# Patient Record
Sex: Male | Born: 1955 | Race: White | Hispanic: No | Marital: Single | Smoking: Never smoker
Health system: Southern US, Community
[De-identification: ages and names within clinical notes are randomized; demographics above are authoritative.]

## PROBLEM LIST (undated history)

## (undated) DIAGNOSIS — I10 Essential (primary) hypertension: Secondary | ICD-10-CM

## (undated) DIAGNOSIS — I2510 Atherosclerotic heart disease of native coronary artery without angina pectoris: Secondary | ICD-10-CM

## (undated) DIAGNOSIS — E119 Type 2 diabetes mellitus without complications: Secondary | ICD-10-CM

## (undated) DIAGNOSIS — E785 Hyperlipidemia, unspecified: Secondary | ICD-10-CM

## (undated) DIAGNOSIS — E669 Obesity, unspecified: Secondary | ICD-10-CM

## (undated) DIAGNOSIS — G20 Parkinson's disease: Secondary | ICD-10-CM

## (undated) DIAGNOSIS — R2681 Unsteadiness on feet: Secondary | ICD-10-CM

## (undated) DIAGNOSIS — R1310 Dysphagia, unspecified: Secondary | ICD-10-CM

## (undated) DIAGNOSIS — Z9181 History of falling: Secondary | ICD-10-CM

## (undated) DIAGNOSIS — R413 Other amnesia: Secondary | ICD-10-CM

## (undated) DIAGNOSIS — R634 Abnormal weight loss: Secondary | ICD-10-CM

## (undated) DIAGNOSIS — E7800 Pure hypercholesterolemia, unspecified: Secondary | ICD-10-CM

## (undated) DIAGNOSIS — F985 Adult onset fluency disorder: Secondary | ICD-10-CM

## (undated) HISTORY — DX: Adult onset fluency disorder: F98.5

## (undated) HISTORY — DX: Pure hypercholesterolemia, unspecified: E78.00

## (undated) HISTORY — DX: Essential (primary) hypertension: I10

## (undated) HISTORY — DX: Other amnesia: R41.3

## (undated) HISTORY — DX: Dysphagia, unspecified: R13.10

## (undated) HISTORY — DX: Atherosclerotic heart disease of native coronary artery without angina pectoris: I25.10

## (undated) HISTORY — DX: Type 2 diabetes mellitus without complications: E11.9

## (undated) HISTORY — DX: Abnormal weight loss: R63.4

## (undated) HISTORY — DX: Parkinson's disease: G20

## (undated) HISTORY — DX: History of falling: Z91.81

## (undated) HISTORY — DX: Unsteadiness on feet: R26.81

## (undated) HISTORY — PX: CARDIAC CATHETERIZATION: SHX172

## (undated) HISTORY — DX: Obesity, unspecified: E66.9

## (undated) HISTORY — DX: Hyperlipidemia, unspecified: E78.5

---

## 2008-09-28 ENCOUNTER — Emergency Department: Admit: 2008-09-28 | Payer: Self-pay | Source: Emergency Department | Admitting: Emergency Medicine

## 2008-09-28 LAB — CBC AND DIFFERENTIAL
Basophils Absolute: 0 /mm3 (ref 0.0–0.2)
Basophils: 0 % (ref 0–2)
Eosinophils Absolute: 0 /mm3 (ref 0.0–0.7)
Eosinophils: 0 % (ref 0–5)
Granulocytes Absolute: 9.8 /mm3 — ABNORMAL HIGH (ref 1.8–8.1)
Hematocrit: 45.1 % (ref 42.0–52.0)
Hgb: 15.5 G/DL (ref 13.0–17.0)
Immature Granulocytes Absolute: 0
Immature Granulocytes: 0 %
Lymphocytes Absolute: 0.5 /mm3 (ref 0.5–4.4)
Lymphocytes: 4 % — ABNORMAL LOW (ref 15–41)
MCH: 29 PG (ref 28.0–32.0)
MCHC: 34.4 G/DL (ref 32.0–36.0)
MCV: 84.5 FL (ref 80.0–100.0)
MPV: 10.9 FL (ref 9.4–12.3)
Monocytes Absolute: 0.6 /mm3 (ref 0.0–1.2)
Monocytes: 5 % (ref 0–11)
Neutrophils %: 90 % — ABNORMAL HIGH (ref 52–75)
Platelets: 297 /mm3 (ref 140–400)
RBC: 5.34 /mm3 (ref 4.70–6.00)
RDW: 13.8 % (ref 11.5–15.0)
WBC: 10.8 /mm3 (ref 3.50–10.80)

## 2008-09-28 LAB — I-STAT CG4 VENOUS CARTRIDGE
Base Excess, Ven: 7 mEq/L
FIO2: 21
HCO3, Ven: 31.3 mEq/L
Lactic Acid: 2 mEq/L (ref 0.5–2.2)
O2 Sat, Venous: 39 %
Temperature: 98.9
Venous Total CO2: 32 mEq/L
pCO2, Venous: 40.4 mmHg
pH, Ven: 7.497
pO2, Venous: 21 mmHg

## 2008-09-28 LAB — BASIC METABOLIC PANEL
BUN: 19 mg/dL (ref 8–20)
CO2: 29 mEq/L (ref 21–30)
Calcium: 9.5 mg/dL (ref 8.6–10.2)
Chloride: 101 mEq/L (ref 98–107)
Creatinine: 1 mg/dL (ref 0.6–1.5)
Glucose: 147 mg/dL — ABNORMAL HIGH (ref 70–100)
Potassium: 3.2 mEq/L — ABNORMAL LOW (ref 3.6–5.0)
Sodium: 142 mEq/L (ref 136–146)

## 2008-09-28 LAB — GFR

## 2008-09-28 LAB — HEPATIC FUNCTION PANEL
ALT: 33 U/L (ref 3–36)
AST (SGOT): 25 U/L (ref 10–41)
Albumin/Globulin Ratio: 1.2 (ref 1.1–1.8)
Albumin: 4.7 g/dL (ref 3.4–4.9)
Alkaline Phosphatase: 105 U/L (ref 43–112)
Bilirubin Direct: 0 mg/dL (ref 0.0–0.3)
Bilirubin Indirect: 1 mg/dL — ABNORMAL HIGH (ref 0.1–0.9)
Bilirubin, Total: 0.7 mg/dL (ref 0.1–1.0)
Globulin: 3.8 g/dL — ABNORMAL HIGH (ref 2.0–3.7)
Protein, Total: 8.5 g/dL — ABNORMAL HIGH (ref 6.0–8.0)

## 2008-09-28 LAB — MAGNESIUM: Magnesium: 2 mg/dL (ref 1.6–2.3)

## 2008-09-28 LAB — LIPASE: Lipase: 82 U/L (ref 32–219)

## 2008-09-28 LAB — AMYLASE: Amylase: 36 U/L (ref 5–90)

## 2008-09-28 LAB — OSMOLALITY: Osmolality: 301 mosm/kg — ABNORMAL HIGH (ref 270–298)

## 2010-09-17 HISTORY — PX: CAROTID STENT: SHX1301

## 2011-06-08 LAB — ECG 12-LEAD
Atrial Rate: 91 {beats}/min
P Axis: 115 degrees
P-R Interval: 158 ms
Q-T Interval: 368 ms
QRS Duration: 104 ms
QTC Calculation (Bezet): 452 ms
R Axis: -28 degrees
T Axis: -24 degrees
Ventricular Rate: 91 {beats}/min

## 2013-02-26 ENCOUNTER — Emergency Department: Payer: BLUE CROSS/BLUE SHIELD

## 2013-02-26 ENCOUNTER — Inpatient Hospital Stay
Admission: EM | Admit: 2013-02-26 | Discharge: 2013-02-28 | DRG: 247 | Disposition: A | Discharge status: Home / Self Care | Payer: BLUE CROSS/BLUE SHIELD | Attending: Hospitalist | Admitting: Hospitalist

## 2013-02-26 ENCOUNTER — Inpatient Hospital Stay: Payer: BLUE CROSS/BLUE SHIELD | Admitting: Internal Medicine

## 2013-02-26 DIAGNOSIS — Z6838 Body mass index (BMI) 38.0-38.9, adult: Secondary | ICD-10-CM

## 2013-02-26 DIAGNOSIS — E785 Hyperlipidemia, unspecified: Secondary | ICD-10-CM | POA: Diagnosis present

## 2013-02-26 DIAGNOSIS — E669 Obesity, unspecified: Secondary | ICD-10-CM | POA: Diagnosis present

## 2013-02-26 DIAGNOSIS — I2510 Atherosclerotic heart disease of native coronary artery without angina pectoris: Secondary | ICD-10-CM | POA: Diagnosis present

## 2013-02-26 DIAGNOSIS — E119 Type 2 diabetes mellitus without complications: Secondary | ICD-10-CM | POA: Diagnosis present

## 2013-02-26 DIAGNOSIS — I214 Non-ST elevation (NSTEMI) myocardial infarction: Principal | ICD-10-CM | POA: Diagnosis present

## 2013-02-26 DIAGNOSIS — I10 Essential (primary) hypertension: Secondary | ICD-10-CM | POA: Diagnosis present

## 2013-02-26 LAB — CBC AND DIFFERENTIAL
Basophils Absolute Automated: 0.02 (ref 0.00–0.20)
Basophils Automated: 0 %
Eosinophils Absolute Automated: 0.14 (ref 0.00–0.70)
Eosinophils Automated: 2 %
Hematocrit: 44.8 % (ref 42.0–52.0)
Hgb: 14.4 g/dL (ref 13.0–17.0)
Immature Granulocytes Absolute: 0.01
Immature Granulocytes: 0 %
Lymphocytes Absolute Automated: 1.98 (ref 0.50–4.40)
Lymphocytes Automated: 28 %
MCH: 28.1 pg (ref 28.0–32.0)
MCHC: 32.1 g/dL (ref 32.0–36.0)
MCV: 87.5 fL (ref 80.0–100.0)
MPV: 11.3 fL (ref 9.4–12.3)
Monocytes Absolute Automated: 0.52 (ref 0.00–1.20)
Monocytes: 7 %
Neutrophils Absolute: 4.36 (ref 1.80–8.10)
Neutrophils: 62 %
Nucleated RBC: 0 (ref 0–1)
Platelets: 225 (ref 140–400)
RBC: 5.12 (ref 4.70–6.00)
RDW: 14 % (ref 12–15)
WBC: 7.03 (ref 3.50–10.80)

## 2013-02-26 LAB — POCT GLUCOSE: Whole Blood Glucose POCT: 157 mg/dL — AB (ref 70–100)

## 2013-02-26 LAB — BASIC METABOLIC PANEL
BUN: 13 mg/dL (ref 9.0–21.0)
CO2: 24 (ref 22–29)
Calcium: 9.1 mg/dL (ref 8.5–10.5)
Chloride: 104 (ref 98–107)
Creatinine: 0.9 mg/dL (ref 0.7–1.3)
Glucose: 274 mg/dL — ABNORMAL HIGH (ref 70–100)
Potassium: 3.8 (ref 3.5–5.1)
Sodium: 137 (ref 136–145)

## 2013-02-26 LAB — GFR: EGFR: >60

## 2013-02-26 LAB — I-STAT TROPONIN: i-STAT Troponin: 0.07 ng/mL (ref 0.00–0.09)

## 2013-02-26 MED ORDER — GLUCOSE 40 % PO GEL
15.0000 g | ORAL | Status: DC | PRN
Start: 2013-02-26 — End: 2013-02-28

## 2013-02-26 MED ORDER — DEXTROSE 50 % IV SOLN
25.0000 mL | INTRAVENOUS | Status: DC | PRN
Start: 2013-02-26 — End: 2013-02-28

## 2013-02-26 MED ORDER — INSULIN ASPART 100 UNIT/ML SC SOLN
1.00 [IU] | Freq: Three times a day (TID) | SUBCUTANEOUS | Status: DC | PRN
Start: 2013-02-26 — End: 2013-02-28
  Administered 2013-02-28: 1 [IU] via SUBCUTANEOUS
  Filled 2013-02-26: qty 10

## 2013-02-26 MED ORDER — DEXTROSE 50 % IV SOLN
25.00 mL | INTRAVENOUS | Status: DC | PRN
Start: 2013-02-26 — End: 2013-02-28

## 2013-02-26 MED ORDER — METOPROLOL TARTRATE 25 MG PO TABS
25.0000 mg | ORAL_TABLET | Freq: Two times a day (BID) | ORAL | Status: DC
Start: 2013-02-26 — End: 2013-02-27
  Administered 2013-02-26 – 2013-02-27 (×2): 25 mg via ORAL
  Filled 2013-02-26 (×2): qty 1

## 2013-02-26 MED ORDER — GLUCAGON HCL (RDNA) 1 MG IJ SOLR
1.0000 mg | INTRAMUSCULAR | Status: DC | PRN
Start: 2013-02-26 — End: 2013-02-28

## 2013-02-26 MED ORDER — LABETALOL HCL 5 MG/ML IV SOLN
20.0000 mg | Freq: Once | INTRAVENOUS | Status: DC
Start: 2013-02-26 — End: 2013-02-26

## 2013-02-26 MED ORDER — SODIUM CHLORIDE 0.9 % IJ SOLN
3.0000 mL | Freq: Three times a day (TID) | INTRAMUSCULAR | Status: DC
Start: 2013-02-26 — End: 2013-02-28
  Administered 2013-02-26 – 2013-02-28 (×6): 3 mL via INTRAVENOUS

## 2013-02-26 MED ORDER — LABETALOL HCL 5 MG/ML IV SOLN
10.0000 mg | Freq: Once | INTRAVENOUS | Status: AC
Start: 2013-02-26 — End: 2013-02-26
  Administered 2013-02-26: 10 mg via INTRAVENOUS
  Filled 2013-02-26: qty 20

## 2013-02-26 MED ORDER — SODIUM CHLORIDE 0.9 % IV SOLN
INTRAVENOUS | Status: DC
Start: 2013-02-27 — End: 2013-02-28

## 2013-02-26 MED ORDER — OXYCODONE-ACETAMINOPHEN 5-325 MG PO TABS
2.00 | ORAL_TABLET | ORAL | Status: DC | PRN
Start: 2013-02-26 — End: 2013-02-28

## 2013-02-26 MED ORDER — LABETALOL HCL 5 MG/ML IV SOLN
10.0000 mg | Freq: Once | INTRAVENOUS | Status: AC
Start: 2013-02-26 — End: 2013-02-26
  Administered 2013-02-26: 10 mg via INTRAVENOUS

## 2013-02-26 MED ORDER — PANTOPRAZOLE SODIUM 40 MG PO TBEC
40.0000 mg | DELAYED_RELEASE_TABLET | Freq: Every morning | ORAL | Status: DC
Start: 2013-02-27 — End: 2013-02-28
  Administered 2013-02-27 – 2013-02-28 (×2): 40 mg via ORAL
  Filled 2013-02-26 (×2): qty 1

## 2013-02-26 MED ORDER — NITROGLYCERIN 0.4 MG SL SUBL
0.4000 mg | SUBLINGUAL_TABLET | SUBLINGUAL | Status: DC | PRN
Start: 2013-02-26 — End: 2013-02-28

## 2013-02-26 MED ORDER — ACETAMINOPHEN 325 MG PO TABS
650.0000 mg | ORAL_TABLET | ORAL | Status: DC | PRN
Start: 2013-02-26 — End: 2013-02-28
  Administered 2013-02-27: 650 mg via ORAL
  Filled 2013-02-26: qty 2

## 2013-02-26 MED ORDER — HYDRALAZINE HCL 20 MG/ML IJ SOLN
10.0000 mg | INTRAMUSCULAR | Status: DC | PRN
Start: 2013-02-26 — End: 2013-02-27

## 2013-02-26 MED ORDER — ENOXAPARIN SODIUM 40 MG/0.4ML SC SOLN
40.0000 mg | Freq: Every day | SUBCUTANEOUS | Status: DC
Start: 2013-02-26 — End: 2013-02-27
  Administered 2013-02-26: 40 mg via SUBCUTANEOUS
  Filled 2013-02-26: qty 0.4

## 2013-02-26 MED ORDER — INSULIN ASPART 100 UNIT/ML SC SOLN
1.0000 [IU] | Freq: Three times a day (TID) | SUBCUTANEOUS | Status: DC | PRN
Start: 2013-02-26 — End: 2013-02-26

## 2013-02-26 NOTE — ED Notes (Signed)
Bed:N 42<BR> Expected date:<BR> Expected time:<BR> Means of arrival:[id number] [place]<BR> Comments:<BR>

## 2013-02-26 NOTE — ED Provider Notes (Signed)
Physician/Midlevel provider first contact with patient: 02/26/13 1636         Physician/Midlevel provider first contact with patient: 4:48 PM    Chief Complaint  Chief Complaint   Patient presents with   . Chest Pain   . Shortness of Breath       Diagnosis   1. Chest pain        Disposition   ED Disposition     Admit Bed Type: Telemetry [5]  Admitting Physician: Candiss Norse [[id number]]  Patient Class: Hospital Outpatient Surgery (Amb Proc) [106]             Meds administered in ER:     Medications   labetalol (NORMODYNE,TRANDATE) injection 10 mg (10 mg Intravenous Given 02/26/13 1824)   labetalol (NORMODYNE,TRANDATE) injection 10 mg (10 mg Intravenous Given 02/26/13 1857)     New Prescriptions    No medications on file      __________________________________________________   HPI   William Donaldson is a 57 y.o. male h/o HTN, borderline DM, BIBA c/o mid-sternal chest pressure onset approx 1430 today when pt woke up from a nap. Pt mentions assoc "clammy" feeling, dry mouth, and an ache in his upper arms bilaterally. EMS administered 2x 81mg  ASA and pt is pain free now. Denies fever, cough, N/V, diaphoresis, and any other complaints.    PMD   Ival Bible, MD    ROS   -Documented in HPI. Otherwise all other systems reviewed and negative.   -Nursing notes reviewed by me.     BP 185/75  Pulse 72  Temp 97.9 F (36.6 C)  Resp 18  Ht 1.803 m  Wt 124.739 kg  BMI 38.37 kg/m2  SpO2 97%   Current/Home Medications    No medications on file     Past Medical History   Diagnosis Date   . Hypertension      never put on meds   . Diabetes mellitus      borderline. Told he did not need meds if he lost weight.       History reviewed. No pertinent past surgical history.   History reviewed. No pertinent family history.   History     Social History   . Marital Status: Single     Spouse Name: N/A     Number of Children: N/A   . Years of Education: N/A     Social History Main Topics   . Smoking status: Not on file   .  Smokeless tobacco: Not on file   . Alcohol Use:    . Drug Use:    . Sexually Active: Not on file     Other Topics Concern   . Not on file     Social History Narrative   . No narrative on file     Labs:   Labs Reviewed   BASIC METABOLIC PANEL - Abnormal; Notable for the following:     Glucose 274 (*)      All other components within normal limits   CBC AND DIFFERENTIAL   GFR   I-STAT TROPONIN     Radiology Results (24 Hour)     Procedure Component Value Units Date/Time    Chest AP Portable [[id number]] Collected:02/26/13 1721    Order Status:Completed  Updated:02/26/13 1726    Narrative:    HISTORY:  57 year old male with chest pain    COMPARISON: 09/28/2008    FINDINGS:  Portable upright AP view of the chest was obtained.  The  cardiac silhouette is borderline enlarged. The mediastinum and pulmonary  vasculature are within normal limits. No consolidation or pleural  effusion is identified. Hila are unremarkable.      Impression:     Borderline cardiomegaly. Negative for acute process.    Gerlene Burdock, MD   02/26/2013 5:22 PM          PE   Filed Vitals:    02/26/13 1820 02/26/13 1831 02/26/13 1855 02/26/13 1902   BP: 217/108 183/86 188/88 185/75   Pulse: 78 72 73 72   Temp:       Resp: 18 18 18 18    Height:       Weight:       SpO2: 97% 96% 98% 97%      Pulse Oximetry Analysis - Normal   Vitals reviewed   GEN: . Nontoxic, Well appearing, NAD.     Head: NC/AT.     Eyes: EOMI w/o pain, PERRL, nl conjunctiva, no pallor or icterus. NO d/c.    ENT: MMM, symmetric OP, no drooling/trismus.    Neck: FROM, supple, no masses, no tenderness, no cervical lymphadenopathy.  Chest: ctab, NO resp distress, no crackles or wheezing. Equal chest rise.   CV: rrr, NO significant murmur appreciated.     Abd: no peritoneal signs, soft, no distention, no tenderness, obese abdomen    Back: NO CVA tenderness, NO midline ttp.     UpperExt: NO deformity. FROM, neurovasc intact.       LowerExt: NO edema. FROM, neurovasc intact.        Neuro: CN II-XII  intact, moving all ext equally, no motor deficits, normal gait.     Skin: Warm and dry. NO rash.        Psych: Normal affect. Normal insight.        EKG   NSR rate 72, normal axis, normal intervals, possible L ventricular hypertrophy, TWI leads III and aVF, no ST elevation, no blocks, no significant change from prior EKG    Rhythm Strip Interpretation    Sinus rhythm, rate 60-80, no blocks, no ectopy, no ischemic signs    Critical Care Time   none    MDM / ED Course   Differential Diagnosis (not completely inclusive): ACS, hypertensive emergency vs. Urgency, NSTEMI, unstable angina    57 yo M with borderline HTN and DM, not on medications, presents for SSCP with pressure, diaphoresis and SOB after awakening from a nap this afternoon.  Radiated to both arms, with his risk factors and uncontrolled htn on presentation, plan to admit for serial enzymes, consideration of additional stress. He reports that he had a stress a couple of years ago, reports that it did not show any blockages.  BP improved after two pushes of IV labetalol, came down to the mid 180s systolic, discussed with Dr. Penni Homans for admission to cardiac tele, as given his persistent HBP, will not plan to place in the observation unit.  Comfortable, discussed plan with patient, aware of plan for admission.       Laboratory results reviewed by EDP: YES   Radiologic study results reviewed by EDP: YES   Radiologic Studies Interpreted (viewed) by EDP: YES     I was acting as a scribe for Artis Flock, MD on Milazzo,William Donaldson  Treatment Team: Scribe: Carin Hock   I am the first provider for this patient and I personally performed the services documented. Treatment Team: Scribe: Carin Hock is scribing for me on  Zullo,William Donaldson. This note accurately reflects work and decisions made by me.  Artis Flock, MD    Hal Hope, MD  _________________________________________________________________               Artis Flock,  MD  02/26/13 2055

## 2013-02-26 NOTE — ED Notes (Signed)
C/o chest tightness intermittently since 1430 with intermittent sob. States both hands got tingly. No chest pain at all now.

## 2013-02-26 NOTE — H&P (Signed)
ADMISSION HISTORY AND PHYSICAL EXAM    Date Time: 02/26/2013 9:47 PM  Patient Name: William Donaldson  Attending Physician: Vernell Barrier, MD  Primary Care Physician: Ival Bible, MD    CC: Chest pressure and SOB      Assessment:   Principal Problem:   *Chest pressure  Active Problems:   SOB (shortness of breath)   Elevated blood-pressure reading without diagnosis of hypertension   Hyperglycemia   Hyperlipidemia   Obesity            Plan:   *Admit to Observation status  *CArdiac enzymes x 3, repeat EKG in am  *Lipid profile and HgbA1C  *BP control  *Monitor BG  *Cardiology Consult ([facility])      Disposition:     Anticipated medical stability for discharge: June,  13 - Afternoon  Service status: Observation:patient is stable and improving.  Reason for ongoing hospitalization: RO ACS  Anticipated discharge needs: None      History of Presenting Illness:   William Donaldson is a 58 Tanikka Bresnan.o. male who presents to the hospital with chest pressure which awakened him from sleep.  Patient is 5 foot 11 in tall and weighs 275 lbs.He does not exercise and is not physically active. He has a sedentary job,he does not smoke and does not drink in excess. One year ago he had a physical and was given prescriptions for a BP and a lipid-lowering medication. He stopped taking medications after he had used three refills. He had a stress test in August 2013 which was normal. He denies having chest pain in the past.  Patient was on his last day of vacation today and took a nap after eating a rich lunch. He was awakened from his nap with substernal pressure, associated with numbness in the shoulder and shortness of breath. All symptoms waxed and waned over the next hour and he called EMS. He was symptom free on arrival in ED. Temperature was normal, p 75,R 20 and BP 223/101. He received Labetalol 10 mg IV x 2 and the current BP in ED is 188/82 with a HR of 80  EKG showed criteria of LVH, CXR showed cardiomegaly, Troponin was  0.07. WBC was 7.03,H&H 14.4/44.8, Glucose 274, BUN/Creat 13/0.9, Electrolytes were normal  Patient denies headache. He felt lightheaded when he sat up earlier. He denies visual or ENT symptoms. He denies SOB at time of exam , and previously. He denies N and V., prior episodes of abdominal pain, GE reflux, bladder problems and leg edema.  Past Medical History:     Past Medical History   Diagnosis Date   . Hypertension      never put on meds   . Diabetes mellitus      borderline. Told he did not need meds if he lost weight.        Available old records reviewed, including:  No records in Epic    Past Surgical History:   History reviewed. No pertinent past surgical history.  Father died at age 95, mother died at 33 with cancer.  Family History:   History reviewed. No pertinent family history.    Social History:     History   Smoking status   . Not on file   Smokeless tobacco   . Not on file     History   Alcohol Use      History   Drug Use        Allergies:  No Known Allergies    Medications:     Current/Home Medications    No medications on file        Method by which medications were confirmed on admission:Patient does not take medications    Review of Systems:   All other systems were reviewed and are negative except as noted    Physical Exam:   Patient Vitals for the past 24 hrs:   BP Temp Pulse Resp SpO2 Height Weight   02/26/13 2123 188/82 mmHg - 80  - 97 % - -   02/26/13 2013 162/75 mmHg - 70  - 96 % - -   02/26/13 1923 183/87 mmHg - 68  - 97 % - -   02/26/13 1903 183/85 mmHg - 71  18  97 % - -   02/26/13 1902 185/75 mmHg - 72  18  97 % - -   02/26/13 1855 188/88 mmHg - 73  18  98 % - -   02/26/13 1831 183/86 mmHg - 72  18  96 % - -   02/26/13 1820 217/108 mmHg - 78  18  97 % - -   02/26/13 1655 234/105 mmHg - 72  18  98 % - -   02/26/13 1635 223/101 mmHg 97.9 F (36.6 C) 75  20  95 % 1.803 m (5\' 11" ) 124.739 kg (275 lb)     Body mass index is 38.37 kg/(m^2).  No intake or output data in the 24 hours ending  02/26/13 2147    General: awake, alert, oriented x 3; no acute distress.  HEENT: perrla, eomi, sclera anicteric  oropharynx clear without lesions, mucous membranes moist  Neck: supple, no lymphadenopathy, no thyromegaly, no JVD, no carotid bruits  Cardiovascular: regular rate and rhythm, no murmurs, rubs or gallops  Lungs: clear to auscultation bilaterally, without wheezing, rhonchi, or rales  Abdomen: soft, non-tender, non-distended; no palpable masses, no hepatosplenomegaly, normoactive bowel sounds, no rebound or guarding  Extremities: no clubbing, cyanosis, or edema  Neuro: cranial nerves grossly intact,   Skin: no rashes or lesions noted        Labs:     Results     Procedure Component Value Units Date/Time    Basic Metabolic Panel (BMP) [[id number]]  (Abnormal) Collected:02/26/13 1647    Specimen Information:Blood Updated:02/26/13 1758     Glucose 274 (H) mg/dL      BUN 78.2 mg/dL      Creatinine 0.9 mg/dL      CALCIUM 9.1 mg/dL      Sodium 956      Potassium 3.8      Chloride 104      CO2 24     GFR [[id number]] Collected:02/26/13 1647     EGFR >60.0 Updated:02/26/13 1758    CBC with Differential [[id number]] Collected:02/26/13 1647    Specimen Information:Blood / Blood Updated:02/26/13 1746     WBC 7.03      RBC 5.12      Hgb 14.4 g/dL      Hematocrit 28.4 %      MCV 87.5 fL      MCH 28.1 pg      MCHC 32.1 g/dL      RDW 14 %      Platelets 225      MPV 11.3 fL      Neutrophils 62 %      Lymphocytes Automated 28 %      Monocytes 7 %  Eosinophils Automated 2 %      Basophils Automated 0 %      Immature Granulocyte 0 %      Nucleated RBC 0      Neutrophils Absolute 4.36      Abs Lymph Automated 1.98      Abs Mono Automated 0.52      Abs Eos Automated 0.14      Absolute Baso Automated 0.02      Absolute Immature Granulocyte 0.01     i-Stat Troponin [[id number]] Collected:02/26/13 1641     i-STAT Troponin 0.07 ng/mL Updated:02/26/13 1654          Imaging personally reviewed, including:CXR- which shows  cardiomegaly    Safety Checklist  DVT prophylaxis:  CHEST guideline (See page e199S) Chemical and Mechanical   Foley:  [place] Rn Foley protocol Not present   IVs:  Peripheral IV   PT/OT: Not needed   Daily CBC & or Chem ordered:  SHM/ABIM guidelines (see #5) Yes, due to clinical and lab instability   Reference for approximate charges of common labs: CBC auto diff - $76  BMP - $99  Mg - $79    Signed by: Vernell Barrier, MD   VW:[id number], Ernest Mallick, MD

## 2013-02-26 NOTE — ED Notes (Addendum)
Troponin 0.07; Dr Rebekah Chesterfield

## 2013-02-27 ENCOUNTER — Inpatient Hospital Stay: Payer: BLUE CROSS/BLUE SHIELD

## 2013-02-27 ENCOUNTER — Encounter
Admission: EM | Disposition: A | Discharge status: Home / Self Care | Payer: Self-pay | Source: Home / Self Care | Attending: Hospitalist

## 2013-02-27 LAB — ECG 12-LEAD
Atrial Rate: 72 {beats}/min
Atrial Rate: 62 {beats}/min
Atrial Rate: 67 {beats}/min
P Axis: 51 degrees
P Axis: -21 degrees
P Axis: -3 degrees
P-R Interval: 170 ms
P-R Interval: 180 ms
P-R Interval: 182 ms
Q-T Interval: 396 ms
Q-T Interval: 396 ms
Q-T Interval: 438 ms
QRS Duration: 98 ms
QRS Duration: 100 ms
QRS Duration: 96 ms
QTC Calculation (Bezet): 433 ms
QTC Calculation (Bezet): 401 ms
QTC Calculation (Bezet): 462 ms
R Axis: -7 degrees
R Axis: -4 degrees
R Axis: 7 degrees
T Axis: -17 degrees
T Axis: -16 degrees
T Axis: -26 degrees
Ventricular Rate: 72 {beats}/min
Ventricular Rate: 62 {beats}/min
Ventricular Rate: 67 {beats}/min

## 2013-02-27 LAB — CK: Creatine Kinase (CK): 146 U/L (ref 30–200)

## 2013-02-27 LAB — TROPONIN I
Troponin I: 0.88 ng/mL — ABNORMAL HIGH (ref 0.00–0.09)
Troponin I: 1.51 ng/mL — ABNORMAL HIGH (ref 0.00–0.09)
Troponin I: 1.26 ng/mL — ABNORMAL HIGH (ref 0.00–0.09)

## 2013-02-27 LAB — CBC
Hematocrit: 44.9 % (ref 42.0–52.0)
Hgb: 14.6 g/dL (ref 13.0–17.0)
MCH: 28.6 pg (ref 28.0–32.0)
MCHC: 32.5 g/dL (ref 32.0–36.0)
MCV: 88 fL (ref 80.0–100.0)
MPV: 10.6 fL (ref 9.4–12.3)
Nucleated RBC: 0 (ref 0–1)
Platelets: 228 (ref 140–400)
RBC: 5.1 (ref 4.70–6.00)
RDW: 14 % (ref 12–15)
WBC: 8.54 (ref 3.50–10.80)

## 2013-02-27 LAB — POCT GLUCOSE
Whole Blood Glucose POCT: 155 mg/dL — AB (ref 70–100)
Whole Blood Glucose POCT: 135 mg/dL — AB (ref 70–100)
Whole Blood Glucose POCT: 112 mg/dL — AB (ref 70–100)
Whole Blood Glucose POCT: 147 mg/dL — AB (ref 70–100)

## 2013-02-27 LAB — BASIC METABOLIC PANEL
BUN: 9 mg/dL (ref 9.0–21.0)
CO2: 22 (ref 22–29)
Calcium: 8.7 mg/dL (ref 8.5–10.5)
Chloride: 106 (ref 98–107)
Creatinine: 0.8 mg/dL (ref 0.7–1.3)
Glucose: 146 mg/dL — ABNORMAL HIGH (ref 70–100)
Potassium: 3.7 (ref 3.5–5.1)
Sodium: 138 (ref 136–145)

## 2013-02-27 LAB — LIPID PANEL
Cholesterol / HDL Ratio: 5.9
Cholesterol: 159 mg/dL (ref 0–199)
HDL: 27 mg/dL — ABNORMAL LOW (ref >40)
LDL Calculated: 83 mg/dL (ref 0–99)
Triglycerides: 246 mg/dL — ABNORMAL HIGH (ref 34–149)
VLDL Calculated: 49 mg/dL — ABNORMAL HIGH (ref 10–40)

## 2013-02-27 LAB — GFR: EGFR: >60

## 2013-02-27 LAB — HEMOLYSIS INDEX: Hemolysis Index: 28 — ABNORMAL HIGH (ref 0–9)

## 2013-02-27 LAB — HEMOGLOBIN A1C: Hemoglobin A1C: 7.2 % — ABNORMAL HIGH (ref 0.0–6.0)

## 2013-02-27 LAB — MAGNESIUM: Magnesium: 2.3 mg/dL (ref 1.6–2.6)

## 2013-02-27 LAB — TSH: TSH: 3.4 (ref 0.35–4.94)

## 2013-02-27 SURGERY — LEFT HEART CATH POSS PCI
Anesthesia: Conscious Sedation | Laterality: Left

## 2013-02-27 MED ORDER — ENOXAPARIN SODIUM 80 MG/0.8ML SC SOLN
80.00 mg | Freq: Once | SUBCUTANEOUS | Status: AC
Start: 2013-02-27 — End: 2013-02-27
  Administered 2013-02-27: 80 mg via SUBCUTANEOUS
  Filled 2013-02-27: qty 0.8

## 2013-02-27 MED ORDER — IODIXANOL 320 MG/ML IV SOLN
200.0000 mL | Freq: Once | INTRAVENOUS | Status: AC | PRN
Start: 2013-02-27 — End: 2013-02-27
  Administered 2013-02-27: 200 mL via INTRA_ARTERIAL

## 2013-02-27 MED ORDER — MIDAZOLAM HCL 2 MG/2ML IJ SOLN
INTRAMUSCULAR | Status: AC
Start: 2013-02-27 — End: 2013-02-27
  Administered 2013-02-27: 2 mg via INTRAVENOUS
  Filled 2013-02-27: qty 2

## 2013-02-27 MED ORDER — ZOLPIDEM TARTRATE 5 MG PO TABS
5.0000 mg | ORAL_TABLET | Freq: Every evening | ORAL | Status: DC | PRN
Start: 2013-02-27 — End: 2013-02-28
  Administered 2013-02-28: 5 mg via ORAL
  Filled 2013-02-27: qty 1

## 2013-02-27 MED ORDER — ALPRAZOLAM 0.25 MG PO TABS
0.2500 mg | ORAL_TABLET | Freq: Four times a day (QID) | ORAL | Status: DC | PRN
Start: 2013-02-27 — End: 2013-02-28

## 2013-02-27 MED ORDER — ATORVASTATIN CALCIUM 40 MG PO TABS
40.00 mg | ORAL_TABLET | Freq: Every day | ORAL | Status: DC
Start: 2013-02-27 — End: 2013-02-27
  Administered 2013-02-27 (×2): 40 mg via ORAL
  Filled 2013-02-27 (×2): qty 1

## 2013-02-27 MED ORDER — HEPARIN WASH BOWL 5 UNITS/ML SOLN (CATH LAB)
Status: AC
Start: 2013-02-27 — End: 2013-02-27
  Filled 2013-02-27: qty 2

## 2013-02-27 MED ORDER — ATORVASTATIN CALCIUM 80 MG PO TABS
80.00 mg | ORAL_TABLET | Freq: Every day | ORAL | Status: DC
Start: 2013-02-28 — End: 2013-02-28
  Administered 2013-02-28: 80 mg via ORAL
  Filled 2013-02-27: qty 1

## 2013-02-27 MED ORDER — BIVALIRUDIN 250 MG IN 50 ML NS (CNR)
Status: AC
Start: 2013-02-27 — End: 2013-02-27
  Administered 2013-02-27: 1.75 mg/kg/h via INTRAVENOUS
  Filled 2013-02-27: qty 50

## 2013-02-27 MED ORDER — PRASUGREL HCL 10 MG PO TABS
ORAL_TABLET | ORAL | Status: AC
Start: 2013-02-27 — End: 2013-02-27
  Administered 2013-02-27: 60 mg via ORAL
  Filled 2013-02-27: qty 6

## 2013-02-27 MED ORDER — OXYCODONE-ACETAMINOPHEN 5-325 MG PO TABS
2.0000 | ORAL_TABLET | ORAL | Status: DC | PRN
Start: 2013-02-27 — End: 2013-02-28

## 2013-02-27 MED ORDER — NITROGLYCERIN IN D5W 200-5 MCG/ML-% IV SOLN
30.00 ug/min | INTRAVENOUS | Status: AC
Start: 2013-02-27 — End: 2013-02-28

## 2013-02-27 MED ORDER — SODIUM CHLORIDE 0.9 % IV SOLN
INTRAVENOUS | Status: AC
Start: 2013-02-27 — End: 2013-02-28

## 2013-02-27 MED ORDER — FENTANYL CITRATE 0.05 MG/ML IJ SOLN
INTRAMUSCULAR | Status: AC
Start: 2013-02-27 — End: 2013-02-27
  Administered 2013-02-27: 100 ug via INTRAVENOUS
  Filled 2013-02-27: qty 2

## 2013-02-27 MED ORDER — FAMOTIDINE 20 MG PO TABS
20.0000 mg | ORAL_TABLET | Freq: Two times a day (BID) | ORAL | Status: DC
Start: 2013-02-27 — End: 2013-02-28
  Administered 2013-02-27 – 2013-02-28 (×3): 20 mg via ORAL
  Filled 2013-02-27 (×3): qty 1

## 2013-02-27 MED ORDER — METOPROLOL TARTRATE 25 MG PO TABS
25.0000 mg | ORAL_TABLET | Freq: Once | ORAL | Status: DC
Start: 2013-02-27 — End: 2013-02-28
  Filled 2013-02-27: qty 1

## 2013-02-27 MED ORDER — ASPIRIN 81 MG PO CHEW
81.0000 mg | CHEWABLE_TABLET | Freq: Every day | ORAL | Status: DC
Start: 2013-02-27 — End: 2013-02-27

## 2013-02-27 MED ORDER — ASPIRIN 325 MG PO TBEC
325.0000 mg | DELAYED_RELEASE_TABLET | Freq: Every day | ORAL | Status: DC
Start: 2013-02-27 — End: 2013-02-28
  Administered 2013-02-27 – 2013-02-28 (×2): 325 mg via ORAL
  Filled 2013-02-27 (×2): qty 1

## 2013-02-27 MED ORDER — NITROGLYCERIN 0.2 MG/HR TD PT24
1.0000 | MEDICATED_PATCH | TRANSDERMAL | Status: DC
Start: 2013-02-27 — End: 2013-02-28
  Administered 2013-02-27: 1 via TRANSDERMAL
  Filled 2013-02-27 (×3): qty 1

## 2013-02-27 MED ORDER — ENOXAPARIN SODIUM 150 MG/ML SC SOLN
1.00 mg/kg | Freq: Two times a day (BID) | SUBCUTANEOUS | Status: DC
Start: 2013-02-27 — End: 2013-02-27
  Filled 2013-02-27 (×2): qty 1

## 2013-02-27 MED ORDER — PRASUGREL HCL 10 MG PO TABS
10.0000 mg | ORAL_TABLET | Freq: Every day | ORAL | Status: DC
Start: 2013-02-28 — End: 2013-02-28
  Administered 2013-02-28: 10 mg via ORAL
  Filled 2013-02-27: qty 1

## 2013-02-27 MED ORDER — LISINOPRIL 5 MG PO TABS
5.00 mg | ORAL_TABLET | Freq: Every day | ORAL | Status: DC
Start: 2013-02-27 — End: 2013-02-28
  Administered 2013-02-27 – 2013-02-28 (×2): 5 mg via ORAL
  Filled 2013-02-27 (×2): qty 1

## 2013-02-27 MED ORDER — SODIUM CHLORIDE 0.9 % IV SOLN
INTRAVENOUS | Status: DC
Start: 2013-02-27 — End: 2013-02-27

## 2013-02-27 MED ORDER — NITROGLYCERIN IN D5W 200-5 MCG/ML-% IV SOLN
INTRAVENOUS | Status: AC
Start: 2013-02-27 — End: 2013-02-27
  Administered 2013-02-27: 30 ug/min via INTRAVENOUS
  Filled 2013-02-27: qty 250

## 2013-02-27 MED ORDER — METOPROLOL TARTRATE 25 MG PO TABS
50.00 mg | ORAL_TABLET | Freq: Two times a day (BID) | ORAL | Status: DC
Start: 2013-02-27 — End: 2013-02-28
  Administered 2013-02-27 – 2013-02-28 (×2): 50 mg via ORAL
  Filled 2013-02-27: qty 2

## 2013-02-27 MED ORDER — LIDOCAINE HCL (PF) 1 % IJ SOLN
INTRAMUSCULAR | Status: AC
Start: 2013-02-27 — End: 2013-02-27
  Administered 2013-02-27: 6 mL via SUBCUTANEOUS
  Filled 2013-02-27: qty 30

## 2013-02-27 NOTE — Plan of Care (Addendum)
S/p cath with stent to LAD, PT denies pain and SOB. he is awake continues on bedrest until 9pm, vital signs stable. SR on the monitor, right groin is CDI, no bleeding or hematoma noted, distal pulses are palpable. pt reminded  to keep leg straight until bedrest is over. Family at bedside, Blood pressure is better, SBP in the 140's. nitro gtt was started in the cath lab, infusing @30mcg /min,  turn off time will be @0440  tomomrrow, 6/14. Will continue to monitor vital signs and neurovascular status.

## 2013-02-27 NOTE — Progress Notes (Addendum)
Medicine Progress Note    Date Time: 02/27/2013 10:47 AM  Patient Name: William Donaldson  Attending Physician: Miki Kins, MD    Assessment & Plan    Assessment:     Patient Active Problem List    Diagnosis Date Noted   . Chest pain 02/27/2013   . Chest pressure 02/26/2013   . SOB (shortness of breath) 02/26/2013   . Elevated blood-pressure reading without diagnosis of hypertension 02/26/2013   . Hyperglycemia 02/26/2013   . Hyperlipidemia 02/26/2013   . Obesity 02/26/2013       57 y/o w/ DM, Hyperlipidemia, presents w/ NSTEMI    Plan:   1) CAD /NSTEMI - d/w cardiology, cardiac cath today.  High dose lipitor, liinopril, metopolol.  Got a dose of lovenox previously, which has been held for cath.  Check echo.    2) DM - HGB a1c 7.2% and BG random >200, which means patient meets criteria for DM.  Will arrange DM education, needs to start metformin at D/C    3) HLP - high dose statin        Safety Checklist:     DVT prophylaxis:  CHEST guideline (See page e199S) Chemical   Foley: Not present   IVs:  Peripheral IV   PT/OT: Not needed   Daily CBC & or Chem ordered:  SHM/ABIM guidelines (see #5) No   Reference for charges of common labs: CBC auto diff - $76  BMP - $99  Mg - $79      Lines:     Active PICC Line / CVC Line / PIV Line / Drain / Airway / Intraosseous Line / Epidural Line / ART Line / Line / Wound / Pressure Ulcer / NG/OG Tube     Name   Placement date   Placement time   Site   Days    Peripheral IV 02/26/13 Left Hand  02/26/13   1642   Hand   less than 1          Disposition:     Today's date: 02/27/2013  Anticipated medical stability for discharge: June,  14 - Afternoon  Reason for ongoing hospitalization: NSTEMI  Anticipated discharge needs: none        Signed by: Miki Kins, MD            CC:  cp    Subjective:    Patient c/o CP yesterday prior to admission, but none since.      Medications     Current Facility-Administered Medications   Medication Dose Route Frequency   . aspirin  81 mg  Oral Daily   . atorvastatin  40 mg Oral Daily   . [COMPLETED] enoxaparin  80 mg Subcutaneous Once   . [COMPLETED] labetalol  10 mg Intravenous Once   . [COMPLETED] labetalol  10 mg Intravenous Once   . lisinopril  5 mg Oral Daily   . metoprolol  25 mg Oral Once   . metoprolol  50 mg Oral Q12H SCH   . nitroglycerin  1 patch Transdermal Q24H   . pantoprazole  40 mg Oral QAM AC   . sodium chloride (PF)  3 mL Intravenous Q8H   . [DISCONTINUED] enoxaparin  1 mg/kg Subcutaneous Q12H SCH   . [DISCONTINUED] enoxaparin  40 mg Subcutaneous Daily   . [DISCONTINUED] labetalol  20 mg Intravenous Once   . [DISCONTINUED] metoprolol  25 mg Oral Q12H [facility]          Physical  Exam   Patient Vitals for the past 24 hrs:   BP Temp Pulse Resp SpO2 Height Weight   02/27/13 0855 160/88 mmHg - 69  - - - -   02/27/13 0802 193/90 mmHg 97.8 F (36.6 C) 69  18  96 % - -   02/27/13 0434 156/86 mmHg 97 F (36.1 C) 66  18  96 % - -   02/26/13 2220 173/79 mmHg 97.8 F (36.6 C) 72  20  95 % 1.803 m (5\' 11" ) 126.281 kg (278 lb 6.4 oz)   02/26/13 2123 188/82 mmHg - 80  - 97 % - -   02/26/13 2013 162/75 mmHg - 70  - 96 % - -   02/26/13 1923 183/87 mmHg - 68  - 97 % - -   02/26/13 1903 183/85 mmHg - 71  18  97 % - -   02/26/13 1902 185/75 mmHg - 72  18  97 % - -   02/26/13 1855 188/88 mmHg - 73  18  98 % - -   02/26/13 1831 183/86 mmHg - 72  18  96 % - -   02/26/13 1820 217/108 mmHg - 78  18  97 % - -   02/26/13 1655 234/105 mmHg - 72  18  98 % - -   02/26/13 1635 223/101 mmHg 97.9 F (36.6 C) 75  20  95 % 1.803 m (5\' 11" ) 124.739 kg (275 lb)     Body mass index is 38.85 kg/(m^2).  Intake and Output Summary (Last 24 hours) at Date Time  No intake or output data in the 24 hours ending 02/27/13 1047    General: awake, alert, oriented x 3; no acute distress.  Cardiovascular: regular rate and rhythm, no murmurs/rubs/gallops  Lungs: clear to auscultation bilaterally anteriorly, without wheezing/rales/rhonchi  Abdomen: soft, non-tender, non-distended;  normoactive bowel sounds, no rebound or guarding  Extremities: no clubbing/cyanosis/edema    Labs & Imaging     Results     Procedure Component Value Units Date/Time    POCT glucose (AC and HS) [[id number]]  (Abnormal) Collected:02/27/13 0722     POCT Glucose WB 155 (A) mg/dL [id number]:14/78/29 5621    Basic metabolic panel [[id number]]  (Abnormal) Collected:02/27/13 0515    Specimen Information:Blood Updated:02/27/13 0557     Glucose 146 (H) mg/dL      BUN 9.0 mg/dL      Creatinine 0.8 mg/dL      CALCIUM 8.7 mg/dL      Sodium 962      Potassium 3.7      Chloride 106      CO2 22     Narrative:    If not ordered in ED    GFR [[id number]] Collected:02/27/13 0515     EGFR >60.0 Updated:02/27/13 0557    Narrative:    If not ordered in ED    Troponin I [[id number]]  (Abnormal) Collected:02/27/13 0515    Specimen Information:Blood Updated:02/27/13 0557     Troponin I 1.51 (H) ng/mL     CBC [[id number]] Collected:02/27/13 0515    Specimen Information:Blood / Blood Updated:02/27/13 0540     WBC 8.54      RBC 5.10      Hgb 14.6 g/dL      Hematocrit 25.9 %      MCV 88.0 fL      MCH 28.6 pg      MCHC 32.5 g/dL      RDW 14 %  Platelets 228      MPV 10.6 fL      Nucleated RBC 0     Narrative:    If not ordered in ED    Lipid panel (Fasting) [[id number]]  (Abnormal) Collected:02/26/13 2325    Specimen Information:Blood Updated:02/27/13 0321     Cholesterol 159 mg/dL      Triglycerides 416 (H) mg/dL      HDL 27 (L) mg/dL      LDL Calculated 83 mg/dL      VLDL Cholesterol Cal 49 (H) mg/dL      CHOL/HDL Ratio 5.9     Hemolyzed Index [[id number]]  (Abnormal) Collected:02/26/13 2325     Hemolyzed Index 28 (H) Updated:02/27/13 0321    Hemoglobin A1C [[id number]]  (Abnormal) Collected:02/26/13 2325     Hemoglobin A1C 7.2 (H) % Updated:02/27/13 0136    Troponin I [[id number]]  (Abnormal) Collected:02/26/13 2325    Specimen Information:Blood Updated:02/27/13 0003     Troponin I 0.88 (H) ng/mL     POCT glucose (AC and HS) [[id number]]  (Abnormal)  Collected:02/26/13 2335    Specimen Information:Blood Updated:02/26/13 2335     POCT Glucose WB 157 (A) mg/dL     Basic Metabolic Panel (BMP) [[id number]]  (Abnormal) Collected:02/26/13 1647    Specimen Information:Blood Updated:02/26/13 1758     Glucose 274 (H) mg/dL      BUN 37.1 mg/dL      Creatinine 0.9 mg/dL      CALCIUM 9.1 mg/dL      Sodium 062      Potassium 3.8      Chloride 104      CO2 24     GFR [[id number]] Collected:02/26/13 1647     EGFR >60.0 Updated:02/26/13 1758    CBC with Differential [[id number]] Collected:02/26/13 1647    Specimen Information:Blood / Blood Updated:02/26/13 1746     WBC 7.03      RBC 5.12      Hgb 14.4 g/dL      Hematocrit 81.8 %      MCV 87.5 fL      MCH 28.1 pg      MCHC 32.1 g/dL      RDW 14 %      Platelets 225      MPV 11.3 fL      Neutrophils 62 %      Lymphocytes Automated 28 %      Monocytes 7 %      Eosinophils Automated 2 %      Basophils Automated 0 %      Immature Granulocyte 0 %      Nucleated RBC 0      Neutrophils Absolute 4.36      Abs Lymph Automated 1.98      Abs Mono Automated 0.52      Abs Eos Automated 0.14      Absolute Baso Automated 0.02      Absolute Immature Granulocyte 0.01     i-Stat Troponin [[id number]] Collected:02/26/13 1641     i-STAT Troponin 0.07 ng/mL Updated:02/26/13 1654          Imaging personally reviewed, including:  Radiology Results (24 Hour)     Procedure Component Value Units Date/Time    Chest AP Portable [[id number]] Collected:02/26/13 1721    Order Status:Completed  Updated:02/26/13 1726    Narrative:    HISTORY:  57 year old male with chest pain    COMPARISON: 09/28/2008    FINDINGS:  Portable upright AP view of the chest was  obtained. The  cardiac silhouette is borderline enlarged. The mediastinum and pulmonary  vasculature are within normal limits. No consolidation or pleural  effusion is identified. Hila are unremarkable.      Impression:     Borderline cardiomegaly. Negative for acute process.    Gerlene Burdock, MD   02/26/2013 5:22 PM                Signed by: Miki Kins, MD

## 2013-02-27 NOTE — Progress Notes (Addendum)
Clinical Case Manager Note    Rounding completed with: Dr. Caleb Popp, RN    Primary Diagnosis: NSTEMI, new dx DM    Expected W[place] disposition: tbd, likely home    PCP name: Chales Abrahams, A.    Mental status: WNL    Diet progression: NPO-will likely change to consistent carb per nutrition rec after cath    O2 Progression: 98% room air    Pain medication transition to PO (discontinue PCA and IV meds): no pain issues today    Abnormal lab values/tests: +troponin (peak 1.51)    NPO for cardiac cath today. New dx diabetes, likely needs to start metformin at d/c. Referral to [facility] sent for follow up. I updated SW Christina patient/plan. Likely d/c to home tomorrow.    Margaretann Loveless, RN, BSN  Clinical Case Manager  [facility]  [phone]

## 2013-02-27 NOTE — Progress Notes (Signed)
ASA      Indication(s):   ( ) Failed stress test  (x ) Chest pain / Angina  ( ) Abnormal EKG  ( ) Known CAD  ( ) Heart Failure / post Heart Transplant  (X ) Other; ACS/NonQ MI    Review of systems performed:   Yes  (x)         Allergies:     No Known Allergies      Most recent labs:    If available in Epic:     Lab Results   Component Value Date    WBC 8.54 02/27/2013    HGB 14.6 02/27/2013    HCT 44.9 02/27/2013    MCV 88.0 02/27/2013    PLT 228 02/27/2013          Lab 02/27/13 0515   NA 138   K 3.7   CL 106   CO2 22   BUN 9.0   CREAT 0.8   EGFR >60.0   GLU 146*   CA 8.7   ALB --   PHOS --       If unavailable in Epic, present in paper chart.    ASA Physical Status:   (  )  ASA 1  Healthy patient    ( x )  ASA 2  Mild systemic illness    (  )  ASA 3  Systemic disease, though not incapacitating    (  )  ASA 4  Severe systemic disease that is a constant threat to life     (  )  ASA 5  Moribund condition, patient unexpected to live >24 hours, irrespective of    procedure    (  )  E       Emergent procedure    Planned sedation:   (  ) No sedation  (x) Moderate sedation  (  ) Deep sedation (with Anesthesiology present)      Conclusion:     This patient has been seen and examined immediately prior to the procedure, and I feel that they are an appropriate candidate for the planned procedure with the planned sedation.    The risks, benefits, and alternatives to the planned procedure and sedation have been explained to the patient or the patient's guardian.     The currently available history & physical has been reviewed, and there are no major changes.     Exceptions only in the case of emergency.         Joseph Pierini, MD, [facility], FSCAI

## 2013-02-27 NOTE — Consults (Addendum)
[facility]SULT[facility]    Date Time: 02/27/2013 10:31 AM  Patient Name: William Donaldson, William Donaldson  Requesting Physician: Miki Kins, MD    Reason for Consultation:   NSTEMI    History:   William Donaldson is a 57 y.o. male with a history of HTN, HLD, and borderline DM who presented to the hospital on 02/26/2013 with an episode of chest pain. Yesterday patient had lunch at an [employer].  He then took a nap and was awakened from sleep with mid sternal chest pressure.  It was associated with SOB.  After about an hour, he called 911.  Upon arrival, the pain had subsided some.  His BP was quite elevated and he received IV labetalol.    Currently he is CP free.  He hasn't exercised in 6 months.  Denies any prior CP, SOB, PND, orthopnea, palpitations, presyncope, or syncope.  Previously he took BP and cholesterol med but stopped after feeling well.       Past Medical History:     Past Medical History   Diagnosis Date   . Hypertension      never put on meds   . Diabetes mellitus      borderline. Told he did not need meds if he lost weight.     History reviewed. No pertinent past surgical history.  Family History:   Father died at age 48 with 5 stents.  Brothers okay.  Mom died of DM and CA.   Social History:   Single.  Working.  No exercise.    History   Substance Use Topics   . Smoking status: Never Smoker    . Smokeless tobacco: Never Used   . Alcohol Use: 0.0 oz/week     0 Glasses of wine per week      Allergies:   No Known Allergies  Medications:   Home Meds:  Medication Sig   aspirin EC 81 MG EC tablet Take 81 mg by mouth daily.      Scheduled Meds:      aspirin 81 mg Oral Daily   atorvastatin 40 mg Oral Daily   enoxaparin 1 mg/kg Subcutaneous Q12H SCH   [COMPLETED] enoxaparin 80 mg Subcutaneous Once   [COMPLETED] labetalol 10 mg Intravenous Once   [COMPLETED] labetalol 10 mg Intravenous Once   lisinopril 5 mg Oral Daily   metoprolol 25 mg Oral Once   metoprolol 50 mg Oral Q12H SCH   nitroglycerin  1 patch Transdermal Q24H   pantoprazole 40 mg Oral QAM AC   sodium chloride (PF) 3 mL Intravenous Q8H     Continuous Infusions:     . sodium chloride 100 mL/hr at 02/27/13 0055     PRN Meds:acetaminophen, dextrose, dextrose, dextrose, dextrose, glucagon (rDNA), glucagon (rDNA), hydrALAZINE, insulin aspart, nitroglycerin, oxyCODONE-acetaminophen, [DISCONTINUED] insulin aspart  Review of Systems:   All other systems were reviewed and were negative except as noted in the HPI.  Physical Exam:   Vital Signs: BP 160/88  Pulse 69  Temp 97.8 F (36.6 C)  Resp 18  Ht 1.803 m (5\' 11" )  Wt 126.281 kg (278 lb 6.4 oz)  BMI 38.85 kg/m2  SpO2 96% No intake or output data in the 24 hours ending 02/27/13 1031  General Appearance:  Alert, well-appearing male in no acute distress.    HEENT: Sclera anicteric, conjunctiva without pallor, moist mucous membranes.  Neck:  Supple without jugular venous distention. Thyroid nonpalpable. Normal carotid upstroke without bruits.   Chest: Clear  to auscultation bilaterally with good air movement and respiratory effort and no wheezes, rales, or rhonchi   Cardiovascular: Normal S1 and S2 without murmurs, gallops or rub. PMI of normal size and nondisplaced.   Abdomen: Soft, nontender, nondistended, with normoactive bowel sounds. No organomegaly.  No pulsatile masses, or bruits.   Extremities: Warm without edema, clubbing, or cyanosis. All peripheral pulses are full and equal.   Skin: Normal coloration and turgor, + erthematous with white spots rash both groins, L>R, possible candida, no , xanthoma or xanthelasma.   Musculoskeletal: no joint tenderness, deformity or swelling  Neuro: Alert and oriented x3. Grossly intact. Strength is symmetrical. Normal mood and affect.   Cardiographics:   ECG: 6/13 6:35 SR at 67 bpm.  Normal axis/int. Anterolateral and nferior T wave inversion.    Compared to EKG at 0100, anterolateral T wave inversions are new.    Telemetry: SR 60-70s  Nuclear stress test:  last year at outside facility.  Reportedly  normal.     Labs Reviewed:     Recent Labs   Basename 02/27/13 0515 02/26/13 1647    WBC 8.54 7.03    HGB 14.6 14.4    HCT 44.9 44.8    PLT 228 225     Recent Labs   Basename 02/27/13 0515 02/26/13 2325 02/26/13 1641    TROPI 1.51* 0.88* --    ISTATTROPONI -- -- 0.07    CK -- -- --    CKMB -- -- --    BNP -- -- --    Recent Labs   [facility] 02/27/13 0515 02/26/13 1647    NA 138 137    K 3.7 3.8    CO2 22 24    BUN 9.0 13.0    CREAT 0.8 0.9    GLU 146* 274*    MG -- --     Recent Labs   Basename 02/26/13 2325    INR --    TSH --    CHOL 159    TRIG 246*    HDL 27*    LDL 83        Rads:     Radiology Results (24 Hour)     Procedure Component Value Units Date/Time    Chest AP Portable [[id number]] Collected:02/26/13 1721    Order Status:Completed  Updated:02/26/13 1726    Narrative:    HISTORY:  57 year old male with chest pain    COMPARISON: 09/28/2008    FINDINGS:  Portable upright AP view of the chest was obtained. The  cardiac silhouette is borderline enlarged. The mediastinum and pulmonary  vasculature are within normal limits. No consolidation or pleural  effusion is identified. Hila are unremarkable.      Impression:     Borderline cardiomegaly. Negative for acute process.    Gerlene Burdock, MD   02/26/2013 5:22 PM          Assessment:   1.  ACS / NSTEMI with troponin elevation from 0.07 to 0.88 to 1.5. And new anterolateral T wave inversions  Patient is CP free at this moment.  EKG shows anterior T wave inversion which is new compared to EKG earlier this am.    2.  HTN, uncontrolled  3.  HLD, untreated  4.  DM, newly diagnosed.      Plan:   1.  STAT troponin and CK.   2.  Cardiac catheterization today at 1pm with Dr. Jeanne Ivan.  To remain NPO.  Risks and benefits of the procedure  discussed to include allergic reaction, groin complication, stroke, MI and/or death and the patient understands and wishes to proceed.  Consent signed.    3.  Increase lopressor to 50mg  BID.    4.  Add  lisinopril 5mg  daily  5.  Continue ASA, statin  6.  Hold am dose of Lovenox.  Received a dose at [place].   7.  Check magnesium, TSH.   8. Further rec's pending coronary angiography  Signed by: Jerrell Mylar, MD, [facility], [facility]  [facility]  Spectralink [phone] or [phone] (M-F 8:30am-5pm)  Office: [phone]    NW:GNFAO, Ernest Mallick, MD

## 2013-02-27 NOTE — Consults (Signed)
NUTRITION:  Reason for Assessment: Consult - DM diet     Assessment: William Donaldson is a(n) 57 y.o. male with DM, HLD p/w NSTEMI  Labs: Glu 146 / POCT Glu 155, HDL 27, TG 246, A1C 7.2  Meds: protonix, IVF at 100 ml/hr     Anthropometrics:  Height: 180.3 cm (5\' 11" )  Weight: 126.281 kg (278 lb 6.4 oz)  Body mass index is 38.85 kg/(m^2).    Nutrition Goals: 1950-2100 kcals, 100-120 gm protein, fluids per MD  NPO  Previously with good appetite   Provided written and verbal instruction re: DM diet guidelines and DM mgmt (enc compliance with regularly checking blood sugars, taking medication as directed per MD, complying with diet guidelines and phys activity if able to incorporate)   All questions addressed during visit. RD contact information provided.     Nutrition Diagnosis:   Altered nutr related lab values r/t DM a/e/b BG >200 mg/dl (random), H0Q 7.2 - meets criteria    Intervention / Recs:  NPO. Advance diet when able - CCHO diet.  Nutrition Education - provided written and verbal instruction. Reinforce as needed  Rec c/s to Diabetes Educator/DM team for further education and eval    Goals: Goal for po at meals >70% by next RD intervention     Learning Needs: Yes, re: DM diet guidelines and education    Monitor/Eval:  Monitor po, tolerance to diet, GI, labs, med tx plan    Georgina Pillion, RD; Philis Kendall [phone]

## 2013-02-27 NOTE — Progress Notes (Addendum)
Left Heart Catheterization with PCI      Cardiac Catheterization/Percutaneous Coronary Intervention Report:      ABASS MISENER  57 y.o.  male    PreOpDiagnosis:   1. ACS, nonQ MI   2. HTN, OOC  3. HLD  4. New onset type 2 DM    PostOp Diagnosis:  1. 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 Normal LVEF  3. HTN OOC, started on IV NTG 30 mcg pre PCI for elevated BP  Procedure:  LHC, bilateral coronary angiography, left ventriculography, DES to mid LAD: 6Fr EBU 3.0 guider, pre dil with 3.0 x 12 mm Emerge Rx balloon, DES with 3.0 x 15 mm Resolute RX deployed at 14 atm at 40 secs which equals 3.25 stent size, with stenosis 95% pre reduced to <0% post, with TIMI 3 flow to large distal LAD, hemostasis 6Fr RFA with 6Fr Mynx.. Angiomax during PCI (ACT=383) Effient 60 mg po given post PCI. Total contrast 200cc Visipaque  Plan:  IV fluids post PCI  Bedrest x 4 hrs post PCI  IV NTG 30 mcg for 12 hrs post PCI  Change lopressor to carvedilol 25 mg bid for HTN  Effient 60 mg given in cath lab, then 10 mg qd for a minimal of 1 year post DES. Continue ASA 325 mg daily  Continue Lisinopril 5 mg daily and Lipitor , could decrease lipitor to 20 mg daily given his LDL level  Trend cardiac enzymes  If stable, consider d/c tomorrow    Joseph Pierini, MD, [facility], FSCAI  3:10 PM 02/27/2013

## 2013-02-27 NOTE — Plan of Care (Signed)
Patient assessed, vitals stable, patient is alert and oriented times four, patient follows commands. Patient is SR on the monitor, no c/o chest pain or sob at this time, RN trending troponin's third set at 0500, troponin's are positive, RN called attending and notified, medications adjusted and stat EKG done. Patient is on RA, lungs clear, patient is ambulatory, patient is NPO from midnight for cardiology consult. Last bowel movement yesterday, patient voiding adequate amounts of urine. RN will continue to monitor and assess patient.

## 2013-02-28 LAB — GFR: EGFR: >60

## 2013-02-28 LAB — ECG 12-LEAD
Atrial Rate: 66 {beats}/min
P Axis: -24 degrees
P-R Interval: 174 ms
Q-T Interval: 488 ms
QRS Duration: 100 ms
QTC Calculation (Bezet): 511 ms
R Axis: -7 degrees
T Axis: -62 degrees
Ventricular Rate: 66 {beats}/min

## 2013-02-28 LAB — BASIC METABOLIC PANEL
BUN: 13 mg/dL (ref 9.0–21.0)
CO2: 22 (ref 22–29)
Calcium: 8.4 mg/dL — ABNORMAL LOW (ref 8.5–10.5)
Chloride: 105 (ref 98–107)
Creatinine: 0.8 mg/dL (ref 0.7–1.3)
Glucose: 145 mg/dL — ABNORMAL HIGH (ref 70–100)
Potassium: 3.9 (ref 3.5–5.1)
Sodium: 137 (ref 136–145)

## 2013-02-28 LAB — CBC
Hematocrit: 39.3 % — ABNORMAL LOW (ref 42.0–52.0)
Hgb: 13 g/dL (ref 13.0–17.0)
MCH: 29 pg (ref 28.0–32.0)
MCHC: 33.1 g/dL (ref 32.0–36.0)
MCV: 87.7 fL (ref 80.0–100.0)
MPV: 10.5 fL (ref 9.4–12.3)
Nucleated RBC: 0 (ref 0–1)
Platelets: 205 (ref 140–400)
RBC: 4.48 — ABNORMAL LOW (ref 4.70–6.00)
RDW: 14 % (ref 12–15)
WBC: 9.38 (ref 3.50–10.80)

## 2013-02-28 LAB — POCT GLUCOSE
Whole Blood Glucose POCT: 160 mg/dL — AB (ref 70–100)
Whole Blood Glucose POCT: 216 mg/dL — AB (ref 70–100)

## 2013-02-28 LAB — HEMOGLOBIN A1C: Hemoglobin A1C: 7.2 % — ABNORMAL HIGH (ref 0.0–6.0)

## 2013-02-28 MED ORDER — METOPROLOL SUCCINATE ER 100 MG PO TB24
100.00 mg | ORAL_TABLET | Freq: Every day | ORAL | Status: DC
Start: 2013-02-28 — End: 2013-06-18

## 2013-02-28 MED ORDER — NITROGLYCERIN 0.4 MG SL SUBL
0.4000 mg | SUBLINGUAL_TABLET | SUBLINGUAL | Status: AC | PRN
Start: 2013-02-28 — End: ?

## 2013-02-28 MED ORDER — LORAZEPAM 1 MG PO TABS
1.00 mg | ORAL_TABLET | Freq: Once | ORAL | Status: DC
Start: 2013-02-28 — End: 2013-02-28

## 2013-02-28 MED ORDER — LISINOPRIL 5 MG PO TABS
5.0000 mg | ORAL_TABLET | Freq: Every day | ORAL | Status: DC
Start: 2013-02-28 — End: 2013-02-28

## 2013-02-28 MED ORDER — NYSTOP 100000 UNIT/GM EX POWD
1.00 | Freq: Two times a day (BID) | CUTANEOUS | Status: AC
Start: 2013-02-28 — End: 2013-03-10

## 2013-02-28 MED ORDER — NYSTOP 100000 UNIT/GM EX POWD
1.0000 | Freq: Two times a day (BID) | CUTANEOUS | Status: DC
Start: 2013-02-28 — End: 2013-02-28
  Administered 2013-02-28: 1 via TOPICAL
  Filled 2013-02-28: qty 15

## 2013-02-28 MED ORDER — LISINOPRIL 10 MG PO TABS
10.0000 mg | ORAL_TABLET | Freq: Every day | ORAL | Status: DC
Start: 2013-02-28 — End: 2013-02-28

## 2013-02-28 MED ORDER — ATORVASTATIN CALCIUM 80 MG PO TABS
80.0000 mg | ORAL_TABLET | Freq: Every day | ORAL | Status: DC
Start: 2013-02-28 — End: 2013-03-17

## 2013-02-28 MED ORDER — LORAZEPAM 2 MG/ML IJ SOLN
1.0000 mg | Freq: Once | INTRAMUSCULAR | Status: DC
Start: 2013-02-28 — End: 2013-02-28

## 2013-02-28 MED ORDER — PRASUGREL HCL 10 MG PO TABS
10.0000 mg | ORAL_TABLET | Freq: Every day | ORAL | Status: DC
Start: 2013-02-28 — End: 2013-03-17

## 2013-02-28 MED ORDER — LISINOPRIL 10 MG PO TABS
10.0000 mg | ORAL_TABLET | Freq: Every day | ORAL | Status: DC
Start: 2013-03-01 — End: 2013-02-28

## 2013-02-28 MED ORDER — LORAZEPAM 2 MG/ML IJ SOLN
INTRAMUSCULAR | Status: AC
Start: 2013-02-28 — End: 2013-02-28
  Filled 2013-02-28: qty 1

## 2013-02-28 MED ORDER — METFORMIN HCL 500 MG PO TABS
500.0000 mg | ORAL_TABLET | Freq: Every morning | ORAL | Status: DC
Start: 2013-02-28 — End: 2013-12-17

## 2013-02-28 MED ORDER — LORAZEPAM 0.5 MG PO TABS
0.5000 mg | ORAL_TABLET | Freq: Once | ORAL | Status: DC
Start: 2013-02-28 — End: 2013-02-28

## 2013-02-28 MED ORDER — LISINOPRIL 10 MG PO TABS
10.00 mg | ORAL_TABLET | Freq: Every day | ORAL | Status: DC
Start: 2013-02-28 — End: 2013-03-17

## 2013-02-28 NOTE — Progress Notes (Addendum)
Discharge home via ambulatory. Delayed discharge due to awaiting for arrange DM education  and sign on prescriptions. Pt received discharge instructions and pt verbalized of understanding discharge instructions. Rt groin incision dressing C/DI. Pt will be removed dressing tomorrow. Pt will be f/u with cardiac rehab and cardiology.

## 2013-02-28 NOTE — Plan of Care (Addendum)
Status: denies chest pain or SOB. Rt groin dressing C/D/I. Discharge home today. CM will set up for DM education before discharge home.

## 2013-02-28 NOTE — Discharge Instructions (Addendum)
- start the metformin on Monday morning.    Discharge Instructions for Heart Attack  You have had a heart attack. Also known as acute myocardial infarction, or AMI, a heart attack occurs when a vessel supplying the heart with blood suddenly becomes blocked. Follow these guidelines for home care and lifestyle changes.  Home Care   Take your medications exactly as directed. Don't skip doses.   Remember that recovery after a heart attack takes time. Plan to rest for at least 4-8 weeks while you recover. Then return to normal activity when your doctor says it's okay.   Ask your doctor about joining a heart rehabilitation program.   Tell your doctor if you are feeling depressed. Feelings of sadness are common after a heart attack, but it is important that you speak to someone if you are feeling overwhelmed by these feelings.   If you are having chest pain, call 911 for an ambulance.Do NOT drive yourself to the hospital.   Ask your family members to learn CPR.   Learn to take your own blood pressure and pulse. Keep a record of your results. Ask your doctor when you should seek emergency medical attention. He or she will tell you which blood pressure reading is dangerous.  Lifestyle Changes   Maintain a healthy weight. Get help to lose any extra pounds.   Cut back on salt.   Limit canned, dried, packaged, and fast foods.   Don't add salt to your food.   Season foods with herbs instead of salt when you cook.   Break the smoking habit. Enroll in a stop-smoking program to improve your chances of success.   Limit fatty foods.   Check your lipid levels regularly. (Your doctor can show you how to do this.)   Build up your activity according to your doctor's recommendation.   Ask your doctor when it's okay to resume sexual activity.   Tell your doctor about any erectile dysfunction (ED) medication you are taking. Some ED medications are not safe if you take certain heart medications.   Try to manage  stress.  Follow-Up  Make a follow-up appointment as directed by our staff.    When to Seek Medical Attention  Call 911 right awayif you have:   Chest pain that is not relieved by medication.   Shortness of breath.  Otherwise, call your doctor immediately if you have:   Lightheadedness, dizziness, or fainting.   Feeling of irregular heartbeat or fast pulse.    [address] [place], [place] [address]. All rights reserved. This information is not intended as a substitute for professional medical care. Always follow your healthcare professional's instructions.           After the Procedure   You need to remain lying down for2-12hours.   If the insertion site was in your groin, you may need to lie down with your leg still for several hours.   A nurse will check your blood pressure and the insertion site.   You may be asked to drink fluid to help flush the contrast liquid out of your system.   Have someone drive you home from the hospital.   It's normal to find a small bruise or lump at the insertion site. These common side effects should disappear within a few weeks.  When to Call Your Doctor  Call your doctor right away if you have any of the following:   Angina (chest pain).   Pain, swelling,  redness, bleeding, or drainage at the insertion site.   Severe pain, coldness, or a bluish color in the leg or arm that held the catheter.   Blood in your urine, black or tarry stools, or any other kind of bleeding.   Fever over 101.82F.    [address], [place], [place] [address]. All rights reserved. This information is not intended as a substitute for professional medical care. Always follow your healthcare professional's instructions.       Patient Education   Metoprolol Succinate Oral tablet, extended-release    Metoprolol Tartrate Oral tablet    Metoprolol Tartrate Solution for injection   Metoprolol Succinate Oral tablet,  extended-release  What is this medicine?  METOPROLOL (me TOE proe lole) is a beta-blocker. Beta-blockers reduce the workload on the heart and help it to beat more regularly. This medicine is used to treat high blood pressure and to prevent chest pain. It is also used to after a heart attack and to prevent an additional heart attack from occurring.  This medicine may be used for other purposes; ask your health care provider or pharmacist if you have questions.  What should I tell my health care provider before I take this medicine?  They need to know if you have any of these conditions:   diabetes   heart or vessel disease like slow heart rate, worsening heart failure, heart block, sick sinus syndrome or Raynaud's disease   kidney disease   liver disease   lung or breathing disease, like asthma or emphysema   pheochromocytoma   thyroid disease   an unusual or allergic reaction to metoprolol, other beta-blockers, medicines, foods, dyes, or preservatives   pregnant or trying to get pregnant   breast-feeding  How should I use this medicine?  Take this medicine by mouth with a glass of water. Follow the directions on the prescription label. Do not crush or chew. Take this medicine with or immediately after meals. Take your doses at regular intervals. Do not take more medicine than directed. Do not stop taking this medicine suddenly. This could lead to serious heart-related effects.  Talk to your pediatrician regarding the use of this medicine in children. While this drug may be prescribed for children as young as 6 years for selected conditions, precautions do apply.  Overdosage: If you think you have taken too much of this medicine contact a poison control center or emergency room at once.  NOTE: This medicine is only for you. Do not share this medicine with others.  What if I miss a dose?  If you miss a dose, take it as soon as you can. If it is almost time for your next dose, take only that dose. Do not take  double or extra doses.  What may interact with this medicine?  Do not take this medicine with any of the following medications:   sotalol  This medicine may also interact with the following medications:   clonidine   digoxin   dobutamine   epinephrine   isoproterenol   medicine to control heart rhythm like quinidine, propafenone   medicine for depression like monoamine oxidase (MAO) inhibitors, fluoxetine, and paroxetine   medicine for blood pressure like calcium channel blockers   reserpine  This list may not describe all possible interactions. Give your health care provider a list of all the medicines, herbs, non-prescription drugs, or dietary supplements you use. Also tell them if you smoke, drink alcohol, or use illegal drugs.  Some items may interact with your medicine.  What should I watch for while using this medicine?  Visit your doctor or health care professional for regular check ups. Contact your doctor right away if your symptoms worsen. Check your blood pressure and pulse rate regularly. Ask your health care professional what your blood pressure and pulse rate should be, and when you should contact them.  You may get drowsy or dizzy. Do not drive, use machinery, or do anything that needs mental alertness until you know how this medicine affects you. Do not sit or stand up quickly, especially if you are an older patient. This reduces the risk of dizzy or fainting spells. Contact your doctor if these symptoms continue. Alcohol may interfere with the effect of this medicine. Avoid alcoholic drinks.  What side effects may I notice from receiving this medicine?  Side effects that you should report to your doctor or health care professional as soon as possible:   allergic reactions like skin rash, itching or hives   cold or numb hands or feet   depression   difficulty breathing   faint   fever with sore throat   irregular heartbeat, chest pain   rapid weight gain   swollen legs or  ankles  Side effects that usually do not require medical attention (report to your doctor or health care professional if they continue or are bothersome):   anxiety or nervousness   change in sex drive or performance   dry skin   headache   nightmares or trouble sleeping   short term memory loss   stomach upset or diarrhea   unusually tired  This list may not describe all possible side effects. Call your doctor for medical advice about side effects. You may report side effects to FDA at 1-800-FDA-1088.  Where should I keep my medicine?  Keep out of the reach of children.  Store at room temperature between 15 and 30 degrees C (59 and 86 degrees F). Throw away any unused medicine after the expiration date.  NOTE:This sheet is a summary. It may not cover all possible information. If you have questions about this medicine, talk to your doctor, pharmacist, or health care provider. Copyright 2014 Gold Standard      Lisinopril Oral tablet  What is this medicine?  LISINOPRIL (lyse IN oh pril) is an ACE inhibitor. This medicine is used to treat high blood pressure and heart failure. It is also used to protect the heart immediately after a heart attack.  This medicine may be used for other purposes; ask your health care provider or pharmacist if you have questions.  What should I tell my health care provider before I take this medicine?  They need to know if you have any of these conditions:   diabetes   heart or blood vessel disease   immune system disease like lupus or scleroderma   kidney disease   low blood pressure   previous swelling of the tongue, face, or lips with difficulty breathing, difficulty swallowing, hoarseness, or tightening of the throat   an unusual or allergic reaction to lisinopril, other ACE inhibitors, insect venom, foods, dyes, or preservatives   pregnant or trying to get pregnant   breast-feeding  How should I use this medicine?  Take this medicine by mouth with a glass of water.  Follow the directions on your prescription label. You may take this medicine with or without food. Take your medicine at regular intervals. Do not stop taking  this medicine except on the advice of your doctor or health care professional.  Talk to your pediatrician regarding the use of this medicine in children. Special care may be needed. While this drug may be prescribed for children as young as 22 years of age for selected conditions, precautions do apply.  Overdosage: If you think you have taken too much of this medicine contact a poison control center or emergency room at once.  NOTE: This medicine is only for you. Do not share this medicine with others.  What if I miss a dose?  If you miss a dose, take it as soon as you can. If it is almost time for your next dose, take only that dose. Do not take double or extra doses.  What may interact with this medicine?   diuretics   lithium   NSAIDs, medicines for pain and inflammation, like ibuprofen or naproxen   over-the-counter herbal supplements like hawthorn   potassium salts or potassium supplements   salt substitutes  This list may not describe all possible interactions. Give your health care provider a list of all the medicines, herbs, non-prescription drugs, or dietary supplements you use. Also tell them if you smoke, drink alcohol, or use illegal drugs. Some items may interact with your medicine.  What should I watch for while using this medicine?  Visit your doctor or health care professional for regular check ups. Check your blood pressure as directed. Ask your doctor what your blood pressure should be, and when you should contact him or her. Call your doctor or health care professional if you notice an irregular or fast heart beat.  Women should inform their doctor if they wish to become pregnant or think they might be pregnant. There is a potential for serious side effects to an unborn child. Talk to your health care professional or pharmacist for more  information.  Check with your doctor or health care professional if you get an attack of severe diarrhea, nausea and vomiting, or if you sweat a lot. The loss of too much body fluid can make it dangerous for you to take this medicine.  You may get drowsy or dizzy. Do not drive, use machinery, or do anything that needs mental alertness until you know how this drug affects you. Do not stand or sit up quickly, especially if you are an older patient. This reduces the risk of dizzy or fainting spells. Alcohol can make you more drowsy and dizzy. Avoid alcoholic drinks.  Avoid salt substitutes unless you are told otherwise by your doctor or health care professional.  Do not treat yourself for coughs, colds, or pain while you are taking this medicine without asking your doctor or health care professional for advice. Some ingredients may increase your blood pressure.  What side effects may I notice from receiving this medicine?  Side effects that you should report to your doctor or health care professional as soon as possible:   abdominal pain with or without nausea or vomiting   allergic reactions like skin rash or hives, swelling of the hands, feet, face, lips, throat, or tongue   dark urine   difficulty breathing   dizzy, lightheaded or fainting spell   fever or sore throat   irregular heart beat, chest pain   pain or difficulty passing urine   redness, blistering, peeling or loosening of the skin, including inside the mouth   unusually weak   yellowing of the eyes or skin  Side effects that usually  do not require medical attention (report to your doctor or health care professional if they continue or are bothersome):   change in taste   cough   decreased sexual function or desire   headache   sun sensitivity   tiredness  This list may not describe all possible side effects. Call your doctor for medical advice about side effects. You may report side effects to FDA at 1-800-FDA-1088.  Where should I keep  my medicine?  Keep out of the reach of children.  Store at room temperature between 15 and 30 degrees C (59 and 86 degrees F). Protect from moisture. Keep container tightly closed. Throw away any unused medicine after the expiration date.  NOTE:This sheet is a summary. It may not cover all possible information. If you have questions about this medicine, talk to your doctor, pharmacist, or health care provider. Copyright 2014 Gold Standard      Prasugrel Oral tablet  What is this medicine?  PRASUGREL (PRA soo grel) helps to prevent blood clots. This medicine is used to prevent heart attack, stroke, or other vascular events in people who are at high risk.  This medicine may be used for other purposes; ask your health care provider or pharmacist if you have questions.  What should I tell my health care provider before I take this medicine?  They need to know if you have any of these conditions:   bleeding disorders   kidney disease   liver disease   recent trauma or surgery   stomach or intestinal ulcers   stroke or transient ischemic attack   an unusual or allergic reaction to prasugrel, other medicines, foods, dyes, or preservatives   pregnant or trying to get pregnant   breast-feeding  How should I use this medicine?  Take this medicine by mouth with a drink of water. Follow the directions on the prescription label. You may take this medicine with or without food. If it upsets your stomach, take it with food. This medicine may be chewed or it may be crushed and put into food or liquids such as applesauce, juice, or water as long as it is taken immediately. This medicine has a bitter taste that you may notice if it is chewed or crushed. Take your medicine at regular intervals. Do not take your medicine more often than directed. Do not stop taking except on your doctor's advice.  Talk to your pediatrician regarding the use of this medicine in children. Special care may be needed.  Overdosage: If you think  you have taken too much of this medicine contact a poison control center or emergency room at once.  NOTE: This medicine is only for you. Do not share this medicine with others.  What if I miss a dose?  If you miss a dose, take it as soon as you can. If it is almost time for your next dose, take only that dose. Do not take double or extra doses.  What may interact with this medicine?   aspirin   certain medicines that treat or prevent blood clots like warfarin, enoxaparin, and dalteparin   NSAIDS, medicines for pain and inflammation, like ibuprofen or naproxen  This list may not describe all possible interactions. Give your health care provider a list of all the medicines, herbs, non-prescription drugs, or dietary supplements you use. Also tell them if you smoke, drink alcohol, or use illegal drugs. Some items may interact with your medicine.  What should I watch for while using  this medicine?  Visit your doctor or health care professional for regular check ups. Do not stop taking your medicine unless your doctor tells you to.  If you are going to have surgery or dental work, tell your doctor or health care professional that you are taking this medicine.  What side effects may I notice from receiving this medicine?  Side effects that you should report to your doctor or health care professional as soon as possible:   allergic reactions like skin rash, itching or hives, swelling of the face, lips, or tongue   black, tarry stools   blood in urine or vomit   breathing problems   changes in vision   confusion, trouble speaking or understanding   fever or chills   sudden weakness   unusual bleeding or bruising  Side effects that usually do not require medical attention (report to your doctor or health care professional if they continue or are bothersome):   diarrhea   headache   nausea, vomiting   pain in back, arms or legs  This list may not describe all possible side effects. Call your doctor for medical  advice about side effects. You may report side effects to FDA at 1-800-FDA-1088.  Where should I keep my medicine?  Keep out of the reach of children.  Store at room temperature between 15 and 30 degrees C (59 and 86 degrees F). Throw away any unused medicine after the expiration date.  NOTE:This sheet is a summary. It may not cover all possible information. If you have questions about this medicine, talk to your doctor, pharmacist, or health care provider. Copyright 2014 Gold Standard        Patient Education   Metformin Hydrochloride Modified-release tablet    Metformin Hydrochloride Oral solution    Metformin Hydrochloride Oral tablet    Metformin Hydrochloride Oral tablet, extended-release   Metformin Hydrochloride Oral tablet  What is this medicine?  METFORMIN (met FOR min) is used to treat type 2 diabetes. It helps to control blood sugar. Treatment is combined with diet and exercise. This medicine can be used alone or with other medicines for diabetes.  This medicine may be used for other purposes; ask your health care provider or pharmacist if you have questions.  What should I tell my health care provider before I take this medicine?  They need to know if you have any of these conditions:   anemia   frequently drink alcohol-containing beverages   become easily dehydrated   heart attack   heart failure that is treated with medications   kidney disease   liver disease   polycystic ovary syndrome   serious infection or injury   vomiting   an unusual or allergic reaction to metformin, other medicines, foods, dyes, or preservatives   pregnant or trying to get pregnant   breast-feeding  How should I use this medicine?  Take this medicine by mouth. Take it with meals. Swallow the tablets with a drink of water. Follow the directions on the prescription label. Take your medicine at regular intervals. Do not take your medicine more often than directed.  Talk to your pediatrician regarding the use of  this medicine in children. While this drug may be prescribed for children as young as 25 years of age for selected conditions, precautions do apply.  Overdosage: If you think you have taken too much of this medicine contact a poison control center or emergency room at once.  NOTE: This medicine is only  for you. Do not share this medicine with others.  What if I miss a dose?  If you miss a dose, take it as soon as you can. If it is almost time for your next dose, take only that dose. Do not take double or extra doses.  What may interact with this medicine?  Do not take this medicine with any of the following medications:   dofetilide   gatifloxacin   certain contrast medicines given before X-rays, CT scans, MRI, or other procedures  This medicine may also interact with the following medications:   digoxin   diuretics   male hormones, like estrogens or progestins and birth control pills   isoniazid   medicines for blood pressure, heart disease, irregular heart beat   morphine   nicotinic acid   phenothiazines like chlorpromazine, mesoridazine, prochlorperazine, thioridazine   phenytoin   procainamide   quinidine   quinine   ranitidine   steroid medicines like prednisone or cortisone   stimulant medicines for attention disorders, weight loss, or to stay awake   thyroid medicines   trimethoprim   vancomycin  This list may not describe all possible interactions. Give your health care provider a list of all the medicines, herbs, non-prescription drugs, or dietary supplements you use. Also tell them if you smoke, drink alcohol, or use illegal drugs. Some items may interact with your medicine.  What should I watch for while using this medicine?  Visit your doctor or health care professional for regular checks on your progress.  Learn how to check your blood sugar. Learn the symptoms of low and high blood sugar and how to manage them.  If you have low blood sugar, eat or drink something that has sugar.  Make sure others know to get medical help quickly if you have serious symptoms of low blood sugar, like if you become unconscious or have a seizure.  If you need surgery or if you will need a procedure with contrast drugs, tell your doctor or health care professional that you are taking this medicine.  Wear a medical identification bracelet or chain to say you have diabetes, and carry a card that lists all your medications.  What side effects may I notice from receiving this medicine?  Side effects that you should report to your doctor or health care professional as soon as possible:   allergic reactions like skin rash, itching or hives, swelling of the face, lips, or tongue   breathing problems   feeling faint or lightheaded, falls   low blood sugar (ask your doctor or health care professional for a list of these symptoms)   muscle aches or pains   slow or irregular heartbeat   unusual stomach pain or discomfort   unusually tired or weak  Side effects that usually do not require medical attention (report to your doctor or health care professional if they continue or are bothersome):   diarrhea   headache   heartburn   metallic taste in mouth   nausea   stomach gas, upset  This list may not describe all possible side effects. Call your doctor for medical advice about side effects. You may report side effects to FDA at 1-800-FDA-1088.  Where should I keep my medicine?  Keep out of the reach of children.  Store at room temperature between 15 and 30 degrees C (59 and 86 degrees F). Protect from moisture and light. Throw away any unused medicine after the expiration date.  NOTE:This sheet is  a summary. It may not cover all possible information. If you have questions about this medicine, talk to your doctor, pharmacist, or health care provider. Copyright 2014 Gold Standard    Prasugrel Oral tablet  What is this medicine?  PRASUGREL (PRA soo grel) helps to prevent blood clots. This medicine is used to  prevent heart attack, stroke, or other vascular events in people who are at high risk.  This medicine may be used for other purposes; ask your health care provider or pharmacist if you have questions.  What should I tell my health care provider before I take this medicine?  They need to know if you have any of these conditions:   bleeding disorders   kidney disease   liver disease   recent trauma or surgery   stomach or intestinal ulcers   stroke or transient ischemic attack   an unusual or allergic reaction to prasugrel, other medicines, foods, dyes, or preservatives   pregnant or trying to get pregnant   breast-feeding  How should I use this medicine?  Take this medicine by mouth with a drink of water. Follow the directions on the prescription label. You may take this medicine with or without food. If it upsets your stomach, take it with food. This medicine may be chewed or it may be crushed and put into food or liquids such as applesauce, juice, or water as long as it is taken immediately. This medicine has a bitter taste that you may notice if it is chewed or crushed. Take your medicine at regular intervals. Do not take your medicine more often than directed. Do not stop taking except on your doctor's advice.  Talk to your pediatrician regarding the use of this medicine in children. Special care may be needed.  Overdosage: If you think you have taken too much of this medicine contact a poison control center or emergency room at once.  NOTE: This medicine is only for you. Do not share this medicine with others.  What if I miss a dose?  If you miss a dose, take it as soon as you can. If it is almost time for your next dose, take only that dose. Do not take double or extra doses.  What may interact with this medicine?   aspirin   certain medicines that treat or prevent blood clots like warfarin, enoxaparin, and dalteparin   NSAIDS, medicines for pain and inflammation, like ibuprofen or naproxen  This  list may not describe all possible interactions. Give your health care provider a list of all the medicines, herbs, non-prescription drugs, or dietary supplements you use. Also tell them if you smoke, drink alcohol, or use illegal drugs. Some items may interact with your medicine.  What should I watch for while using this medicine?  Visit your doctor or health care professional for regular check ups. Do not stop taking your medicine unless your doctor tells you to.  If you are going to have surgery or dental work, tell your doctor or health care professional that you are taking this medicine.  What side effects may I notice from receiving this medicine?  Side effects that you should report to your doctor or health care professional as soon as possible:   allergic reactions like skin rash, itching or hives, swelling of the face, lips, or tongue   black, tarry stools   blood in urine or vomit   breathing problems   changes in vision   confusion, trouble speaking or understanding  fever or chills   sudden weakness   unusual bleeding or bruising  Side effects that usually do not require medical attention (report to your doctor or health care professional if they continue or are bothersome):   diarrhea   headache   nausea, vomiting   pain in back, arms or legs  This list may not describe all possible side effects. Call your doctor for medical advice about side effects. You may report side effects to FDA at 1-800-FDA-1088.  Where should I keep my medicine?  Keep out of the reach of children.  Store at room temperature between 15 and 30 degrees C (59 and 86 degrees F). Throw away any unused medicine after the expiration date.  NOTE:This sheet is a summary. It may not cover all possible information. If you have questions about this medicine, talk to your doctor, pharmacist, or health care provider. Copyright 2014 Gold Standard        Patient Education   Nystatin Oral suspension    Nystatin Oral tablet     Nystatin Oral troche    Nystatin Topical cream    Nystatin Topical ointment    Nystatin Topical powder    Nystatin Vaginal tablet   Nystatin Topical cream  What is this medicine?  NYSTATIN (nye STAT in) is an antifungal medicine. It is used to treat certain kinds of fungal or yeast infections of the skin.  This medicine may be used for other purposes; ask your health care provider or pharmacist if you have questions.  What should I tell my health care provider before I take this medicine?  They need to know if you have any of these conditions:   an unusual or allergic reaction to nystatin, other foods, dyes or preservatives   pregnant or trying to get pregnant   breast-feeding  How should I use this medicine?  This medicine is for external use on the skin only. Follow the directions on the prescription label. Wash hands before and after use. If treating a hand or nail infection, wash hands before use only. Apply a thin layer of this medicine to cover the affected skin and surrounding area. You can cover the area with a sterile gauze dressing (bandage). Do not use an airtight bandage (such as a plastic-covered bandage). Do not get the medicine in your eyes. If you do, rinse out with plenty of cool tap water. Use the full course of treatment prescribed, even if you think the infection is getting better. Use at regular intervals. Do not use your medicine more often than directed. Do not use this medicine for any condition other than the one for which it was prescribed.  Talk to your pediatrician regarding the use of this medicine in children. Special care may be needed.  Overdosage: If you think you have taken too much of this medicine contact a poison control center or emergency room at once.  NOTE: This medicine is only for you. Do not share this medicine with others.  What if I miss a dose?  If you miss a dose, use it as soon as you can. If it is almost time for your next dose, use only that dose. Do not  use double or extra doses.  What may interact with this medicine?  Interactions are not expected. Do not use any other skin products on the affected area without telling your doctor or health care professional.  This list may not describe all possible interactions. Give your health care provider a  list of all the medicines, herbs, non-prescription drugs, or dietary supplements you use. Also tell them if you smoke, drink alcohol, or use illegal drugs. Some items may interact with your medicine.  What should I watch for while using this medicine?  Tell your doctor or health care professional if your symptoms do not improve after 3 days.  After bathing make sure that your skin is very dry. Fungal infections like moist conditions. Do not walk around barefoot.  To help prevent reinfection, wear freshly washed cotton, not synthetic, clothing.  What side effects may I notice from receiving this medicine?  Side effects that usually do not require medical attention (report to your doctor or health care professional if they continue or are bothersome):   skin irritation  This list may not describe all possible side effects. Call your doctor for medical advice about side effects. You may report side effects to FDA at 1-800-FDA-1088.  Where should I keep my medicine?  Keep out of the reach of children.  Store at room temperature 15 to 30 degrees C (59 to 86 degrees F). Throw away any unused medicine after the expiration date.  NOTE:This sheet is a summary. It may not cover all possible information. If you have questions about this medicine, talk to your doctor, pharmacist, or health care provider. Copyright 2014 Gold Standard        Patient Education   Nitroglycerin Oral capsule, extended-release    Nitroglycerin Oral tablet, extended-release    Nitroglycerin Solution for injection    Nitroglycerin Sublingual tablet    Nitroglycerin Sublingual/Translingual spray    Nitroglycerin Topical ointment    Nitroglycerin  Transdermal patch - 24 hour    Nitroglycerin, Dextrose Solution for injection   Nitroglycerin Sublingual tablet  What is this medicine?  NITROGLYCERIN (nye troe GLI ser in) is a type of vasodilator. It relaxes blood vessels, increasing the blood and oxygen supply to your heart. This medicine is used to relieve chest pain caused by angina. It is also used to prevent chest pain before activities like climbing stairs, going outdoors in cold weather, or sexual activity.  This medicine may be used for other purposes; ask your health care provider or pharmacist if you have questions.  What should I tell my health care provider before I take this medicine?  They need to know if you have any of these conditions:   anemia   head injury, recent stroke, or bleeding in the brain   liver disease   previous heart attack   an unusual or allergic reaction to nitroglycerin, other medicines, foods, dyes, or preservatives   pregnant or trying to get pregnant   breast-feeding  How should I use this medicine?  Take this medicine by mouth as needed. At the first sign of an angina attack (chest pain or tightness) place one tablet under your tongue. You can also take this medicine 5 to 10 minutes before an event likely to produce chest pain. Follow the directions on the prescription label. Let the tablet dissolve under the tongue. Do not swallow whole. Replace the dose if you accidentally swallow it. It will help if your mouth is not dry. Saliva around the tablet will help it to dissolve more quickly. Do not eat or drink, smoke or chew tobacco while a tablet is dissolving. If you are not better within 5 minutes after taking ONE dose of nitroglycerin, call 9-1-1 immediately to seek emergency medical care. Do not take more than 3 nitroglycerin tablets over  15 minutes.  If you take this medicine often to relieve symptoms of angina, your doctor or health care professional may provide you with different instructions to manage your  symptoms. If symptoms do not go away after following these instructions, it is important to call 9-1-1 immediately. Do not take more than 3 nitroglycerin tablets over 15 minutes.  Talk to your pediatrician regarding the use of this medicine in children. Special care may be needed.  Overdosage: If you think you have taken too much of this medicine contact a poison control center or emergency room at once.  NOTE: This medicine is only for you. Do not share this medicine with others.  What if I miss a dose?  This does not apply. This medicine is only used as needed.  What may interact with this medicine?  Do not take this medicine with any of the following medications:   certain migraine medicines like ergotamine and dihydroergotamine (DHE)   medicines used to treat erectile dysfunction like sildenafil, tadalafil, and vardenafil  This medicine may also interact with the following medications:   alteplase   aspirin   heparin   medicines for high blood pressure   medicines for mental depression   other medicines used to treat angina   phenothiazines like chlorpromazine, mesoridazine, prochlorperazine, thioridazine  This list may not describe all possible interactions. Give your health care provider a list of all the medicines, herbs, non-prescription drugs, or dietary supplements you use. Also tell them if you smoke, drink alcohol, or use illegal drugs. Some items may interact with your medicine.  What should I watch for while using this medicine?  Tell your doctor or health care professional if you feel your medicine is no longer working.  Keep this medicine with you at all times. Sit or lie down when you take your medicine to prevent falling if you feel dizzy or faint after using it. Try to remain calm. This will help you to feel better faster. If you feel dizzy, take several deep breaths and lie down with your feet propped up, or bend forward with your head resting between your knees.  You may get drowsy or  dizzy. Do not drive, use machinery, or do anything that needs mental alertness until you know how this drug affects you. Do not stand or sit up quickly, especially if you are an older patient. This reduces the risk of dizzy or fainting spells. Alcohol can make you more drowsy and dizzy. Avoid alcoholic drinks.  Do not treat yourself for coughs, colds, or pain while you are taking this medicine without asking your doctor or health care professional for advice. Some ingredients may increase your blood pressure.  What side effects may I notice from receiving this medicine?  Side effects that you should report to your doctor or health care professional as soon as possible:   blurred vision   dry mouth   skin rash   sweating   the feeling of extreme pressure in the head   unusually weak or tired  Side effects that usually do not require medical attention (report to your doctor or health care professional if they continue or are bothersome):   flushing of the face or neck   headache   irregular heartbeat, palpitations   nausea, vomiting  This list may not describe all possible side effects. Call your doctor for medical advice about side effects. You may report side effects to FDA at 1-800-FDA-1088.  Where should I keep my medicine?  Keep out of the reach of children.  Store at room temperature between 20 and 25 degrees C (68 and 77 degrees F). Store in [employer]. Protect from light and moisture. Keep tightly closed. Throw away any unused medicine after the expiration date.  NOTE:This sheet is a summary. It may not cover all possible information. If you have questions about this medicine, talk to your doctor, pharmacist, or health care provider. Copyright 2014 Gold Standard      Atorvastatin Calcium Oral tablet  What is this medicine?  ATORVASTATIN (a TORE [place] sta tin) is known as a HMG-CoA reductase inhibitor or 'statin'. It lowers the level of cholesterol and triglycerides in the blood. This drug may  also reduce the risk of heart attack, stroke, or other health problems in patients with risk factors for heart disease. Diet and lifestyle changes are often used with this drug.  This medicine may be used for other purposes; ask your health care provider or pharmacist if you have questions.  What should I tell my health care provider before I take this medicine?  They need to know if you have any of these conditions:   frequently drink alcoholic beverages   history of stroke, TIA   kidney disease   liver disease   muscle aches or weakness   other medical condition   an unusual or allergic reaction to atorvastatin, other medicines, foods, dyes, or preservatives   pregnant or trying to get pregnant   breast-feeding  How should I use this medicine?  Take this medicine by mouth with a glass of water. Follow the directions on the prescription label. You can take this medicine with or without food. Take your doses at regular intervals. Do not take your medicine more often than directed.  Talk to your pediatrician regarding the use of this medicine in children. While this drug may be prescribed for children as young as 65 years old for selected conditions, precautions do apply.  Overdosage: If you think you have taken too much of this medicine contact a poison control center or emergency room at once.  NOTE: This medicine is only for you. Do not share this medicine with others.  What if I miss a dose?  If you miss a dose, take it as soon as you can. If it is almost time for your next dose, take only that dose. Do not take double or extra doses.  What may interact with this medicine?  Do not take this medicine with any of the following medications:   red yeast rice   telaprevir   telithromycin   voriconazole  This medicine may also interact with the following medications:   alcohol   antiviral medicines for HIV or AIDS   boceprevir   certain antibiotics like clarithromycin, erythromycin,  troleandomycin   certain medicines for cholesterol like fenofibrate or gemfibrozil   cimetidine   clarithromycin   colchicine   cyclosporine   digoxin   male hormones, like estrogens or progestins and birth control pills   grapefruit juice   medicines for fungal infections like fluconazole, itraconazole, ketoconazole   niacin   rifampin   spironolactone  This list may not describe all possible interactions. Give your health care provider a list of all the medicines, herbs, non-prescription drugs, or dietary supplements you use. Also tell them if you smoke, drink alcohol, or use illegal drugs. Some items may interact with your medicine.  What should I watch for while using this medicine?  Visit  your doctor or health care professional for regular check-ups. You may need regular tests to make sure your liver is working properly.  Tell your doctor or health care professional right away if you get any unexplained muscle pain, tenderness, or weakness, especially if you also have a fever and tiredness. Your doctor or health care professional may tell you to stop taking this medicine if you develop muscle problems. If your muscle problems do not go away after stopping this medicine, contact your health care professional.  This drug is only part of a total heart-health program. Your doctor or a dietician can suggest a low-cholesterol and low-fat diet to help. Avoid alcohol and smoking, and keep a proper exercise schedule.  Do not use this drug if you are pregnant or breast-feeding. Serious side effects to an unborn child or to an infant are possible. Talk to your doctor or pharmacist for more information.  This medicine may affect blood sugar levels. If you have diabetes, check with your doctor or health care professional before you change your diet or the dose of your diabetic medicine.  If you are going to have surgery tell your health care professional that you are taking this drug.  What side effects may I  notice from receiving this medicine?  Side effects that you should report to your doctor or health care professional as soon as possible:   allergic reactions like skin rash, itching or hives, swelling of the face, lips, or tongue   dark urine   fever   joint pain   muscle cramps, pain   redness, blistering, peeling or loosening of the skin, including inside the mouth   trouble passing urine or change in the amount of urine   unusually weak or tired   yellowing of eyes or skin  Side effects that usually do not require medical attention (report to your doctor or health care professional if they continue or are bothersome):   constipation   heartburn   stomach gas, pain, upset  This list may not describe all possible side effects. Call your doctor for medical advice about side effects. You may report side effects to FDA at 1-800-FDA-1088.  Where should I keep my medicine?  Keep out of the reach of children.  Store at room temperature between 20 to 25 degrees C (68 to 77 degrees F). Throw away any unused medicine after the expiration date.  NOTE:This sheet is a summary. It may not cover all possible information. If you have questions about this medicine, talk to your doctor, pharmacist, or health care provider. Copyright 2014 Gold Standard      Lisinopril Oral tablet  What is this medicine?  LISINOPRIL (lyse IN oh pril) is an ACE inhibitor. This medicine is used to treat high blood pressure and heart failure. It is also used to protect the heart immediately after a heart attack.  This medicine may be used for other purposes; ask your health care provider or pharmacist if you have questions.  What should I tell my health care provider before I take this medicine?  They need to know if you have any of these conditions:   diabetes   heart or blood vessel disease   immune system disease like lupus or scleroderma   kidney disease   low blood pressure   previous swelling of the tongue, face, or lips with  difficulty breathing, difficulty swallowing, hoarseness, or tightening of the throat   an unusual or allergic reaction to lisinopril, other  ACE inhibitors, insect venom, foods, dyes, or preservatives   pregnant or trying to get pregnant   breast-feeding  How should I use this medicine?  Take this medicine by mouth with a glass of water. Follow the directions on your prescription label. You may take this medicine with or without food. Take your medicine at regular intervals. Do not stop taking this medicine except on the advice of your doctor or health care professional.  Talk to your pediatrician regarding the use of this medicine in children. Special care may be needed. While this drug may be prescribed for children as young as 20 years of age for selected conditions, precautions do apply.  Overdosage: If you think you have taken too much of this medicine contact a poison control center or emergency room at once.  NOTE: This medicine is only for you. Do not share this medicine with others.  What if I miss a dose?  If you miss a dose, take it as soon as you can. If it is almost time for your next dose, take only that dose. Do not take double or extra doses.  What may interact with this medicine?   diuretics   lithium   NSAIDs, medicines for pain and inflammation, like ibuprofen or naproxen   over-the-counter herbal supplements like hawthorn   potassium salts or potassium supplements   salt substitutes  This list may not describe all possible interactions. Give your health care provider a list of all the medicines, herbs, non-prescription drugs, or dietary supplements you use. Also tell them if you smoke, drink alcohol, or use illegal drugs. Some items may interact with your medicine.  What should I watch for while using this medicine?  Visit your doctor or health care professional for regular check ups. Check your blood pressure as directed. Ask your doctor what your blood pressure should be, and when you  should contact him or her. Call your doctor or health care professional if you notice an irregular or fast heart beat.  Women should inform their doctor if they wish to become pregnant or think they might be pregnant. There is a potential for serious side effects to an unborn child. Talk to your health care professional or pharmacist for more information.  Check with your doctor or health care professional if you get an attack of severe diarrhea, nausea and vomiting, or if you sweat a lot. The loss of too much body fluid can make it dangerous for you to take this medicine.  You may get drowsy or dizzy. Do not drive, use machinery, or do anything that needs mental alertness until you know how this drug affects you. Do not stand or sit up quickly, especially if you are an older patient. This reduces the risk of dizzy or fainting spells. Alcohol can make you more drowsy and dizzy. Avoid alcoholic drinks.  Avoid salt substitutes unless you are told otherwise by your doctor or health care professional.  Do not treat yourself for coughs, colds, or pain while you are taking this medicine without asking your doctor or health care professional for advice. Some ingredients may increase your blood pressure.  What side effects may I notice from receiving this medicine?  Side effects that you should report to your doctor or health care professional as soon as possible:   abdominal pain with or without nausea or vomiting   allergic reactions like skin rash or hives, swelling of the hands, feet, face, lips, throat, or tongue   dark  urine   difficulty breathing   dizzy, lightheaded or fainting spell   fever or sore throat   irregular heart beat, chest pain   pain or difficulty passing urine   redness, blistering, peeling or loosening of the skin, including inside the mouth   unusually weak   yellowing of the eyes or skin  Side effects that usually do not require medical attention (report to your doctor or health care  professional if they continue or are bothersome):   change in taste   cough   decreased sexual function or desire   headache   sun sensitivity   tiredness  This list may not describe all possible side effects. Call your doctor for medical advice about side effects. You may report side effects to FDA at 1-800-FDA-1088.  Where should I keep my medicine?  Keep out of the reach of children.  Store at room temperature between 15 and 30 degrees C (59 and 86 degrees F). Protect from moisture. Keep container tightly closed. Throw away any unused medicine after the expiration date.  NOTE:This sheet is a summary. It may not cover all possible information. If you have questions about this medicine, talk to your doctor, pharmacist, or health care provider. Copyright 2014 Gold Standard    [facility]    An appointment has been scheduled for you at the  [facility] for:      Please call [facility] for follow up for diabetes education [phone]    At Location    [address]  [address]  [place], [place] [address]  [phone]    Please bring the following with you to your appointment:    Medications in their original bottles    Glucometer/blood sugar log (if diabetic)    Weight log (if heart failure)    Proof of income (to enroll in medication assistance programs-last tax return or last 2 months of pay stubs)    If this is your initial appointment, please come 15 minutes prior to scheduled appointment time to complete initial appointment forms.

## 2013-02-28 NOTE — Progress Notes (Signed)
At 0600 patient very agitated.pulled i.v , the heart monitor & walking around the hallway yelling.security called.dr Chales Abrahams notified.dr hunter returned call.ativan 1mg   IM given at 0635 am..safety maintained per unit protocol.

## 2013-02-28 NOTE — Progress Notes (Signed)
SW left message for [place] [facility] with referral to TCM for Diabetes Education. Pt will d/c home. Achilles Dunk, LCSW SW/CM

## 2013-02-28 NOTE — Progress Notes (Signed)
Patient alert & oriented x3. Vss.denies pain.no sob noted.s/p cardiac cath .stent to LAD. rt groin dsg cdi. Positive pulses.no hemartoma noted.patient on nitro gtt @ 30 mcq/min 26ml/hr & ns 125cc/hr.nitro d/c at 0440.at  0330 am patient has episode of confusion. patient took [place] 5mg  po at 0045.patient pulled i.v  at 0335.d/c  Nitro & i.v fluids.will cont monitor patient.vss.patient's safety maintained per unit protocol.safety mat & bed alarm activated.

## 2013-02-28 NOTE — Progress Notes (Signed)
[facility] PROSPERITY PROGRESS NOTE     Date Time:   02/28/2013    1:39 PM  Patient Name: William Donaldson    LOS: 2 days     Subjective:  Pt denies groin pain, CP, SOB. Ambulating without difficulty.    Scheduled Meds:       aspirin EC 325 mg Oral Daily   atorvastatin 80 mg Oral Daily   [COMPLETED] bivalirudin      famotidine 20 mg Oral Q12H SCH   [COMPLETED] fentaNYL      [COMPLETED] heparin      [COMPLETED] lidocaine      lisinopril 5 mg Oral Daily   [COMPLETED] LORazepam      LORazepam 0.5 mg Oral Once   Or      LORazepam 1 mg Intramuscular Once   LORazepam 1 mg Oral Once   metoprolol 25 mg Oral Once   metoprolol 50 mg Oral Q12H SCH   [COMPLETED] midazolam      [COMPLETED] midazolam      nitroglycerin 1 patch Transdermal Q24H   [COMPLETED] nitroglycerin      nystatin 1 application Topical Q12H SCH   pantoprazole 40 mg Oral QAM AC   [COMPLETED] prasugrel HCl      prasugrel 10 mg Oral Daily   sodium chloride (PF) 3 mL Intravenous Q8H   [DISCONTINUED] aspirin 81 mg Oral Daily     Continuous Infusions:       . sodium chloride 100 mL/hr at 02/27/13 0055   . [EXPIRED] sodium chloride 125 mL/hr at 02/27/13 1552   . [EXPIRED] nitroglycerin     . [DISCONTINUED] sodium chloride       PRN Meds:  acetaminophen, ALPRAZolam, dextrose, dextrose, dextrose, dextrose, glucagon (rDNA), glucagon (rDNA), insulin aspart, [COMPLETED] iodixanol, nitroglycerin, oxyCODONE-acetaminophen, oxyCODONE-acetaminophen, zolpidem    Objective:  Physical Exam:  BP 145/59  Pulse 77  Temp 98.2 F (36.8 C) (Oral)  Resp 20  Ht 1.803 m (5\' 11" )  Wt 126.1 kg (278 lb)  BMI 38.79 kg/m2  SpO2 93%   Intake/Output Summary (Last 24 hours) at 02/28/13 1339  Last data filed at 02/28/13 1610   Gross per 24 hour   Intake    340 ml   Output    825 ml   Net   -485 ml     General: awake, alert, oriented x 3; no acute distress.  Cardiovascular:  regular rate and rhythm, no murmurs, rubs or gallops  Lungs: clear to auscultation bilaterally, without wheezing,  rhonchi, or rales  Abdomen: soft, non-tender, non-distended, no hepatosplenomegaly, normoactive bowel sounds  Extremities:   no clubbing, cyanosis, or edema, 2+ DP b/l; right groin c/d/i, no hematoma/thrill/bruit, b/l groin yeast infection rash below insertion site.    Lab Review:   Recent Labs   Basename 02/28/13 0415 02/27/13 0515 02/26/13 1647    WBC 9.38 8.54 7.03    HGB 13.0 14.6 14.4    HCT 39.3* 44.9 44.8    PLT 205 228 225     Recent Labs   Basename 02/27/13 1052 02/27/13 0515 02/26/13 2325 02/26/13 1641    TROPI 1.26* 1.51* 0.88* --    ISTATTROPONI -- -- -- 0.07    CK 146 -- -- --    CKMB -- -- -- --    BNP -- -- -- --    Recent Labs   Basename 02/28/13 0415 02/27/13 1045 02/27/13 0515 02/26/13 1647    NA 137 -- 138 137    K  3.9 -- 3.7 3.8    CO2 22 -- 22 24    BUN 13.0 -- 9.0 13.0    CREAT 0.8 -- 0.8 0.9    GLU 145* -- 146* 274*    MG -- 2.3 -- --     Recent Labs   Basename 02/27/13 1045 02/26/13 2325    INR -- --    TSH 3.40 --    CHOL -- 159    TRIG -- 246*    HDL -- 27*    LDL -- 83        Telemetry: SR    Imaging:  Radiology Results (24 Hour)     ** No Results found for the last 24 hours. **          Assessment/Plan:   1. CAD: s/p LAD stent with good result. LV gram with normal systolic function. Pt asymptomatic and groin benign. Ok to complete echo as an outpt. D/C on Aspirin, Effient 10mg  daily, Toprol XL 100, lisinopril 10mg , lipitor 80mg   2. HTN: Bp still elevated. Will increase lisinopril to 10mg  on discharge. Will adjust further as outpt.  3. DM: new diagnosis, planper primary team    Follow up in the office with NP in 1-2 weeks and then with Dr. Jeanne Ivan in a few months. Echo to be done as an outpt.    Signed by: Cheri Kearns, MD, [facility] [phone] or [phone] (M-F from 8AM-5PM)  Office [phone]

## 2013-02-28 NOTE — Discharge Summary (Signed)
PROGRESS NOTE/DISCHARGE SUMMARY      Date Time: 02/28/2013 2:16 PM  Patient Name: William Donaldson  Attending Physician: Miki Kins, MD  Primary Care Physician: Ival Bible, MD    Date of Admission:   02/26/2013    Date of Discharge:   02/28/2013    Discharge Dx:     Patient Active Problem List    Diagnosis Date Noted   . Chest pain 02/27/2013   . Chest pressure 02/26/2013   . SOB (shortness of breath) 02/26/2013   . Elevated blood-pressure reading without diagnosis of hypertension 02/26/2013   . Hyperglycemia 02/26/2013   . Hyperlipidemia 02/26/2013   . Obesity 02/26/2013        Consultations:   Treatment Team: Attending Provider: Miki Kins, MD; Technician: Farrel Gordon; Nurse Practitioner: Odessa Fleming, NP; Registered Nurse: Gillermina Phy, RN     Procedures/Radiology performed:   XR CHEST AP PORTABLE  LEFT HEART CATH POSS PCI  Radiology Results (24 Hour)     ** No Results found for the last 24 hours. **        Results     Procedure Component Value Units Date/Time    Hemoglobin A1C [[id number]]  (Abnormal) Collected:02/28/13 0415    Specimen Information:Blood Updated:02/28/13 1226     Hemoglobin A1C 7.2 (H) %     POCT glucose (AC and HS) [[id number]]  (Abnormal) Collected:02/28/13 1110     POCT Glucose WB 216 (A) mg/dL NWGNFAO:13/08/65 7846    POCT glucose (AC and HS) [[id number]]  (Abnormal) Collected:02/28/13 0741     POCT Glucose WB 160 (A) mg/dL LKGMWNU:27/25/36 6440    CBC [[id number]]  (Abnormal) Collected:02/28/13 0415    Specimen Information:Blood / Blood Updated:02/28/13 0533     WBC 9.38      RBC 4.48 (L)      Hgb 13.0 g/dL      Hematocrit 38.7 (L) %      MCV 87.7 fL      MCH 29.0 pg      MCHC 33.1 g/dL      RDW 14 %      Platelets 205      MPV 10.5 fL      Nucleated RBC 0     Narrative:    QAMLAB post PCI x 1  In the AM  If not ordered in ED    GFR [[id number]] Collected:02/28/13 0415     EGFR >60.0 Updated:02/28/13 0525    Narrative:    QAMLAB post PCI x 1  In the AM  If not  ordered in ED    Basic metabolic panel [[id number]]  (Abnormal) Collected:02/28/13 0415    Specimen Information:Blood Updated:02/28/13 0525     Glucose 145 (H) mg/dL      BUN 01.6 mg/dL      Creatinine 0.8 mg/dL      CALCIUM 8.4 (L) mg/dL      Sodium 010      Potassium 3.9      Chloride 105      CO2 22     Narrative:    QAMLAB post PCI x 1  In the AM  If not ordered in ED    POCT glucose (AC and HS) [[id number]]  (Abnormal) Collected:02/27/13 2200     POCT Glucose WB 147 (A) mg/dL KGURKYH:03/10/75 2831    POCT glucose (AC and HS) [[id number]]  (Abnormal) Collected:02/27/13 1549     POCT Glucose WB 112 (A) mg/dL XTGGYIR:48/54/62 7035    TSH [  [id number]] Collected:02/27/13 1045    Specimen Information:Blood Updated:02/27/13 1358     Thyroid Stimulating Hormone 3.40     Narrative:    Blood in lab    Magnesium [[id number]] Collected:02/27/13 1045    Specimen Information:Blood Updated:02/27/13 1330     Magnesium 2.3 mg/dL     Narrative:    Blood in lab    POCT glucose (AC and HS) [[id number]]  (Abnormal) Collected:02/27/13 1203     POCT Glucose WB 135 (A) mg/dL YQMVHQI:69/62/95 2841    Troponin I [[id number]]  (Abnormal) Collected:02/27/13 1052    Specimen Information:Blood Updated:02/27/13 1132     Troponin I 1.26 (H) ng/mL     Creatine Kinase (CK) [[id number]] Collected:02/27/13 1052    Specimen Information:Blood Updated:02/27/13 1129     Creatine Kinase (CK) 146 U/L     POCT glucose (AC and HS) [[id number]]  (Abnormal) Collected:02/27/13 0722     POCT Glucose WB 155 (A) mg/dL OVFIEPP:29/51/88 4166    Basic metabolic panel [[id number]]  (Abnormal) Collected:02/27/13 0515    Specimen Information:Blood Updated:02/27/13 0557     Glucose 146 (H) mg/dL      BUN 9.0 mg/dL      Creatinine 0.8 mg/dL      CALCIUM 8.7 mg/dL      Sodium 932      Potassium 3.7      Chloride 106      CO2 22     Narrative:    If not ordered in ED    GFR [[id number]] Collected:02/27/13 0515     EGFR >60.0 Updated:02/27/13 0557    Narrative:    If not ordered in  ED    Troponin I [[id number]]  (Abnormal) Collected:02/27/13 0515    Specimen Information:Blood Updated:02/27/13 0557     Troponin I 1.51 (H) ng/mL     CBC [[id number]] Collected:02/27/13 0515    Specimen Information:Blood / Blood Updated:02/27/13 0540     WBC 8.54      RBC 5.10      Hgb 14.6 g/dL      Hematocrit 16.0 %      MCV 88.0 fL      MCH 28.6 pg      MCHC 32.5 g/dL      RDW 14 %      Platelets 228      MPV 10.6 fL      Nucleated RBC 0     Narrative:    If not ordered in ED    Lipid panel (Fasting) [[id number]]  (Abnormal) Collected:02/26/13 2325    Specimen Information:Blood Updated:02/27/13 0321     Cholesterol 159 mg/dL      Triglycerides 485 (H) mg/dL      HDL 27 (L) mg/dL      LDL Calculated 83 mg/dL      VLDL Cholesterol Cal 49 (H) mg/dL      CHOL/HDL Ratio 5.9     Hemolyzed Index [[id number]]  (Abnormal) Collected:02/26/13 2325     Hemolyzed Index 28 (H) Updated:02/27/13 0321    Hemoglobin A1C [[id number]]  (Abnormal) Collected:02/26/13 2325     Hemoglobin A1C 7.2 (H) % Updated:02/27/13 0136    Troponin I [[id number]]  (Abnormal) Collected:02/26/13 2325    Specimen Information:Blood Updated:02/27/13 0003     Troponin I 0.88 (H) ng/mL     POCT glucose (AC and HS) [[id number]]  (Abnormal) Collected:02/26/13 2335    Specimen Information:Blood Updated:02/26/13 2335     POCT Glucose WB 157 (A) mg/dL     Basic Metabolic Panel (  BMP) [[id number]]  (Abnormal) Collected:02/26/13 1647    Specimen Information:Blood Updated:02/26/13 1758     Glucose 274 (H) mg/dL      BUN 54.0 mg/dL      Creatinine 0.9 mg/dL      CALCIUM 9.1 mg/dL      Sodium 981      Potassium 3.8      Chloride 104      CO2 24     GFR [[id number]] Collected:02/26/13 1647     EGFR >60.0 Updated:02/26/13 1758    CBC with Differential [[id number]] Collected:02/26/13 1647    Specimen Information:Blood / Blood Updated:02/26/13 1746     WBC 7.03      RBC 5.12      Hgb 14.4 g/dL      Hematocrit 57.8 %      MCV 87.5 fL      MCH 28.1 pg      MCHC 32.1 g/dL      RDW 14  %      Platelets 225      MPV 11.3 fL      Neutrophils 62 %      Lymphocytes Automated 28 %      Monocytes 7 %      Eosinophils Automated 2 %      Basophils Automated 0 %      Immature Granulocyte 0 %      Nucleated RBC 0      Neutrophils Absolute 4.36      Abs Lymph Automated 1.98      Abs Mono Automated 0.52      Abs Eos Automated 0.14      Absolute Baso Automated 0.02      Absolute Immature Granulocyte 0.01     i-Stat Troponin [[id number]] Collected:02/26/13 1641     i-STAT Troponin 0.07 ng/mL Updated:02/26/13 1654            Hospital Course:   Mr. Denn is a 57 y/o w/ h/o DM, HLP, HTN who presents w/ a NSTEMI.  On PCI he was found to have a 95% mid LAD lesion which was stented w/ a DES.  He tolerated the procedure well.  He was also diagnosed w/ new onset DM.  He had some DM education, and will start metformin as an outpt.  He will need close outpt f/u prior to d/c.        DISCHARGE DAY EXAM:  Patient Vitals for the past 24 hrs:   BP Temp Temp src Pulse Resp SpO2 Weight   02/28/13 0732 145/59 mmHg - - 77  - 93 % -   02/28/13 0729 153/71 mmHg 98.2 F (36.8 C) - 73  20  93 % -   02/28/13 0614 - - - - - - 126.1 kg (278 lb)   02/28/13 0443 125/76 mmHg 44.8 F (7.1 C) Oral - 18  96 % -   02/27/13 2310 128/67 mmHg 98.1 F (36.7 C) Oral 65  19  95 % -   02/27/13 2017 139/56 mmHg - - - - 97 % -   02/27/13 1900 151/71 mmHg - - 65  - 96 % -   02/27/13 1750 128/65 mmHg - - 66  - 94 % -   02/27/13 1739 137/63 mmHg - - 63  - - -   02/27/13 1700 147/71 mmHg - - 61  18  - -   02/27/13 1647 142/70 mmHg - - 69  - 98 % -   02/27/13 1635 147/63  mmHg - - 95  18  - -   02/27/13 1616 139/66 mmHg - - 63  - 95 % -   02/27/13 1600 139/69 mmHg - - - 18  - -   02/27/13 1549 132/66 mmHg 96.3 F (35.7 C) - 63  18  95 % -     Body mass index is 38.79 kg/(m^2).  General: awake, alert, oriented x 3; no acute distress.  Cardiovascular: regular rate and rhythm, no murmurs, rubs or gallops  Lungs: clear to auscultation bilaterally, without  wheezing, rhonchi, or rales  Abdomen: soft, non-tender, non-distended; no palpable masses, no hepatosplenomegaly, normoactive bowel sounds, no rebound or guarding  Extremities: no clubbing, cyanosis, or edema  Skin - candidal rash of pannus    Discharge Medications:     Current Discharge Medication List      START taking these medications    Details   atorvastatin (LIPITOR) 80 MG tablet Take 1 tablet (80 mg total) by mouth daily.  Qty: 30 tablet, Refills: 3      lisinopril (PRINIVIL,ZESTRIL) 10 MG tablet Take 1 tablet (10 mg total) by mouth daily.  Qty: 1 tablet, Refills: 0      metFORMIN (GLUCOPHAGE) 500 MG tablet Take 1 tablet (500 mg total) by mouth every morning with breakfast.  Qty: 30 tablet, Refills: 0      metoprolol XL (TOPROL-XL) 100 MG 24 hr tablet Take 1 tablet (100 mg total) by mouth daily.  Qty: 30 tablet, Refills: 0      nitroglycerin (NITROSTAT) 0.4 MG SL tablet Place 1 tablet (0.4 mg total) under the tongue every 5 (five) minutes as needed for Chest pain.  Qty: 20 tablet, Refills: 0      nystatin (NYSTOP) 100000 UNIT/GM Powder Apply 1 application topically every 12 (twelve) hours.  Qty: 30 g, Refills: 1      prasugrel HCl (EFFIENT) 10 MG Tab Take 1 tablet (10 mg total) by mouth daily.  Qty: 30 tablet, Refills: 3         CONTINUE these medications which have NOT CHANGED    Details   aspirin EC 81 MG EC tablet Take 81 mg by mouth daily.               Discharge Instructions:   F/u w Dr Jeanne Ivan  F/u w/ primary doc    Disposition:  home    Patient was instructed to follow up with Primary Care Doctor Ival Bible, MD in 2 days  Minutes spent coordinating discharge and reviewing discharge plan:35 minutes      Signed by: Miki Kins, MD    CC: Ival Bible, MD

## 2013-03-01 LAB — ECG 12-LEAD
Atrial Rate: 79 {beats}/min
P Axis: 49 degrees
P-R Interval: 160 ms
Q-T Interval: 446 ms
QRS Duration: 98 ms
QTC Calculation (Bezet): 511 ms
R Axis: 6 degrees
T Axis: -29 degrees
Ventricular Rate: 79 {beats}/min

## 2013-03-10 ENCOUNTER — Ambulatory Visit: Payer: BLUE CROSS/BLUE SHIELD | Attending: Hospitalist

## 2013-03-10 VITALS — Ht 71.0 in | Wt 272.2 lb

## 2013-03-10 DIAGNOSIS — IMO0001 Reserved for inherently not codable concepts without codable children: Secondary | ICD-10-CM | POA: Insufficient documentation

## 2013-03-10 NOTE — Progress Notes (Signed)
Weight Category: obese  Last Vitals: Ht 1.803 m (5\' 11" )  Wt 123.469 kg (272 lb 3.2 oz)  BMI 37.98 kg/m2    Comments: patient newly diagnosed diabetes type 2, today's visit to prepare for Class 1 LWWD tomorrow.     The Educator has assessed his overall outcomes of learning so far today to be:     Partial/Still Learning  The Educator has noticed there may be some Barriers to learning related to the following:    None Identified  Nutrition and food topics covered in class today included:    Carbohydrate Effect on BG  Physical activity was encouraged and some topics related to discussion included:    Benefits of Activity    Getting Started  Monitoring blood glucose was reviewed and included:    How To Use A Glucometer    Test Target Ranges    Recording Blood Glucose   An individualized goal was created by the patient for self-improvement within the following general category: Activity

## 2013-03-11 ENCOUNTER — Ambulatory Visit: Payer: BLUE CROSS/BLUE SHIELD

## 2013-03-11 VITALS — Ht 71.0 in | Wt 273.0 lb

## 2013-03-11 NOTE — Progress Notes (Signed)
William Donaldson attended the first class of the Living Well with Type 2 Diabetes Series today.  The individual(s) attending were:      Patient only  The Educator has assessed their overall outcome of learning so far today to be:     Partial/Still Learning  The Educator has noticed there may be some Barriers to learning related to the following:    None Identified  Nutrition and food topics covered in class today included:    Carbohydrate Effect on BG    Carbohydrate Counting    Meal Planning    Reading Food Labels    Recording Food Intake  Physical activity was encouraged and some topics related to discussion included:    Benefits of Activity    Exercise    Getting Started  General aspects of common diabetes medications were discussed, including:    Common Oral Medication Names/Classes     How and Where They Work in the Body  Monitoring blood glucose was reviewed and included:    How To Use A Glucometer-retaught from yesterday as he was having trouble getting enough blood on his strip    Test Target Ranges    Recording Blood Glucose   An individualized goal was created by the patient for self-improvement within the following general category: Activity      Comments:

## 2013-03-17 ENCOUNTER — Encounter (INDEPENDENT_AMBULATORY_CARE_PROVIDER_SITE_OTHER): Payer: Self-pay | Admitting: Cardiovascular Disease

## 2013-03-17 ENCOUNTER — Ambulatory Visit (INDEPENDENT_AMBULATORY_CARE_PROVIDER_SITE_OTHER): Payer: BLUE CROSS/BLUE SHIELD | Admitting: Cardiovascular Disease

## 2013-03-17 VITALS — BP 128/70 | HR 71 | Ht 71.0 in | Wt 272.0 lb

## 2013-03-17 MED ORDER — PRASUGREL HCL 10 MG PO TABS
10.0000 mg | ORAL_TABLET | Freq: Every day | ORAL | Status: AC
Start: 2013-03-17 — End: ?

## 2013-03-17 MED ORDER — ATORVASTATIN CALCIUM 80 MG PO TABS
80.00 mg | ORAL_TABLET | Freq: Every day | ORAL | Status: DC
Start: 2013-03-17 — End: 2013-06-18

## 2013-03-17 MED ORDER — LISINOPRIL 10 MG PO TABS
10.0000 mg | ORAL_TABLET | Freq: Every day | ORAL | Status: DC
Start: 2013-03-17 — End: 2013-05-03

## 2013-03-17 NOTE — Progress Notes (Signed)
[facility] - PROSPERITY OFFICE VISIT      Mr. William Donaldson presented today for cardiovascular follow up. He is a 57 y.o. with Single vessel CAD status post mid LAD drug-eluting stent 2 weeks ago for small non-Q. MI/ACS; hypertension; hyperlipidemia and Type 2 DM. He presented to [facility] on February 27, 2013 with prolonged chest pain following a meal. He was hypertensive on arrival. He ruled in for a small non-study. Cardiac catheterization next day demonstrated single-vessel CAD involving the LAD successfully stented and normal left ventricular ejection fraction. He has done well since then with no chest pains, chest discomfort, shortness of breath, palpitations or lightheadedness. He has no groin pain. He is taking all his medications as prescribed    Current outpatient prescriptions:ACCU-CHEK FASTCLIX LANCETS Misc, , Disp: , Rfl: ;  aspirin EC 81 MG EC tablet, Take 81 mg by mouth daily., Disp: , Rfl: ;  atorvastatin (LIPITOR) 80 MG tablet, Take 1 tablet (80 mg total) by mouth daily., Disp: 30 tablet, Rfl: 3;  lisinopril (PRINIVIL,ZESTRIL) 10 MG tablet, Take 1 tablet (10 mg total) by mouth daily., Disp: 30 tablet, Rfl: 0  metFORMIN (GLUCOPHAGE) 500 MG tablet, Take 1 tablet (500 mg total) by mouth every morning with breakfast., Disp: 30 tablet, Rfl: 0;  metoprolol XL (TOPROL-XL) 100 MG 24 hr tablet, Take 1 tablet (100 mg total) by mouth daily., Disp: 30 tablet, Rfl: 0;  nystatin (MYCOSTATIN) powder, , Disp: , Rfl: ;  prasugrel HCl (EFFIENT) 10 MG Tab, Take 1 tablet (10 mg total) by mouth daily., Disp: 30 tablet, Rfl: 3  nitroglycerin (NITROSTAT) 0.4 MG SL tablet, Place 1 tablet (0.4 mg total) under the tongue every 5 (five) minutes as needed for Chest pain., Disp: 20 tablet, Rfl: 0    Review of Systems: All other systems were reviewed and were negative except as stated above.    Physical Examination:   General Appearance:  A well-appearing male in no acute distress.   Vital Signs: BP 128/70  Pulse 71  Ht 1.803 m (5'  11")  Wt 123.378 kg (272 lb)  BMI 37.95 kg/m2   HEENT: Normocephalic, atraumatic. EOMI,sclera anicteric, conjunctiva without pallor. Moist mucus membranes, oropharynx clear, normal dentition  Neck:  Supple without jugular venous distention. Thyroid nonpalpable. Normal carotid upstroke without bruits.  Chest: Clear anteriorly and posteriorly without rales, rhonchi or wheezes  Cardiovascular: Regular rate and rhythm; normal S1 and S2 without gallop, murmur or rub. PMI of normal intensity and not displaced  Abdomen:   +BS, soft nontender without organomegaly, masses , abnormal pulsations or bruits.  Extremities: without clubbing, cyanosis or edema. Intact peripheral pulses without bruits. Right groin with fading candidal rash. 2+ femoral pulse without bruit or hematoma.  Skin:  Improving candida rash both groins, no  xanthoma or xanthelasma.   Neuro: Alert and oriented X3. Normal mood and affect. Cranial nerves grossly intact. Normal symmetric strength 4 extremities. Normal gait.  ECG: NSR, 71. Anterolateral T-wave inversions, c/w prior NQMI, improved c/w prior EKG 02/28/13.     Impression:  William Donaldson is a 57 y.o. male who presents for follow up.    1. Single vessel CAD: s/p LAD DES 02/27/13.  2. S/P small non STEMI with  normal systolic function.  3. Hypertension, controlled  4. Hyperlipidemia  5. Type 2 DM  6. Resolving candidal bilateral groin rash  Recommendations:  Continue aspirin 81 mg daily and  Effient 10 mg daily for CAD status post LAD DES. Cannot interrupt antiplatelet therapy  for one year post stenting  Continue Lipitor 80 mg daily for hyperlipidemia  Start coenzyme Q10 200-300 mg daily to prevent statin-induced myalgias  Continue Toprol-XL 100 mg daily and lisinopril 10 mg daily for hypertension  Continue nitroglycerin 0.4 mg sublingual p.r.n. Chest pain, which the patient has not required  Continue metformin 500 mg daily for type 2 diabetes  Continue Mycostatin powder to groin bilaterally for  candidal rash  Referred patient to cardiac rehabilitation. He is in the low risk category and does not require a qualifying treadmill stress test to start cardiac rehabilitation  Followup visit 3 months  Fasting labs before next visit  Joseph Pierini, MD, [facility], [facility]

## 2013-03-17 NOTE — Patient Instructions (Addendum)
Start Coenzyme Q 10 200-300 mg daily  Continue all other medications  Follow up in 3 months  Get fasting labs prior to follow up visit

## 2013-03-31 ENCOUNTER — Telehealth (INDEPENDENT_AMBULATORY_CARE_PROVIDER_SITE_OTHER): Payer: Self-pay

## 2013-03-31 NOTE — Telephone Encounter (Signed)
Nurse Misty Stanley is aware

## 2013-03-31 NOTE — Telephone Encounter (Signed)
The nurse called regarding the patient wondering if the patient had any restriction on exercise. Left message to call back. Per Dr. Jeanne Ivan, there are no restrictions.

## 2013-04-01 ENCOUNTER — Ambulatory Visit: Payer: BLUE CROSS/BLUE SHIELD | Attending: Cardiovascular Disease

## 2013-04-01 VITALS — Ht 71.0 in | Wt 268.8 lb

## 2013-04-01 DIAGNOSIS — IMO0001 Reserved for inherently not codable concepts without codable children: Secondary | ICD-10-CM | POA: Insufficient documentation

## 2013-04-01 NOTE — Progress Notes (Signed)
William Donaldson attended the second class of the Living Well with Type 2 Diabetes Series today.  The individual(s) attending were:      Patient only  The Educator assessed their overall outcome of learning upon completion of the classes to be:     Full/Learned  The Educator has noticed there may be some barriers to learning related to the following:    None Identified  Nutrition and food topics covered in class today included:    Plate Method     Dining Out    Portion Control for Weight Loss    Heart Healthy Nutrition    Alcohol   Physical activity was encouraged and some topics related to discussion included:    Special Considerations    Safety and Injury Prevention    For Weight Loss  Monitoring blood glucose was reviewed and included:    A1C Test, Results, and Goal    Estimated Average Glucose Conversion    During Activity  Problem solving skills were addressed in alternative situations, including:     Blood Glucose Pattern Management Identification    Re-assessing Goals and Barriers     Treating Highs and Lows    Care During Sick Days  Discussion of self-care behaviors to reduce the risk of developing complications included:    Regular Tests and Vaccines Schedule    Foot Care    Eye Care    Gum Care  Emotional well-being and managing Diabetes in the long-term were discussed and included:    Life Balance    Depression, Stress, Burnout    Getting Support    Comments:  Doing well.

## 2013-05-03 ENCOUNTER — Other Ambulatory Visit (INDEPENDENT_AMBULATORY_CARE_PROVIDER_SITE_OTHER): Payer: Self-pay | Admitting: Cardiovascular Disease

## 2013-06-02 LAB — COMPREHENSIVE METABOLIC PANEL
ALT: 25 U/L (ref 9–46)
AST (SGOT): 19 U/L (ref 10–35)
Albumin/Globulin Ratio: 1.5 (ref 1.0–2.5)
Albumin: 4.3 G/DL (ref 3.6–5.1)
Alkaline Phosphatase: 76 U/L (ref 40–115)
BUN: 12 MG/DL (ref 7–25)
Bilirubin, Total: 1.1 MG/DL (ref 0.2–1.2)
CO2: 27 mmol/L (ref 19–30)
Calcium: 9.6 MG/DL (ref 8.6–10.3)
Chloride: 106 mmol/L (ref 98–110)
Creatinine: 0.82 mg/dL (ref 0.70–1.33)
EGFR African American: 113 mL/min/{1.73_m2} (ref >=60)
EGFR: 97 mL/min/{1.73_m2} (ref >=60)
Globulin: 2.8 G/DL (ref 1.9–3.7)
Glucose: 96 MG/DL (ref 65–99)
Potassium: 4 mmol/L (ref 3.5–5.3)
Protein, Total: 7.1 G/DL (ref 6.1–8.1)
Sodium: 142 mmol/L (ref 135–146)

## 2013-06-02 LAB — CBC AND DIFFERENTIAL
Atypical Lymphocytes %: 0 %
Baso(Absolute): 34 cells/uL (ref 0–200)
Basophils: 0.6 %
Eosinophils Absolute: 73 cells/uL (ref 15–500)
Eosinophils: 1.3 %
Hematocrit: 43.5 % (ref 38.5–50.0)
Hemoglobin: 14.3 g/dL (ref 13.2–17.1)
Lymphocytes Absolute: 1910 cells/uL (ref 850–3900)
Lymphocytes: 34.1 %
MCH: 29.1 pg (ref 27–33)
MCHC: 32.8 g/dL (ref 32–36)
MCV: 89 fL (ref 80–100)
MPV: 9.3 fL (ref 7.5–11.5)
Monocytes Absolute: 347 cells/uL (ref 200–950)
Monocytes: 6.2 %
Neutrophils Absolute: 3237 cells/uL (ref 1500–7800)
Neutrophils: 57.8 %
Platelets: 221 10*3/uL (ref 140–400)
RBC: 4.9 10*6/uL (ref 4.20–5.80)
RDW: 15 % (ref 11.0–15.0)
WBC: 5.6 10*3/uL (ref 3.8–10.8)

## 2013-06-02 LAB — LIPID PANEL
Cholesterol / HDL Ratio: 2.8 (ref 0.0–5.0)
Cholesterol: 87 MG/DL — ABNORMAL LOW (ref 125–200)
HDL: 31 mg/dL — AB (ref >=40)
LDL Calculated: 30 mg/dL (ref <130)
Non HDL Cholesterol (LDL and VLDL): 56 mg/dL
Triglycerides: 131 MG/DL (ref <150)

## 2013-06-02 LAB — CK: Creatinine Kinase, Total: 102 U/L (ref 44–196)

## 2013-06-04 ENCOUNTER — Other Ambulatory Visit (INDEPENDENT_AMBULATORY_CARE_PROVIDER_SITE_OTHER): Payer: Self-pay | Admitting: Hospitalist

## 2013-06-18 ENCOUNTER — Other Ambulatory Visit (INDEPENDENT_AMBULATORY_CARE_PROVIDER_SITE_OTHER): Payer: Self-pay

## 2013-06-18 ENCOUNTER — Ambulatory Visit (INDEPENDENT_AMBULATORY_CARE_PROVIDER_SITE_OTHER): Payer: BLUE CROSS/BLUE SHIELD | Admitting: Cardiovascular Disease

## 2013-06-18 ENCOUNTER — Encounter (INDEPENDENT_AMBULATORY_CARE_PROVIDER_SITE_OTHER): Payer: Self-pay | Admitting: Cardiovascular Disease

## 2013-06-18 VITALS — BP 126/64 | HR 60 | Ht 71.0 in | Wt 258.0 lb

## 2013-06-18 MED ORDER — ATORVASTATIN CALCIUM 40 MG PO TABS
40.00 mg | ORAL_TABLET | Freq: Every day | ORAL | Status: AC
Start: 2013-06-18 — End: ?

## 2013-06-18 MED ORDER — METOPROLOL SUCCINATE ER 100 MG PO TB24
100.0000 mg | ORAL_TABLET | Freq: Every day | ORAL | Status: DC
Start: 2013-06-18 — End: 2013-06-18

## 2013-06-18 MED ORDER — LISINOPRIL 10 MG PO TABS
10.0000 mg | ORAL_TABLET | Freq: Every day | ORAL | Status: DC
Start: 2013-06-18 — End: 2013-07-28

## 2013-06-18 MED ORDER — METOPROLOL SUCCINATE ER 100 MG PO TB24
100.00 mg | ORAL_TABLET | Freq: Every day | ORAL | Status: DC
Start: 2013-06-18 — End: 2013-12-17

## 2013-06-18 NOTE — Progress Notes (Signed)
[facility] - PROSPERITY OFFICE VISIT      William Donaldson presented today for cardiovascular follow up. He is a 57 y.o. 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 MI/ACS; hypertension; hyperlipidemia and Type 2 DM.He has done well since then with no chest pains, chest discomfort, shortness of breath, palpitations or lightheadedness. At his last visit he had some mild myalgias and we started coenzyme Q 10. Since then he's had no myalgias or muscle aches. He is exercising with walking every day without any chest pain or shortness of breath. He is following Weight Watchers and eating a low-fat low-carb diet. He is losing weight.    Current outpatient prescriptions:ACCU-CHEK FASTCLIX LANCETS Misc, , Disp: , Rfl: ;  ACCU-CHEK SMARTVIEW test strip, , Disp: , Rfl: ;  aspirin EC 81 MG EC tablet, Take 81 mg by mouth daily., Disp: , Rfl: ;  lisinopril (PRINIVIL,ZESTRIL) 10 MG tablet, Take 1 tablet (10 mg total) by mouth daily., Disp: 90 tablet, Rfl: 3;  metFORMIN (GLUCOPHAGE) 500 MG tablet, Take 1 tablet (500 mg total) by mouth every morning with breakfast., Disp: 30 tablet, Rfl: 0  metoprolol (TOPROL-XL) 100 MG 24 hr tablet, Take 1 tablet (100 mg total) by mouth daily., Disp: 90 tablet, Rfl: 3;  nitroglycerin (NITROSTAT) 0.4 MG SL tablet, Place 1 tablet (0.4 mg total) under the tongue every 5 (five) minutes as needed for Chest pain., Disp: 20 tablet, Rfl: 0;  nystatin (MYCOSTATIN) powder, , Disp: , Rfl: ;  prasugrel HCl (EFFIENT) 10 MG Tab, Take 1 tablet (10 mg total) by mouth daily., Disp: 90 tablet, Rfl: 3  [DISCONTINUED] atorvastatin (LIPITOR) 80 MG tablet, Take 1 tablet (80 mg total) by mouth daily., Disp: 90 tablet, Rfl: 3;  [DISCONTINUED] lisinopril (PRINIVIL,ZESTRIL) 10 MG tablet, TAKE 1 TABLET (10 MG TOTAL) BY MOUTH DAILY., Disp: 90 tablet, Rfl: 0;  [DISCONTINUED] metoprolol XL (TOPROL-XL) 100 MG 24 hr tablet, Take 1 tablet (100 mg total) by mouth daily., Disp: 30 tablet, Rfl: 0  atorvastatin  (LIPITOR) 40 MG tablet, Take 1 tablet (40 mg total) by mouth daily., Disp: 90 tablet, Rfl: 3    Review of Systems: All other systems were reviewed and were negative except as stated above.    Physical Examination:   General Appearance:  A well-appearing male in no acute distress.   Vital Signs: BP 126/64  Pulse 60  Ht 1.803 m (5\' 11" )  Wt 117.028 kg (258 lb)  BMI 36.00 kg/m2  Weight down 14 pounds from last visit 12 weeks ago  HEENT: Normocephalic, atraumatic. EOMI,sclera anicteric, conjunctiva without pallor. Moist mucus membranes, oropharynx clear, normal dentition  Neck:  Supple without jugular venous distention. Thyroid nonpalpable. Normal carotid upstroke without bruits.  Chest: Clear anteriorly and posteriorly without rales, rhonchi or wheezes  Cardiovascular: Regular rate and rhythm; normal S1 and S2 without gallop, murmur or rub. PMI of normal intensity and not displaced  Abdomen:   +BS, soft nontender without organomegaly, masses , abnormal pulsations or bruits.  Extremities: without clubbing, cyanosis or edema. Intact peripheral pulses without bruits.  Neuro: Alert and oriented X3. Normal mood and affect. Cranial nerves grossly intact. Normal symmetric strength 4 extremities. Normal gait.  LAB:      Component      Latest Ref Rng 06/01/2013   Sodium      135 - 146 mmol/L 142   Potassium      3.5 - 5.3 mmol/L 4.0   Chloride  98 - 110 mmol/L 106   CO2      19 - 30 mmol/L 27   Glucose      65 - 99 MG/DL 96   BUN      7 - 25 MG/DL 12   Creatinine      1.61 - 1.33 mg/dL 0.96   BUN/Creatinine Ratio      6 - 22 N/A   Calcium      8.6 - 10.3 MG/DL 9.6   Protein, Total      6.1 - 8.1 G/DL 7.1   Albumin      3.6 - 5.1 G/DL 4.3   Globulin      1.9 - 3.7 G/DL 2.8   Albumin/Globulin Ratio      1.0 - 2.5 1.5   Bilirubin, Total      0.2 - 1.2 MG/DL 1.1   AST (SGOT)      10 - 35 U/L 19   ALT      9 - 46 U/L 25   Alkaline Phosphatase      40 - 115 U/L 76   EGFR      > OR = 60 mL/min/1.3m2 97   AFRICAN AMERICAN  EGFR      > OR = 60 mL/min/1.32m2 113   Cholesterol      125 - 200 MG/DL 87 (L)   Triglycerides      <150 MG/DL 045   HDL      > or = 40 mg/dL 31 (A)   Calculated LDL      <130 mg/dL 30   CHOL/HDL Ratio      0.0 - 5.0 2.8   Non HDL Chol. (LDL+VLDL)       56     Component      Latest Ref Rng 06/01/2013   WBC      3.8 - 10.8 Thousand/uL 5.6   RBC      4.20 - 5.80 Million/uL 4.90   Hemoglobin      13.2 - 17.1 g/dL 40.9   Hematocrit      38.5 - 50.0 % 43.5   MCV      80 - 100 fL 89   Platelet Count      140 - 400 Thousand/uL 221     Impression:  William Donaldson is a 57 y.o. male who presents for follow up.    1. Single vessel CAD: s/p LAD DES 02/27/13.  2. S/P small non STEMI with  normal systolic function.  3. Hypertension, controlled  4. Hyperlipidemia  5. Type 2 DM    Recommendations:  As cholesterol is quite low we can decrease the Lipitor. He will decrease Lipitor to 80 mg every other day until he runs out of 80 mg tablets. He will then switch to Lipitor 40 mg daily.  Continue coenzyme Q10 200 mg daily to prevent statin-induced myalgias  Continue aspirin 81 mg daily and  Effient 10 mg daily for CAD status post LAD DES. Cannot interrupt antiplatelet therapy for one year post stenting  Continue Toprol-XL 100 mg daily and lisinopril 10 mg daily for hypertension  Continue nitroglycerin 0.4 mg sublingual p.r.n. Chest pain, which the patient has not required  Continue metformin 500 mg daily for type 2 diabetes  Keep up the great work  with weight loss, healthy diet, daily exercise and weight watchers  Followup visit 6 months  Fasting labs before next visit  Joseph Pierini, MD, [facility], [facility]

## 2013-06-18 NOTE — Patient Instructions (Signed)
Keep up the great work with healthy diet and exercise  Your weight is down 14 lbs from 12 weeks ago  Labs all look excellent  Can decrease Lipitor to 80 mg every other day until you run out then go to 40 mg a day once you run out of 80 mg pills  Follow up visit in 6 months  Fasting labs before next visit  Happy Holidays

## 2013-06-25 LAB — COMPREHENSIVE METABOLIC PANEL
ALT: 28 U/L (ref 9–46)
AST (SGOT): 18 U/L (ref 10–35)
Albumin/Globulin Ratio: 1.6 (ref 1.0–2.5)
Albumin: 4.3 G/DL (ref 3.6–5.1)
Alkaline Phosphatase: 78 U/L (ref 40–115)
BUN: 12 MG/DL (ref 7–25)
Bilirubin, Total: 0.9 MG/DL (ref 0.2–1.2)
CO2: 25 mmol/L (ref 19–30)
Calcium: 9.4 MG/DL (ref 8.6–10.3)
Chloride: 106 mmol/L (ref 98–110)
Creatinine: 0.83 mg/dL (ref 0.70–1.33)
EGFR African American: 112 mL/min/{1.73_m2} (ref >=60)
EGFR: 96 mL/min/{1.73_m2} (ref >=60)
Globulin: 2.7 G/DL (ref 1.9–3.7)
Glucose: 88 MG/DL (ref 65–99)
Potassium: 4.2 mmol/L (ref 3.5–5.3)
Protein, Total: 7 G/DL (ref 6.1–8.1)
Sodium: 140 mmol/L (ref 135–146)

## 2013-06-25 LAB — LIPID PANEL
Cholesterol / HDL Ratio: 3.4 (ref 0.0–5.0)
Cholesterol: 96 MG/DL — ABNORMAL LOW (ref 125–200)
HDL: 28 mg/dL — AB (ref >=40)
LDL Calculated: 41 mg/dL (ref <130)
Non HDL Cholesterol (LDL and VLDL): 68 mg/dL
Triglycerides: 133 MG/DL (ref <150)

## 2013-06-25 LAB — HEMOGLOBIN A1C: Hemoglobin A1C: 6.1 % — ABNORMAL HIGH (ref <5.7)

## 2013-06-25 LAB — CK: Creatinine Kinase, Total: 82 U/L (ref 44–196)

## 2013-07-01 ENCOUNTER — Telehealth (INDEPENDENT_AMBULATORY_CARE_PROVIDER_SITE_OTHER): Payer: Self-pay

## 2013-07-01 NOTE — Telephone Encounter (Signed)
Per Dr. Everitt Amber, Labs look okay. Left message to call back.

## 2013-07-06 NOTE — Telephone Encounter (Signed)
Left message to call back  

## 2013-07-06 NOTE — Telephone Encounter (Signed)
Patient aware.

## 2013-07-19 ENCOUNTER — Other Ambulatory Visit (INDEPENDENT_AMBULATORY_CARE_PROVIDER_SITE_OTHER): Payer: Self-pay | Admitting: Hospitalist

## 2013-07-28 ENCOUNTER — Other Ambulatory Visit (INDEPENDENT_AMBULATORY_CARE_PROVIDER_SITE_OTHER): Payer: Self-pay | Admitting: Cardiovascular Disease

## 2013-07-28 DIAGNOSIS — I2510 Atherosclerotic heart disease of native coronary artery without angina pectoris: Secondary | ICD-10-CM

## 2013-07-28 DIAGNOSIS — I10 Essential (primary) hypertension: Secondary | ICD-10-CM

## 2013-07-28 DIAGNOSIS — Z955 Presence of coronary angioplasty implant and graft: Secondary | ICD-10-CM

## 2013-07-28 DIAGNOSIS — E785 Hyperlipidemia, unspecified: Secondary | ICD-10-CM

## 2013-07-28 MED ORDER — LISINOPRIL 10 MG PO TABS
10.00 mg | ORAL_TABLET | Freq: Every day | ORAL | Status: AC
Start: 2013-07-28 — End: ?

## 2013-08-01 ENCOUNTER — Other Ambulatory Visit (INDEPENDENT_AMBULATORY_CARE_PROVIDER_SITE_OTHER): Payer: Self-pay

## 2013-08-18 ENCOUNTER — Telehealth (INDEPENDENT_AMBULATORY_CARE_PROVIDER_SITE_OTHER): Payer: Self-pay

## 2013-08-18 NOTE — Telephone Encounter (Signed)
The patient called stating that he had a nose bleed last night. He went to the ER. ER doctors told him to follow up with Karl Ito, MD and cardiology. Spoke with the patient, need labs results and ER visit notes. Nose bleed most likely not from blood thinners. Mr. Mohler said that is was the urgent care and ER doctor stated. The patient will fax records from hospitalization.

## 2013-09-30 ENCOUNTER — Encounter (INDEPENDENT_AMBULATORY_CARE_PROVIDER_SITE_OTHER): Payer: Self-pay

## 2013-12-17 ENCOUNTER — Ambulatory Visit (INDEPENDENT_AMBULATORY_CARE_PROVIDER_SITE_OTHER): Payer: BLUE CROSS/BLUE SHIELD | Admitting: Cardiovascular Disease

## 2013-12-17 ENCOUNTER — Encounter (INDEPENDENT_AMBULATORY_CARE_PROVIDER_SITE_OTHER): Payer: Self-pay | Admitting: Cardiovascular Disease

## 2013-12-17 VITALS — BP 134/60 | HR 55 | Ht 71.0 in | Wt 259.0 lb

## 2013-12-17 DIAGNOSIS — R5383 Other fatigue: Secondary | ICD-10-CM

## 2013-12-17 DIAGNOSIS — IMO0001 Reserved for inherently not codable concepts without codable children: Secondary | ICD-10-CM

## 2013-12-17 DIAGNOSIS — M791 Myalgia, unspecified site: Secondary | ICD-10-CM

## 2013-12-17 DIAGNOSIS — I2510 Atherosclerotic heart disease of native coronary artery without angina pectoris: Secondary | ICD-10-CM

## 2013-12-17 DIAGNOSIS — I10 Essential (primary) hypertension: Secondary | ICD-10-CM

## 2013-12-17 DIAGNOSIS — Z9861 Coronary angioplasty status: Secondary | ICD-10-CM

## 2013-12-17 DIAGNOSIS — E119 Type 2 diabetes mellitus without complications: Secondary | ICD-10-CM

## 2013-12-17 DIAGNOSIS — Z79899 Other long term (current) drug therapy: Secondary | ICD-10-CM

## 2013-12-17 DIAGNOSIS — Z955 Presence of coronary angioplasty implant and graft: Secondary | ICD-10-CM

## 2013-12-17 DIAGNOSIS — R5381 Other malaise: Secondary | ICD-10-CM

## 2013-12-17 DIAGNOSIS — E785 Hyperlipidemia, unspecified: Secondary | ICD-10-CM

## 2013-12-17 MED ORDER — METOPROLOL SUCCINATE ER 100 MG PO TB24
100.0000 mg | ORAL_TABLET | Freq: Every day | ORAL | Status: AC
Start: 2013-12-17 — End: ?

## 2013-12-17 NOTE — Progress Notes (Signed)
[facility] - PROSPERITY OFFICE VISIT      Mr. Oliff presented today for cardiovascular follow up. He is a 58 y.o. 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 MI/ACS; hypertension; hyperlipidemia and Type 2 DM. s.   His PCP recently increased metformin from once a day to twice daily as hemoglobin A1c was up to 6.6.patient was not exercising during the cold winter months but is now back to exercising. 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 He starting back with that now. He is also following a low-fat low-carb diet again.  He has no chest pains, chest discomfort, shortness of breath, palpitations, lightheadedness, syncope or leg swelling. Overall he feels well  Current outpatient prescriptions:ACCU-CHEK FASTCLIX LANCETS Misc, , Disp: , Rfl: ;  ACCU-CHEK SMARTVIEW test strip, , Disp: , Rfl: ;  aspirin EC 81 MG EC tablet, Take 81 mg by mouth daily., Disp: , Rfl: ;  atorvastatin (LIPITOR) 40 MG tablet, Take 1 tablet (40 mg total) by mouth daily., Disp: 90 tablet, Rfl: 3;  lisinopril (PRINIVIL,ZESTRIL) 10 MG tablet, Take 1 tablet (10 mg total) by mouth daily., Disp: 90 tablet, Rfl: 3  metFORMIN (GLUCOPHAGE) 500 MG tablet, Take 500 mg by mouth 2 (two) times daily with meals., Disp: , Rfl: ;  metoprolol (TOPROL-XL) 100 MG 24 hr tablet, Take 1 tablet (100 mg total) by mouth daily., Disp: 90 tablet, Rfl: 3;  nystatin (MYCOSTATIN) powder, Apply topically as needed. , Disp: , Rfl: ;  prasugrel HCl (EFFIENT) 10 MG Tab, Take 1 tablet (10 mg total) by mouth daily., Disp: 90 tablet, Rfl: 3  [DISCONTINUED] metFORMIN (GLUCOPHAGE) 500 MG tablet, Take 1 tablet (500 mg total) by mouth every morning with breakfast., Disp: 30 tablet, Rfl: 0;  nitroglycerin (NITROSTAT) 0.4 MG SL tablet, Place 1 tablet (0.4 mg total) under the tongue every 5 (five) minutes as needed for Chest pain., Disp: 20 tablet, Rfl: 0    Review of Systems: All other systems were reviewed and were negative except as stated  above.    Physical Examination:   General Appearance:  A well-appearing male in no acute distress.   Vital Signs: BP 134/60  Pulse 55  Ht 1.803 m (5\' 11" )  Wt 117.482 kg (259 lb)  BMI 36.14 kg/m2 weight up 1 pound from last visit  HEENT: Normocephalic, atraumatic. EOMI,sclera anicteric, conjunctiva without pallor. Most mucus membranes, oropharynx clear, normal dentition  Neck:  Supple without jugular venous distention. Thyroid nonpalpable. Normal carotid upstroke without bruits.  Chest: Clear anteriorly and posteriorly without rales, rhonchi or wheezes  Cardiovascular: Regular rate and rhythm; normal S1 and S2 without gallop, murmur or rub. PMI of normal intensity and not displaced  Abdomen:   +BS, soft nontender without organomegaly, masses , abnormal pulsations or bruits.  Extremities: without clubbing, cyanosis or edema. Intact peripheral pulses without bruits  Skin: No rash, xanthoma or xanthelasma.   Neuro: Alert and oriented X3. Normal mood and affect. Cranial nerves grossly intact. Normal symmetric strength 4 extremities. Normal gait.    ECG: sinus bradycardia 55,chronic nonspecific inferior T-wave abnormality (T-wave inversions leads 3 and aVF) no change compared with prior EKG    Labs: December 02, 2013. Creatinine 0.79, BUN 13, Sodium 142, Potassium 4.2, AST 18 and ALT 22. Cholesterol 96, triglycerides 110, HDL 33, LDL 41. TFTs were normal. HgA1C 6.6.    Impression:  CROSS JORGE is a 58 y.o. male who presents for follow up.  1. Single  vessel CAD: s/p LAD DES 02/27/13.   2. S/P small non STEMI with normal systolic function.   3. Hypertension, controlled   4. Hyperlipidemia   5. Type 2 DM    Recommendations:  -encouraged patient to get back with regular exercise.  -Follow low-carb low-fat diet and work on weight loss  -Continue Lipitor 40 mg daily for hyperlipidemia, LDL at goal, <70  Continue coenzyme Q10 200 mg daily to prevent statin-induced myalgias   Continue aspirin 81 mg daily and Effient 10 mg  daily for CAD status post LAD DES. Cannot interrupt antiplatelet therapy for one year post stenting   Continue Toprol-XL 100 mg daily and lisinopril 10 mg daily for hypertension   Continue nitroglycerin 0.4 mg sublingual p.r.n. Chest pain, which the patient has not required   Continue metformin 500 mg twice daily for type 2 diabetes  Followup visit 6 months   Fasting labs before next visit    Joseph Pierini, MD, [facility], [facility]

## 2013-12-17 NOTE — Patient Instructions (Signed)
Doing well  Keep up the good work with healthy low carb low fat diet  Resume walking and exercise  Stay on same medications  Follow up visit in 6 months  Fasting labs before next visit

## 2013-12-24 LAB — LIPID PANEL
Cholesterol / HDL Ratio: 3.8 ratio units (ref 0.0–5.0)
Cholesterol: 102 mg/dL (ref 100–199)
HDL: 27 mg/dL — ABNORMAL LOW (ref >39)
LDL Calculated: 44 mg/dL (ref 0–99)
Triglycerides: 155 mg/dL — ABNORMAL HIGH (ref 0–149)
VLDL Calculated: 31 mg/dL (ref 5–40)

## 2013-12-24 LAB — COMPREHENSIVE METABOLIC PANEL
ALT: 24 IU/L (ref 0–44)
AST (SGOT): 21 IU/L (ref 0–40)
Albumin/Globulin Ratio: 1.7 (ref 1.1–2.5)
Albumin: 4.5 g/dL (ref 3.5–5.5)
Alkaline Phosphatase: 73 IU/L (ref 39–117)
BUN / Creatinine Ratio: 16 (ref 9–20)
BUN: 15 mg/dL (ref 6–24)
Bilirubin, Total: 0.9 mg/dL (ref 0.0–1.2)
CO2: 29 mmol/L (ref 18–29)
Calcium: 9.7 mg/dL (ref 8.7–10.2)
Chloride: 102 mmol/L (ref 97–108)
Creatinine: 0.94 mg/dL (ref 0.76–1.27)
EGFR: 102 mL/min/{1.73_m2} (ref >59)
EGFR: 88 mL/min/{1.73_m2} (ref >59)
Globulin, Total: 2.6 g/dL (ref 1.5–4.5)
Glucose: 100 mg/dL — ABNORMAL HIGH (ref 65–99)
Potassium: 4.8 mmol/L (ref 3.5–5.2)
Protein, Total: 7.1 g/dL (ref 6.0–8.5)
Sodium: 143 mmol/L (ref 134–144)

## 2013-12-24 LAB — CBC AND DIFFERENTIAL
Baso(Absolute): 0 10*3/uL (ref 0.0–0.2)
Basos: 0 %
Eos: 1 %
Eosinophils Absolute: 0.1 10*3/uL (ref 0.0–0.4)
Hematocrit: 42.2 % (ref 37.5–51.0)
Hemoglobin: 14.1 g/dL (ref 12.6–17.7)
Immature Granulocytes Absolute: 0 10*3/uL (ref 0.0–0.1)
Immature Granulocytes: 0 %
Lymphocytes Absolute: 2.7 10*3/uL (ref 0.7–3.1)
Lymphocytes: 40 %
MCH: 28.7 pg (ref 26.6–33.0)
MCHC: 33.4 g/dL (ref 31.5–35.7)
MCV: 86 fL (ref 79–97)
Monocytes Absolute: 0.6 10*3/uL (ref 0.1–0.9)
Monocytes: 9 %
Neutrophils Absolute: 3.4 10*3/uL (ref 1.4–7.0)
Neutrophils: 50 %
Platelets: 235 10*3/uL (ref 150–379)
RBC: 4.91 x10E6/uL (ref 4.14–5.80)
RDW: 14.6 % (ref 12.3–15.4)
WBC: 6.7 10*3/uL (ref 3.4–10.8)

## 2013-12-24 LAB — HEMOGLOBIN A1C: Hemoglobin A1C: 6.6 % — ABNORMAL HIGH (ref 4.8–5.6)

## 2013-12-24 LAB — CK: Creatinine Kinase, Total: 95 U/L (ref 24–204)

## 2013-12-24 LAB — TSH: TSH: 2.02 u[IU]/mL (ref 0.450–4.500)

## 2013-12-26 ENCOUNTER — Encounter (INDEPENDENT_AMBULATORY_CARE_PROVIDER_SITE_OTHER): Payer: Self-pay | Admitting: Cardiovascular Disease

## 2013-12-28 ENCOUNTER — Encounter (INDEPENDENT_AMBULATORY_CARE_PROVIDER_SITE_OTHER): Payer: Self-pay | Admitting: Cardiovascular Disease

## 2014-01-04 ENCOUNTER — Encounter (INDEPENDENT_AMBULATORY_CARE_PROVIDER_SITE_OTHER): Payer: Self-pay

## 2014-06-22 ENCOUNTER — Encounter (INDEPENDENT_AMBULATORY_CARE_PROVIDER_SITE_OTHER): Payer: Self-pay | Admitting: Cardiovascular Disease

## 2014-06-22 ENCOUNTER — Ambulatory Visit (INDEPENDENT_AMBULATORY_CARE_PROVIDER_SITE_OTHER): Payer: BLUE CROSS/BLUE SHIELD | Admitting: Cardiovascular Disease

## 2014-06-22 VITALS — BP 132/76 | HR 58 | Ht 71.0 in | Wt 264.0 lb

## 2014-06-22 DIAGNOSIS — I2510 Atherosclerotic heart disease of native coronary artery without angina pectoris: Secondary | ICD-10-CM

## 2014-06-22 DIAGNOSIS — I10 Essential (primary) hypertension: Secondary | ICD-10-CM

## 2014-06-22 DIAGNOSIS — E785 Hyperlipidemia, unspecified: Secondary | ICD-10-CM

## 2014-06-22 DIAGNOSIS — E119 Type 2 diabetes mellitus without complications: Secondary | ICD-10-CM

## 2014-06-22 DIAGNOSIS — Z955 Presence of coronary angioplasty implant and graft: Secondary | ICD-10-CM

## 2014-06-22 NOTE — Progress Notes (Signed)
[facility] - PROSPERITY OFFICE VISIT      Mr. Ramthun presented today for cardiovascular follow up. He is a 58 y.o. 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 MI/ACS; hypertension; hyperlipidemia and Type 2 DM. s.   He is walking more now, 2 miles every other day. No chest pains, SOB, palps, edema. He feels well. He is trying to lose weight  His only complaint is dry mouth.. No myalgias on Lipitor    Current Outpatient Prescriptions   Medication Sig Dispense Refill   . ACCU-CHEK FASTCLIX LANCETS Misc      . ACCU-CHEK SMARTVIEW test strip      . aspirin EC 81 MG EC tablet Take 81 mg by mouth daily.     [name] atorvastatin (LIPITOR) 40 MG tablet Take 1 tablet (40 mg total) by mouth daily. 90 tablet 3   . lisinopril (PRINIVIL,ZESTRIL) 10 MG tablet Take 1 tablet (10 mg total) by mouth daily. 90 tablet 3   . metFORMIN (GLUCOPHAGE) 500 MG tablet Take 500 mg by mouth 2 (two) times daily with meals.     . metoprolol (TOPROL-XL) 100 MG 24 hr tablet Take 1 tablet (100 mg total) by mouth daily. 90 tablet 3   . prasugrel HCl (EFFIENT) 10 MG Tab Take 1 tablet (10 mg total) by mouth daily. 90 tablet 3   . nitroglycerin (NITROSTAT) 0.4 MG SL tablet Place 1 tablet (0.4 mg total) under the tongue every 5 (five) minutes as needed for Chest pain. 20 tablet 0     No current facility-administered medications for this visit.     Review of Systems: All other systems were reviewed and were negative except as stated above.    Physical Examination:   General Appearance:  A well-appearing male in no acute distress.   Vital Signs: BP 132/76 mmHg  Pulse 58  Ht 1.803 m (5\' 11" )  Wt 119.75 kg (264 lb)  BMI 36.84 kg/m2  Recheck BP 126/66  HEENT: Normocephalic, atraumatic. EOMI,sclera anicteric, conjunctiva without pallor. Most mucus membranes, oropharynx clear, normal dentition  Neck:  Supple without jugular venous distention. Thyroid nonpalpable. Normal carotid upstroke without bruits.  Chest: Clear anteriorly and posteriorly  without rales, rhonchi or wheezes  Cardiovascular: Regular rate and rhythm; normal S1 and S2 without gallop, murmur or rub. PMI of normal intensity and not displaced  Abdomen:   +BS, soft nontender without organomegaly, masses , abnormal pulsations or bruits.  Extremities: without clubbing, cyanosis or edema. Intact peripheral pulses without bruits  Skin: No rash, xanthoma or xanthelasma.   Neuro: Alert and oriented X3. Normal mood and affect. Cranial nerves grossly intact. Normal symmetric strength 4 extremities. Normal gait.    ECG: sinus bradycardia 58,chronic nonspecific inferior T-wave abnormality (T-wave inversions leads 3 and aVF) no change compared with prior EKG    Labs: 05/18/14  Glucose 109, Uric Acid 5.5, BUN 11, Creatinine 0.77, GFR 99, Sodium 141, Potassium 4.4, Calcium 9.2, AST 16, ALT 26, Total Cholesterol 99, Triglycerides 130, HDL 32, LDL 41, TSH 2.160, HbA1c 6.5, WBC 4.88, Hemoglobin 13.9, Platelets 223,    Impression:  ARROW TOMKO is a 58 y.o. male who presents for follow up.  1. Single vessel CAD: s/p LAD DES 02/27/13.   2. S/P small non STEMI with normal systolic function.   3. Hypertension, controlled   4. Hyperlipidemia , LDL at goal  5. Type 2 DM    Recommendations:  -Keep up the good work with exercise.  -  Follow low-carb low-fat diet and work on weight loss  -Continue Lipitor 40 mg daily for hyperlipidemia, LDL at goal, <70  Continue coenzyme Q10 200 mg daily to prevent statin-induced myalgias   Continue aspirin 81 mg daily and Effient 10 mg daily for CAD status post LAD DES. Cannot interrupt antiplatelet therapy for one year post stenting   Continue Toprol-XL 100 mg daily and lisinopril 10 mg daily for hypertension   Continue nitroglycerin 0.4 mg sublingual p.r.n. Chest pain, which the patient has not required   Continue metformin 500 mg twice daily for type 2 diabetes  Followup visit 6 months   Fasting labs before next visit    Joseph Pierini, MD, [facility], [facility]

## 2014-06-22 NOTE — Patient Instructions (Signed)
Overall doing well  Labs look great  Keep up the good work  Get fasting labs with Dr. Chales Abrahams (please bring [place] a copy next visit)  Follow up visit in 6 months

## 2014-09-17 DIAGNOSIS — I2510 Atherosclerotic heart disease of native coronary artery without angina pectoris: Secondary | ICD-10-CM

## 2014-09-17 HISTORY — DX: Atherosclerotic heart disease of native coronary artery without angina pectoris: I25.10

## 2014-12-23 ENCOUNTER — Encounter (INDEPENDENT_AMBULATORY_CARE_PROVIDER_SITE_OTHER): Payer: BLUE CROSS/BLUE SHIELD | Admitting: Cardiovascular Disease

## 2015-06-17 HISTORY — PX: OTHER SURGICAL HISTORY: SHX169

## 2015-09-18 DIAGNOSIS — R634 Abnormal weight loss: Secondary | ICD-10-CM

## 2015-09-18 HISTORY — DX: Abnormal weight loss: R63.4

## 2016-01-18 DIAGNOSIS — E119 Type 2 diabetes mellitus without complications: Secondary | ICD-10-CM

## 2016-01-18 HISTORY — DX: Type 2 diabetes mellitus without complications: E11.9

## 2016-09-17 DIAGNOSIS — G231 Progressive supranuclear ophthalmoplegia [Steele-Richardson-Olszewski]: Secondary | ICD-10-CM | POA: Insufficient documentation

## 2016-09-17 DIAGNOSIS — R413 Other amnesia: Secondary | ICD-10-CM

## 2016-09-17 DIAGNOSIS — G20A1 Parkinson's disease without dyskinesia, without mention of fluctuations: Secondary | ICD-10-CM

## 2016-09-17 DIAGNOSIS — R4189 Other symptoms and signs involving cognitive functions and awareness: Secondary | ICD-10-CM | POA: Insufficient documentation

## 2016-09-17 DIAGNOSIS — R2681 Unsteadiness on feet: Secondary | ICD-10-CM

## 2016-09-17 DIAGNOSIS — G20 Parkinson's disease: Secondary | ICD-10-CM

## 2016-09-17 DIAGNOSIS — R1310 Dysphagia, unspecified: Secondary | ICD-10-CM

## 2016-09-17 HISTORY — DX: Unsteadiness on feet: R26.81

## 2016-09-17 HISTORY — DX: Dysphagia, unspecified: R13.10

## 2016-09-17 HISTORY — DX: Parkinson's disease without dyskinesia, without mention of fluctuations: G20.A1

## 2016-09-17 HISTORY — DX: Other amnesia: R41.3

## 2016-09-17 HISTORY — DX: Parkinson's disease: G20

## 2016-10-04 LAB — HM DIABETES EYE EXAM

## 2016-10-08 LAB — HM DIABETES EYE EXAM

## 2016-11-01 LAB — BASIC METABOLIC PANEL
BUN: 16 mg/dL (ref 4–21)
CREATININE: 0.9 mg/dL (ref 0.6–1.3)
Glucose: 95 mg/dL
POTASSIUM: 4.2 mmol/L (ref 3.4–5.3)
Sodium: 143 mmol/L (ref 137–147)

## 2016-11-01 LAB — HEMOGLOBIN A1C: HEMOGLOBIN A1C: 5.5

## 2016-11-01 LAB — LIPID PANEL
CHOLESTEROL: 88 mg/dL (ref 0–200)
HDL: 33 mg/dL — AB (ref 35–70)
LDL Cholesterol: 39 mg/dL
TRIGLYCERIDES: 81 mg/dL (ref 40–160)

## 2016-11-01 LAB — HEPATIC FUNCTION PANEL
ALT: 15 U/L (ref 10–40)
AST: 14 U/L (ref 14–40)
Alkaline Phosphatase: 74 U/L (ref 25–125)
Bilirubin, Total: 0.8 mg/dL

## 2017-01-21 ENCOUNTER — Encounter: Payer: Self-pay | Admitting: Internal Medicine

## 2017-01-21 ENCOUNTER — Non-Acute Institutional Stay: Payer: BLUE CROSS/BLUE SHIELD | Admitting: Internal Medicine

## 2017-01-21 DIAGNOSIS — G20 Parkinson's disease: Secondary | ICD-10-CM

## 2017-01-21 DIAGNOSIS — I10 Essential (primary) hypertension: Secondary | ICD-10-CM | POA: Diagnosis not present

## 2017-01-21 DIAGNOSIS — R413 Other amnesia: Secondary | ICD-10-CM

## 2017-01-21 DIAGNOSIS — E7800 Pure hypercholesterolemia, unspecified: Secondary | ICD-10-CM | POA: Diagnosis not present

## 2017-01-21 DIAGNOSIS — E119 Type 2 diabetes mellitus without complications: Secondary | ICD-10-CM

## 2017-01-21 DIAGNOSIS — R1312 Dysphagia, oropharyngeal phase: Secondary | ICD-10-CM | POA: Diagnosis not present

## 2017-01-21 DIAGNOSIS — I214 Non-ST elevation (NSTEMI) myocardial infarction: Secondary | ICD-10-CM | POA: Insufficient documentation

## 2017-01-21 DIAGNOSIS — R2681 Unsteadiness on feet: Secondary | ICD-10-CM

## 2017-01-21 DIAGNOSIS — I2510 Atherosclerotic heart disease of native coronary artery without angina pectoris: Secondary | ICD-10-CM

## 2017-01-21 NOTE — Progress Notes (Signed)
Provider:   Location:  [facility] Room Number: [phone] Place of Service:  ALF [phone])  PCP: Kimber Relic, MD Patient Care Team: Kimber Relic, MD as PCP - General (Internal Medicine) [name], [name], NP as Nurse Practitioner (Internal Medicine)  Extended Emergency Contact Information Primary Emergency Contact: Platz,Frank Address: [address]          [place], [place] [address] Darden Amber of [place] Home Phone: [phone] Mobile Phone: [phone] Relation: Brother  Code Status: Full Code Goals of Care: Advanced Directive information Advanced Directives 01/21/2017  Does Patient Have a Medical Advance Directive? No      Chief Complaint  Patient presents with  . New Admit to AL    admitted from home 01/15/2017    HPI: Patient is a 61 y.o. male seen today for admission to [facility] on 01/15/17.  He moved due to Parkinsonism with falls and memory changes. He had to quit driving in the last few months.  He has not had neuro evaluation for his Parkinsonism.He is having some difficulty with dysphagia now, as well as falls.  He had stent placement in the LAD in 2016 for CAD. He intentionally lost 50# following that.  He has DM 2, HTN, and HLD, but these are well controlled.   Past Medical History:  Diagnosis Date  . Adult onset fluency disorder   . Coronary artery disease 2016   s/p stent left anterior descending artery   . Diabetes mellitus type 2, controlled (HCC) 01/18/2016  . Dysphagia 2018  . Gait instability 2018  . History of falling   . Hypercholesterolemia   . Hypertension   . Memory change 2018  . Parkinson's disease (HCC) 2018  . Weight loss 2017   intentional   Past Surgical History:  Procedure Laterality Date  . CAROTID STENT  2012   in left anterior descending artery past EF 55%  . nuclear stress test  06/17/2015   Gated SPECT images reveal normal LV systolic function. EF 55%. Comparing the rest & stress SPECT images, no ischemia.  Gated images are normal. Dr. Vernell Barrier, MD [place], [place]     reports that he has never smoked. He has never used smokeless tobacco. He reports that he does not drink alcohol or use drugs. Social History   Social History  . Marital status: Single    Spouse name: N/A  . Number of children: N/A  . Years of education: N/A   Occupational History  .  Engineer, maintenance    Social History Main Topics  . Smoking status: Never Smoker  . Smokeless tobacco: Never Used  . Alcohol use No  . Drug use: No  . Sexual activity: No   Other Topics Concern  . Not on file   Social History Narrative   Moved [place] [facility] 01/[place]   Single   Never smoked   Alcohol none    [employer] with walker     Family History  Problem Relation Age of Onset  . Cancer Mother   . Heart disease Mother     Health Maintenance  Topic Date Due  . Hepatitis C Screening  1956-03-29  . HIV Screening  06/03/1969  . TETANUS/TDAP  06/03/1973  . COLONOSCOPY  06/03/2004  . INFLUENZA VACCINE  04/17/2017    Allergies  Allergen Reactions  . Codeine     Outpatient Encounter Prescriptions as of 01/21/2017  Medication Sig  . aspirin 81  MG chewable tablet Chew by mouth daily.  [name] atorvastatin (LIPITOR) 40 MG tablet Take 40 mg by mouth. Take one tablet at bedtime for cholesterol  . lisinopril (PRINIVIL,ZESTRIL) 10 MG tablet Take 10 mg by mouth. Take one tablet daily for blood pressure  . metoprolol succinate (TOPROL-XL) 100 MG 24 hr tablet Take 100 mg by mouth. Take one tablet daily for blood pressure  . NON FORMULARY Hemp oil -4 sprays sublingual once a day  . rOPINIRole (REQUIP) 0.25 MG tablet Take 0.25 mg by mouth. Take one tablet once a day   No facility-administered encounter medications on file as of 01/21/2017.     Review of Systems  Constitutional: Negative for activity change, appetite change, fatigue, fever and unexpected weight change.       Intentional weight loss of 50# since  2016. Lost weight due to CAD and stent that year.  HENT: Negative for congestion, ear pain, hearing loss, rhinorrhea, sore throat, tinnitus, trouble swallowing and voice change.   Eyes:       Corrective lenses  Respiratory: Negative for cough, choking, chest tightness, shortness of breath and wheezing.   Cardiovascular: Negative for chest pain, palpitations and leg swelling.       CAD with stent in 2016.  Gastrointestinal: Negative for abdominal distention, abdominal pain, constipation, diarrhea and nausea.       Difficulty swallowing and coughing at meals as well asw other times.  Endocrine: Negative for cold intolerance, heat intolerance, polydipsia, polyphagia and polyuria.       DM2  Genitourinary: Negative for dysuria, frequency, testicular pain and urgency.       Not incontinent  Musculoskeletal: Positive for gait problem (falls , many times to the right. has 2 wheel walker with rear skids.). Negative for arthralgias, back pain, myalgias and neck pain.  Skin: Negative for color change, pallor and rash.  Allergic/Immunologic: Negative.   Neurological: Positive for weakness. Negative for dizziness, tremors, syncope, speech difficulty, numbness and headaches.       Memory changes with loss since 2016. Diagnosed with Parkinsonism in March 2018.   Hematological: Negative for adenopathy. Does not bruise/bleed easily.  Psychiatric/Behavioral: Negative for behavioral problems, confusion, decreased concentration, hallucinations and sleep disturbance. The patient is not nervous/anxious.     Vitals:   01/21/17 1640  BP: 132/70  Pulse: 74  Resp: 18  Temp: (!) 96.9 F (36.1 C)  SpO2: 95%  Weight: 225 lb 3.2 oz (102.2 kg)  Height: 5\' 10"  (1.778 m)   Body mass index is 32.31 kg/m. Physical Exam  Constitutional: He is oriented to person, place, and time. He appears well-developed and well-nourished. No distress.  HENT:  Right Ear: External ear normal.  Left Ear: External ear normal.    Nose: Nose normal.  Mouth/Throat: Oropharynx is clear and moist. No oropharyngeal exudate.  Eyes: Conjunctivae and EOM are normal. Pupils are equal, round, and reactive to light.  Neck: No JVD present. No tracheal deviation present. No thyromegaly present.  Cardiovascular: Normal rate, regular rhythm, normal heart sounds and intact distal pulses.  Exam reveals no gallop and no friction rub.   No murmur heard. Pulmonary/Chest: No respiratory distress. He has no wheezes. He has no rales. He exhibits no tenderness.  Abdominal: He exhibits no distension and no mass. There is no tenderness.  Musculoskeletal: Normal range of motion. He exhibits no edema or tenderness.  Lymphadenopathy:    He has no cervical adenopathy.  Neurological: He is alert and oriented to person, place, and  time. He has normal reflexes. No cranial nerve deficit. Coordination normal.  No significant tremor.  Paucity of movements. Masked face. Festinating gait.  Skin: No rash noted. No erythema. No pallor.  Psychiatric: He has a normal mood and affect. His behavior is normal. Judgment and thought content normal.    Labs reviewed: Basic Metabolic Panel:  Recent Labs  16/06/9601/15/18  NA 143  K 4.2  BUN 16  CREATININE 0.9   Liver Function Tests:  Recent Labs  11/01/16  AST 14  ALT 15  ALKPHOS 74   No results for input(s): LIPASE, AMYLASE in the last 8760 hours. No results for input(s): AMMONIA in the last 8760 hours. CBC: No results for input(s): WBC, NEUTROABS, HGB, HCT, MCV, PLT in the last 8760 hours. Cardiac Enzymes: No results for input(s): CKTOTAL, CKMB, CKMBINDEX, TROPONINI in the last 8760 hours. BNP: Invalid input(s): POCBNP Lab Results  Component Value Date   HGBA1C 5.5 11/01/2016   .  Assessment/Plan 1. Parkinson's disease (HCC) Has many features of this disorder. Currently taking Requip.  2. Memory change Needs MMSE or similar test.  3. Essential hypertension Controlled on present  meds.  4. Hypercholesterolemia The current medical regimen is effective;  continue present plan and medications.  5. Gait instability Use walker at all times.  6. Oropharyngeal dysphagia May need MBSS  7. Controlled type 2 diabetes mellitus without complication, without long-term current use of insulin (HCC) controlled  8. Coronary artery disease involving native coronary artery of native heart without angina pectoris stable

## 2017-02-14 ENCOUNTER — Encounter: Payer: Self-pay | Admitting: Nurse Practitioner

## 2017-02-14 ENCOUNTER — Non-Acute Institutional Stay: Payer: BLUE CROSS/BLUE SHIELD | Admitting: Nurse Practitioner

## 2017-02-14 DIAGNOSIS — I2510 Atherosclerotic heart disease of native coronary artery without angina pectoris: Secondary | ICD-10-CM | POA: Diagnosis not present

## 2017-02-14 DIAGNOSIS — I10 Essential (primary) hypertension: Secondary | ICD-10-CM | POA: Diagnosis not present

## 2017-02-14 DIAGNOSIS — E119 Type 2 diabetes mellitus without complications: Secondary | ICD-10-CM

## 2017-02-14 DIAGNOSIS — R2681 Unsteadiness on feet: Secondary | ICD-10-CM | POA: Diagnosis not present

## 2017-02-14 DIAGNOSIS — G20 Parkinson's disease: Secondary | ICD-10-CM

## 2017-02-14 DIAGNOSIS — R413 Other amnesia: Secondary | ICD-10-CM | POA: Diagnosis not present

## 2017-02-14 NOTE — Assessment & Plan Note (Signed)
Short term memory lapses.

## 2017-02-14 NOTE — Assessment & Plan Note (Signed)
s/p stent left anterior descending artery  No angina since last seen by Dr. Chilton SiGreen

## 2017-02-14 NOTE — Progress Notes (Signed)
Location:      Place of Service:  Clinic (12) Provider: Chipper Oman NP  Code Status: DNR Goals of Care:  Advanced Directives 02/14/2017  Does Patient Have a Medical Advance Directive? Yes  Type of Estate agent of [place];Out of facility DNR (pink MOST or yellow form)  Does patient want to make changes to medical advance directive? No - Patient declined  Copy of Healthcare Power of Attorney in Chart? Yes  Pre-existing out of facility DNR order (yellow form or pink MOST form) Yellow form placed in chart (order not valid for inpatient use)     Chief Complaint  Patient presents with  . Medical Management of Chronic Issues    referral to [facility] for a brain scan,(parkinson diseas e    HPI: Patient is a 61 y.o. male seen today for an acute visit for Hx of Parkinson disease since 11/2016, taking Requip 0.25mg , not sure of the efficacy, falling >10x in the past 2 years. He desires to see an Neurologist in [place]. HTN controlled on Metoprolol 100mg  qd, Lisinopril 10mg .   Past Medical History:  Diagnosis Date  . Adult onset fluency disorder   . Coronary artery disease 2016   s/p stent left anterior descending artery   . Diabetes mellitus type 2, controlled (HCC) 01/18/2016  . Dysphagia 2018  . Gait instability 2018  . History of falling   . Hypercholesterolemia   . Hypertension   . Memory change 2018  . Parkinson's disease (HCC) 2018  . Weight loss 2017   intentional    Past Surgical History:  Procedure Laterality Date  . CAROTID STENT  2012   in left anterior descending artery past EF 55%  . nuclear stress test  06/17/2015   Gated SPECT images reveal normal LV systolic function. EF 55%. Comparing the rest & stress SPECT images, no ischemia. Gated images are normal. Dr. Vernell Barrier, MD [facility] [place], [place]     Allergies  Allergen Reactions  . Codeine     Allergies as of 02/14/2017      Reactions   Codeine       Medication List       Accurate  as of 02/14/17  3:33 PM. Always use your most recent med list.          aspirin 81 MG chewable tablet Chew by mouth daily.   atorvastatin 40 MG tablet Commonly known as:  LIPITOR Take 40 mg by mouth. Take one tablet at bedtime for cholesterol   lisinopril 10 MG tablet Commonly known as:  PRINIVIL,ZESTRIL Take 10 mg by mouth. Take one tablet daily for blood pressure   metoprolol succinate 100 MG 24 hr tablet Commonly known as:  TOPROL-XL Take 100 mg by mouth. Take one tablet daily for blood pressure   NON FORMULARY Hemp oil -4 sprays sublingual once a day   rOPINIRole 0.25 MG tablet Commonly known as:  REQUIP Take 0.25 mg by mouth. Take one tablet once a day       Review of Systems:  Review of Systems  Constitutional: Negative for activity change, appetite change, fatigue, fever and unexpected weight change.       Intentional weight loss of 50# since 2016. Lost weight due to CAD and stent that year.  HENT: Negative for congestion, ear pain, hearing loss, rhinorrhea, sore throat, tinnitus, trouble swallowing and voice change.        Slow speech  Eyes:       Corrective lenses  Respiratory: Negative for cough, choking, chest tightness, shortness of breath and wheezing.   Cardiovascular: Negative for chest pain, palpitations and leg swelling.       CAD with stent in 2016.  Gastrointestinal: Negative for abdominal distention, abdominal pain, constipation, diarrhea and nausea.       Difficulty swallowing and coughing at meals as well asw other times.  Endocrine: Negative for cold intolerance, heat intolerance, polydipsia, polyphagia and polyuria.       DM2  Genitourinary: Negative for dysuria, frequency, testicular pain and urgency.       Not incontinent  Musculoskeletal: Positive for gait problem (falls , many times to the right. has 2 wheel walker with rear skids.). Negative for arthralgias, back pain, myalgias and neck pain.       Falling >10x/2.5 years Ambulates with  walker.   Skin: Negative for color change, pallor and rash.  Allergic/Immunologic: Negative.   Neurological: Positive for weakness. Negative for dizziness, tremors, syncope, speech difficulty, numbness and headaches.       Memory changes with loss since 2016. Diagnosed with Parkinsonism in March 2018.   Hematological: Negative for adenopathy. Does not bruise/bleed easily.  Psychiatric/Behavioral: Negative for behavioral problems, confusion, decreased concentration, hallucinations and sleep disturbance. The patient is not nervous/anxious.     Health Maintenance  Topic Date Due  . Hepatitis C Screening  07/22/1956  . PNEUMOCOCCAL POLYSACCHARIDE VACCINE (1) 06/03/1956  . HIV Screening  06/03/1969  . COLONOSCOPY  06/03/2004  . INFLUENZA VACCINE  04/17/2017  . HEMOGLOBIN A1C  05/01/2017  . OPHTHALMOLOGY EXAM  10/08/2017  . FOOT EXAM  01/21/2018  . TETANUS/TDAP  09/17/2026    Physical Exam: Vitals:   02/14/17 1447  BP: 110/70  Pulse: (!) 58  Resp: 18  Temp: 98.2 F (36.8 C)  SpO2: 97%  Weight: 225 lb (102.1 kg)  Height: 5\' 10"  (1.778 m)   Body mass index is 32.28 kg/m. Physical Exam  Constitutional: He is oriented to person, place, and time. He appears well-developed and well-nourished. No distress.  HENT:  Right Ear: External ear normal.  Left Ear: External ear normal.  Nose: Nose normal.  Mouth/Throat: Oropharynx is clear and moist. No oropharyngeal exudate.  Eyes: Conjunctivae and EOM are normal. Pupils are equal, round, and reactive to light.  Neck: No JVD present. No tracheal deviation present. No thyromegaly present.  Cardiovascular: Normal rate, regular rhythm, normal heart sounds and intact distal pulses.  Exam reveals no gallop and no friction rub.   No murmur heard. Pulmonary/Chest: No respiratory distress. He has no wheezes. He has no rales. He exhibits no tenderness.  Abdominal: He exhibits no distension and no mass. There is no tenderness.  Musculoskeletal:  Normal range of motion. He exhibits no edema or tenderness.  Slow walking with a walker.   Lymphadenopathy:    He has no cervical adenopathy.  Neurological: He is alert and oriented to person, place, and time. He has normal reflexes. No cranial nerve deficit. Coordination normal.  No significant tremor.  Paucity of movements. Masked face. Festinating gait.  Skin: No rash noted. No erythema. No pallor.  Psychiatric: He has a normal mood and affect. His behavior is normal. Judgment and thought content normal.    Labs reviewed: Basic Metabolic Panel:  Recent Labs  40/98/11  NA 143  K 4.2  BUN 16  CREATININE 0.9   Liver Function Tests:  Recent Labs  11/01/16  AST 14  ALT 15  ALKPHOS 74   No results for  input(s): LIPASE, AMYLASE in the last 8760 hours. No results for input(s): AMMONIA in the last 8760 hours. CBC: No results for input(s): WBC, NEUTROABS, HGB, HCT, MCV, PLT in the last 8760 hours. Lipid Panel:  Recent Labs  11/01/16  CHOL 88  HDL 33*  LDLCALC 39  TRIG 81   Lab Results  Component Value Date   HGBA1C 5.5 11/01/2016    Procedures since last visit: No results found.  Assessment/Plan Hypertension Controlled, continue Metoprolol 100mg , Lisinopril 10mg , 11/01/16 Na 143, K 4.2, Bun 16, creat 0.9  Coronary artery disease s/p stent left anterior descending artery  No angina since last seen by Dr. Chilton SiGreen  Diabetes mellitus type 2, controlled (HCC) Diet controlled, last Hgb a1c 5.5 11/01/16  Parkinson's disease (HCC) Falling >10x in the past 2 years and a half, slow walking with walker, slow speech, taking Requip 0.25mg  qd, not sure of the efficacy, desires for Neurology consultation.   Memory change Short term memory lapses.   Gait instability Risk for falling, continue ambulating with walker.     Labs/tests ordered: none, referral to neurology  Next appt:  prn

## 2017-02-14 NOTE — Assessment & Plan Note (Signed)
Controlled, continue Metoprolol 100mg , Lisinopril 10mg , 11/01/16 Na 143, K 4.2, Bun 16, creat 0.9

## 2017-02-14 NOTE — Assessment & Plan Note (Signed)
Risk for falling, continue ambulating with walker.

## 2017-02-14 NOTE — Assessment & Plan Note (Signed)
Diet controlled, last Hgb a1c 5.5 11/01/16

## 2017-02-14 NOTE — Assessment & Plan Note (Signed)
Falling >10x in the past 2 years and a half, slow walking with walker, slow speech, taking Requip 0.25mg  qd, not sure of the efficacy, desires for Neurology consultation.

## 2017-03-19 ENCOUNTER — Encounter: Payer: Self-pay | Admitting: Internal Medicine

## 2017-03-19 ENCOUNTER — Non-Acute Institutional Stay: Payer: BLUE CROSS/BLUE SHIELD | Admitting: Nurse Practitioner

## 2017-03-19 DIAGNOSIS — I10 Essential (primary) hypertension: Secondary | ICD-10-CM

## 2017-03-19 DIAGNOSIS — R413 Other amnesia: Secondary | ICD-10-CM

## 2017-03-19 DIAGNOSIS — G4700 Insomnia, unspecified: Secondary | ICD-10-CM | POA: Diagnosis not present

## 2017-03-19 DIAGNOSIS — G20 Parkinson's disease: Secondary | ICD-10-CM | POA: Diagnosis not present

## 2017-03-19 NOTE — Assessment & Plan Note (Signed)
Short term memory lapses.

## 2017-03-19 NOTE — Assessment & Plan Note (Signed)
Falling >10x in the past 2 years and a half, slow walking with walker, slow speech, taking Requip 0.25mg  qd, not sure of the efficacy, desires for Neurology consultation.  May contributory to insomnia.

## 2017-03-19 NOTE — Progress Notes (Signed)
This encounter was created in error - please disregard.

## 2017-03-19 NOTE — Assessment & Plan Note (Signed)
May be situational, will continue Melatonin, adding Trazodone 25mg  hs prn, observe.

## 2017-03-19 NOTE — Assessment & Plan Note (Signed)
Controlled, continue Metoprolol 100mg , Lisinopril 10mg , 11/01/16 Na 143, K 4.2, Bun 16, creat 0.9 Observe the patient.

## 2017-03-19 NOTE — Progress Notes (Signed)
Location:   [facility] Room Number: 915 Place of Service:  ALF (13) Provider: Chipper Oman NP  Code Status: DNR Goals of Care:  Advanced Directives 02/14/2017  Does Patient Have a Medical Advance Directive? Yes  Type of Estate agent of [place];Out of facility DNR (pink MOST or yellow form)  Does patient want to make changes to medical advance directive? No - Patient declined  Copy of Healthcare Power of Attorney in Chart? Yes  Pre-existing out of facility DNR order (yellow form or pink MOST form) Yellow form placed in chart (order not valid for inpatient use)     Chief Complaint  Patient presents with  . Acute Visit    difficulty stay a sleep sometimes    HPI: Patient is a 61 y.o. male seen today for an acute visit for Hx of difficulty staying asleep a few times in the past a month or so, he stated he worries his finance sometimes, he takes Melatonin nightly, he denied pain, SOB, bathroom trips 1-2x/night.    Hx of Parkinson disease since 11/2016, taking Requip 0.25mg , not sure of the efficacy, falling >10x in the past 2 years. Pending Neurologist consultation in [place]. HTN controlled on Metoprolol 100mg  qd, Lisinopril 10mg .   Past Medical History:  Diagnosis Date  . Adult onset fluency disorder   . Coronary artery disease 2016   s/p stent left anterior descending artery   . Diabetes mellitus type 2, controlled (HCC) 01/18/2016  . Dysphagia 2018  . Gait instability 2018  . History of falling   . Hypercholesterolemia   . Hypertension   . Memory change 2018  . Parkinson's disease (HCC) 2018  . Weight loss 2017   intentional    Past Surgical History:  Procedure Laterality Date  . CAROTID STENT  2012   in left anterior descending artery past EF 55%  . nuclear stress test  06/17/2015   Gated SPECT images reveal normal LV systolic function. EF 55%. Comparing the rest & stress SPECT images, no ischemia. Gated images are normal.  Dr. Vernell Barrier, MD [facility] [place], [place]     Allergies  Allergen Reactions  . Codeine     Allergies as of 03/19/2017      Reactions   Codeine       Medication List       Accurate as of 03/19/17 11:59 PM. Always use your most recent med list.          aspirin 81 MG chewable tablet Chew by mouth daily.   atorvastatin 40 MG tablet Commonly known as:  LIPITOR Take 40 mg by mouth. Take one tablet at bedtime for cholesterol   lisinopril 10 MG tablet Commonly known as:  PRINIVIL,ZESTRIL Take 10 mg by mouth. Take one tablet daily for blood pressure   metoprolol succinate 100 MG 24 hr tablet Commonly known as:  TOPROL-XL Take 100 mg by mouth. Take one tablet daily for blood pressure   NON FORMULARY Hemp oil -4 sprays sublingual once a day   rOPINIRole 0.25 MG tablet Commonly known as:  REQUIP Take 0.25 mg by mouth. Take one tablet once a day       Review of Systems:  Review of Systems  Constitutional: Negative for activity change, appetite change, fatigue, fever and unexpected weight change.       Intentional weight loss of 50# since 2016. Lost weight due to CAD and stent that year.  HENT: Negative for congestion, ear pain, hearing  loss and rhinorrhea.        Slow speech  Eyes:       Corrective lenses  Respiratory: Negative for cough, choking, chest tightness, shortness of breath and wheezing.   Cardiovascular: Negative for chest pain, palpitations and leg swelling.       CAD with stent in 2016.  Gastrointestinal: Negative for abdominal distention, abdominal pain and constipation.       Difficulty swallowing and coughing at meals as well asw other times.  Endocrine: Negative for cold intolerance, heat intolerance, polydipsia, polyphagia and polyuria.       DM2  Genitourinary: Negative for dysuria, frequency and urgency.       Not incontinent  Musculoskeletal: Positive for gait problem (falls , many times to the right. has 2 wheel walker with rear skids.). Negative for  arthralgias, back pain, myalgias and neck pain.       Falling >10x/2.5 years Ambulates with walker.   Skin: Negative for color change, pallor and rash.  Allergic/Immunologic: Negative.   Neurological: Positive for weakness. Negative for dizziness, tremors, syncope, speech difficulty, numbness and headaches.       Memory changes with loss since 2016. Diagnosed with Parkinsonism in March 2018.   Psychiatric/Behavioral: Positive for sleep disturbance. Negative for behavioral problems, confusion, decreased concentration and hallucinations. The patient is not nervous/anxious.     Health Maintenance  Topic Date Due  . Hepatitis C Screening  30-Jul-1956  . PNEUMOCOCCAL POLYSACCHARIDE VACCINE (1) 06/03/1956  . HIV Screening  06/03/1969  . COLONOSCOPY  06/03/2004  . INFLUENZA VACCINE  04/17/2017  . HEMOGLOBIN A1C  05/01/2017  . OPHTHALMOLOGY EXAM  10/08/2017  . FOOT EXAM  01/21/2018  . TETANUS/TDAP  09/17/2026    Physical Exam: There were no vitals filed for this visit. There is no height or weight on file to calculate BMI. Physical Exam  Constitutional: He is oriented to person, place, and time. He appears well-developed and well-nourished. No distress.  HENT:  Right Ear: External ear normal.  Left Ear: External ear normal.  Nose: Nose normal.  Mouth/Throat: Oropharynx is clear and moist. No oropharyngeal exudate.  Eyes: Conjunctivae and EOM are normal. Pupils are equal, round, and reactive to light.  Neck: No JVD present. No tracheal deviation present. No thyromegaly present.  Cardiovascular: Normal rate, regular rhythm, normal heart sounds and intact distal pulses.  Exam reveals no gallop and no friction rub.   No murmur heard. Pulmonary/Chest: No respiratory distress. He has no wheezes. He has no rales. He exhibits no tenderness.  Abdominal: He exhibits no distension and no mass. There is no tenderness.  Musculoskeletal: Normal range of motion. He exhibits no edema or tenderness.    Slow walking with a walker.   Lymphadenopathy:    He has no cervical adenopathy.  Neurological: He is alert and oriented to person, place, and time. He has normal reflexes. No cranial nerve deficit. Coordination normal.  No significant tremor.  Paucity of movements. Masked face. Festinating gait.  Skin: No rash noted. No erythema. No pallor.  Psychiatric: He has a normal mood and affect. His behavior is normal. Judgment and thought content normal.    Labs reviewed: Basic Metabolic Panel:  Recent Labs  16/06/9601/15/18  NA 143  K 4.2  BUN 16  CREATININE 0.9   Liver Function Tests:  Recent Labs  11/01/16  AST 14  ALT 15  ALKPHOS 74   No results for input(s): LIPASE, AMYLASE in the last 8760 hours. No results for input(s):  AMMONIA in the last 8760 hours. CBC: No results for input(s): WBC, NEUTROABS, HGB, HCT, MCV, PLT in the last 8760 hours. Lipid Panel:  Recent Labs  11/01/16  CHOL 88  HDL 33*  LDLCALC 39  TRIG 81   Lab Results  Component Value Date   HGBA1C 5.5 11/01/2016    Procedures since last visit: No results found.  Assessment/Plan Insomnia May be situational, will continue Melatonin, adding Trazodone 25mg  hs prn, observe.   Parkinson's disease (HCC) Falling >10x in the past 2 years and a half, slow walking with walker, slow speech, taking Requip 0.25mg  qd, not sure of the efficacy, desires for Neurology consultation.  May contributory to insomnia.   Memory change Short term memory lapses.   Hypertension Controlled, continue Metoprolol 100mg , Lisinopril 10mg , 11/01/16 Na 143, K 4.2, Bun 16, creat 0.9 Observe the patient.     Labs/tests ordered: none

## 2017-05-10 ENCOUNTER — Non-Acute Institutional Stay: Payer: BLUE CROSS/BLUE SHIELD | Admitting: Internal Medicine

## 2017-05-10 ENCOUNTER — Encounter: Payer: Self-pay | Admitting: Internal Medicine

## 2017-05-10 DIAGNOSIS — E7800 Pure hypercholesterolemia, unspecified: Secondary | ICD-10-CM | POA: Diagnosis not present

## 2017-05-10 DIAGNOSIS — E119 Type 2 diabetes mellitus without complications: Secondary | ICD-10-CM

## 2017-05-10 DIAGNOSIS — K5901 Slow transit constipation: Secondary | ICD-10-CM

## 2017-05-10 DIAGNOSIS — R2681 Unsteadiness on feet: Secondary | ICD-10-CM

## 2017-05-10 DIAGNOSIS — I2510 Atherosclerotic heart disease of native coronary artery without angina pectoris: Secondary | ICD-10-CM

## 2017-05-10 DIAGNOSIS — E669 Obesity, unspecified: Secondary | ICD-10-CM

## 2017-05-10 DIAGNOSIS — G20 Parkinson's disease: Secondary | ICD-10-CM | POA: Diagnosis not present

## 2017-05-10 DIAGNOSIS — I10 Essential (primary) hypertension: Secondary | ICD-10-CM

## 2017-05-10 DIAGNOSIS — W19XXXA Unspecified fall, initial encounter: Secondary | ICD-10-CM | POA: Diagnosis not present

## 2017-05-10 DIAGNOSIS — G4700 Insomnia, unspecified: Secondary | ICD-10-CM | POA: Diagnosis not present

## 2017-05-10 NOTE — Progress Notes (Signed)
Location:  [facility] Room Number: 915 Place of Service:  ALF [phone]) Provider:  Oneal Grout MD  Oneal Grout, MD  Patient Care Team: Oneal Grout, MD as PCP - General (Internal Medicine) [name], [name], NP as Nurse Practitioner (Internal Medicine)  Extended Emergency Contact Information Primary Emergency Contact: Barz,Frank Address: [address]          [place], [place] [address] D[place] of [place] Home Phone: [phone] Mobile Phone: [phone] Relation: Brother  Code Status:  DNR Goals of care: Advanced Directive information Advanced Directives 02/14/2017  Does Patient Have a Medical Advance Directive? Yes  Type of Estate agent of [place];Out of facility DNR (pink MOST or yellow form)  Does patient want to make changes to medical advance directive? No - Patient declined  Copy of Healthcare Power of Attorney in Chart? Yes  Pre-existing out of facility DNR order (yellow form or pink MOST form) Yellow form placed in chart (order not valid for inpatient use)     Chief Complaint  Patient presents with  . Medical Management of Chronic Issues    Routine Visit     HPI:  Pt is a 61 y.o. male seen today for medical management of chronic diseases.  He is seen in his room. He denies any concern today. He had a fall yesterday in Gym area while trying to get up unassisted and lost balance. He fell on his buttock. Denies any pain or injury. He has parkinson's disease. He remains chest pain free and has history of CAD s/p stent placement. His blood pressure is stable on review. No concern from nursing.    Past Medical History:  Diagnosis Date  . Adult onset fluency disorder   . Coronary artery disease 2016   s/p stent left anterior descending artery   . Diabetes mellitus type 2, controlled (HCC) 01/18/2016  . Dysphagia 2018  . Gait instability 2018  . History of falling   . Hypercholesterolemia   . Hypertension   . Memory  change 2018  . Parkinson's disease (HCC) 2018  . Weight loss 2017   intentional   Past Surgical History:  Procedure Laterality Date  . CAROTID STENT  2012   in left anterior descending artery past EF 55%  . nuclear stress test  06/17/2015   Gated SPECT images reveal normal LV systolic function. EF 55%. Comparing the rest & stress SPECT images, no ischemia. Gated images are normal. Dr. Vernell Barrier, MD [place], [place]     Allergies  Allergen Reactions  . Codeine     Outpatient Encounter Prescriptions as of 05/10/2017  Medication Sig  . aspirin 81 MG chewable tablet Chew 81 mg by mouth daily.   [name] atorvastatin (LIPITOR) 40 MG tablet Take 40 mg by mouth at bedtime. \  . lisinopril (PRINIVIL,ZESTRIL) 10 MG tablet Take 10 mg by mouth. Take one tablet daily for blood pressure  . metoprolol succinate (TOPROL-XL) 100 MG 24 hr tablet Take 100 mg by mouth daily.   . NON FORMULARY Hemp oil -4 sprays sublingual once a day  . rOPINIRole (REQUIP) 0.25 MG tablet Take 0.25 mg by mouth daily.   . traZODone (DESYREL) 25 mg TABS tablet Take 25 mg by mouth at bedtime as needed for sleep.   No facility-administered encounter medications on file as of 05/10/2017.     Review of Systems  Constitutional: Negative for appetite change, chills, diaphoresis, fatigue and fever.  HENT: Negative for congestion, ear pain, hearing loss,  mouth sores, nosebleeds, rhinorrhea, sinus pain, sore throat and trouble swallowing.   Eyes: Negative for visual disturbance.  Respiratory: Negative for cough, shortness of breath and wheezing.   Cardiovascular: Negative for chest pain, palpitations and leg swelling.  Gastrointestinal: Positive for constipation. Negative for abdominal pain, blood in stool, diarrhea, nausea and vomiting.  Genitourinary: Negative for dysuria, flank pain and hematuria.  Musculoskeletal: Positive for gait problem. Negative for back pain.  Skin: Negative for rash and wound.  Neurological: Positive for  speech difficulty. Negative for dizziness, seizures, light-headedness and numbness.  Psychiatric/Behavioral: Negative for behavioral problems and confusion.    Immunization History  Administered Date(s) Administered  . PPD Test 01/15/2017  . Td 09/17/2016   Pertinent  Health Maintenance Due  Topic Date Due  . COLONOSCOPY  06/03/2004  . INFLUENZA VACCINE  04/17/2017  . HEMOGLOBIN A1C  05/01/2017  . OPHTHALMOLOGY EXAM  10/08/2017  . FOOT EXAM  01/21/2018   Fall Risk  01/21/2017  Falls in the past year? Yes  Number falls in past yr: 2 or more  Injury with Fall? Yes  Risk Factor Category  High Fall Risk  Risk for fall due to : History of fall(s);Impaired balance/gait;Impaired mobility  Follow up Falls evaluation completed   Functional Status Survey:    Vitals:   05/10/17 1448  BP: 138/80  Pulse: 71  Resp: 16  Temp: 98.4 F (36.9 C)  TempSrc: Oral  Weight: 226 lb 9.6 oz (102.8 kg)  Height: 5\' 10"  (1.778 m)   Body mass index is 32.51 kg/m. Physical Exam  Constitutional: He is oriented to person, place, and time. No distress.  Obese, adult male in no distress  HENT:  Head: Normocephalic and atraumatic.  Mouth/Throat: Oropharynx is clear and moist. No oropharyngeal exudate.  Eyes: Pupils are equal, round, and reactive to light. Conjunctivae and EOM are normal. Right eye exhibits no discharge. Left eye exhibits no discharge.  Neck: Normal range of motion. Neck supple. No JVD present. No thyromegaly present.  Cardiovascular: Normal rate and regular rhythm.   Pulmonary/Chest: Effort normal and breath sounds normal. He has no wheezes. He has no rales. He exhibits no tenderness.  Abdominal: Soft. Bowel sounds are normal. He exhibits no distension. There is no tenderness. There is no guarding.  Musculoskeletal: He exhibits no edema.  Can move all 4 extremities, no tremors, shuffling gait with small steps, no rigidity, unsteady gait, uses walker  Lymphadenopathy:    He has no  cervical adenopathy.  Neurological: He is alert and oriented to person, place, and time.  Mask like face, slow speech at times  Skin: Skin is warm and dry. No rash noted. He is not diaphoretic. No pallor.  Psychiatric: He has a normal mood and affect. His behavior is normal.    Labs reviewed:  Recent Labs  11/01/16  NA 143  K 4.2  BUN 16  CREATININE 0.9    Recent Labs  11/01/16  AST 14  ALT 15  ALKPHOS 74   No results for input(s): WBC, NEUTROABS, HGB, HCT, MCV, PLT in the last 8760 hours. No results found for: TSH Lab Results  Component Value Date   HGBA1C 5.5 11/01/2016   Lab Results  Component Value Date   CHOL 88 11/01/2016   HDL 33 (A) 11/01/2016   LDLCALC 39 11/01/2016   TRIG 81 11/01/2016    Significant Diagnostic Results in last 30 days:  No results found.  Assessment/Plan  Fall initial encounter Larey Seat yesterday at gym,  no injury on exam, able to move all 4 extremities, neurologically intact. Fall prevention  Unsteady gait To use his walker all the time, fall preventions, ROM stable. Therapy to evaluate for safety awareness  Constipation Add senna s 1 tab every other day and monitor. Encouraged hydration  Hyperlipidemia Lipid Panel     Component Value Date/Time   CHOL 88 11/01/2016   TRIG 81 11/01/2016   HDL 33 (A) 11/01/2016   LDLCALC 39 11/01/2016  LDL at goal. Currently on lipitor 40 mg daily. Recheck lipid panel and if LDL at goal, decrease statin  Insomnia Sleeping well, d/c prn trazodone and monitor. Avoid sedative if possible with his high fall risk  Hypertension controlled BP. Continue metoprolol succinate and lisinopril  CAD S/p stent. Continue metoprolol succinate 100 mg daily, lisinopril 10 mg daily with statin and baby aspirin. Chest pain free.   Parkinson's disease Currently on requip low dose of 0.25 mg daily only. Has masked face, slow and guarded gait. Obtain MMSE to evaluate his memory. Fall precautions. To use his walker.  Make neurology appointment to evaluate further and adjust dosing of requip.   DM type 2 Lab Results  Component Value Date   HGBA1C 5.5 11/01/2016   a1c suggestive of controlled diabetes, not on any medication, given his weight and history of CAD and HTN, check a1c  Obesity With CAD, HTN, HLD and DM. Check a1c and lipid panel along with TSH. Encourage to say active, cut down on sugar intake.    Family/ staff Communication: reviewed care plan with patient and charge nurse.    Labs/tests ordered:  Cbc, cmp, a1c, lipid panel, tsh next lab   [facility], MD Internal Medicine [facility] [address] [place], [place] [address] Cell Phone (Monday-Friday 8 am - 5 pm): [phone] On Call: [phone] and follow prompts after 5 pm and on weekends Office Phone: [phone] Office Fax: [phone]

## 2017-05-14 LAB — LIPID PANEL
Cholesterol: 91 (ref 0–200)
HDL: 34 — AB (ref 35–70)
LDL CALC: 39
LDl/HDL Ratio: 2.7
Triglycerides: 94 (ref 40–160)

## 2017-05-14 LAB — BASIC METABOLIC PANEL
BUN: 16 (ref 4–21)
Creatinine: 0.8 (ref <=1.3)
GLUCOSE: 92
Potassium: 4.1 (ref 3.4–5.3)
Sodium: 142 (ref 137–147)

## 2017-05-14 LAB — HEPATIC FUNCTION PANEL
ALT: 19 (ref 10–40)
AST: 13 — AB (ref 14–40)
Alkaline Phosphatase: 75 (ref 25–125)
Bilirubin, Total: 1

## 2017-05-14 LAB — CBC AND DIFFERENTIAL
HEMATOCRIT: 40 — AB (ref 41–53)
Hemoglobin: 13.3 — AB (ref 13.5–17.5)
PLATELETS: 213 (ref 150–399)
WBC: 7.1

## 2017-05-14 LAB — TSH: TSH: 2.4 (ref <=5.90)

## 2017-05-15 ENCOUNTER — Other Ambulatory Visit: Payer: Self-pay | Admitting: *Deleted

## 2017-05-17 ENCOUNTER — Other Ambulatory Visit: Payer: Self-pay | Admitting: *Deleted

## 2017-05-24 ENCOUNTER — Encounter: Payer: Self-pay | Admitting: *Deleted

## 2017-05-31 ENCOUNTER — Encounter (HOSPITAL_COMMUNITY): Payer: Self-pay | Admitting: Emergency Medicine

## 2017-05-31 ENCOUNTER — Emergency Department (HOSPITAL_COMMUNITY): Payer: BLUE CROSS/BLUE SHIELD

## 2017-05-31 ENCOUNTER — Emergency Department (HOSPITAL_COMMUNITY)
Admission: EM | Admit: 2017-05-31 | Discharge: 2017-06-01 | Disposition: A | Discharge status: Skilled Nursing Facility | Payer: BLUE CROSS/BLUE SHIELD | Attending: Emergency Medicine | Admitting: Emergency Medicine

## 2017-05-31 DIAGNOSIS — Z7982 Long term (current) use of aspirin: Secondary | ICD-10-CM | POA: Insufficient documentation

## 2017-05-31 DIAGNOSIS — Y999 Unspecified external cause status: Secondary | ICD-10-CM | POA: Insufficient documentation

## 2017-05-31 DIAGNOSIS — Z955 Presence of coronary angioplasty implant and graft: Secondary | ICD-10-CM | POA: Insufficient documentation

## 2017-05-31 DIAGNOSIS — Y9301 Activity, walking, marching and hiking: Secondary | ICD-10-CM | POA: Insufficient documentation

## 2017-05-31 DIAGNOSIS — I2510 Atherosclerotic heart disease of native coronary artery without angina pectoris: Secondary | ICD-10-CM | POA: Insufficient documentation

## 2017-05-31 DIAGNOSIS — G20 Parkinson's disease: Secondary | ICD-10-CM | POA: Diagnosis not present

## 2017-05-31 DIAGNOSIS — E119 Type 2 diabetes mellitus without complications: Secondary | ICD-10-CM | POA: Diagnosis not present

## 2017-05-31 DIAGNOSIS — Z79899 Other long term (current) drug therapy: Secondary | ICD-10-CM | POA: Insufficient documentation

## 2017-05-31 DIAGNOSIS — Y92129 Unspecified place in nursing home as the place of occurrence of the external cause: Secondary | ICD-10-CM | POA: Insufficient documentation

## 2017-05-31 DIAGNOSIS — S0101XA Laceration without foreign body of scalp, initial encounter: Secondary | ICD-10-CM | POA: Diagnosis not present

## 2017-05-31 DIAGNOSIS — S0990XA Unspecified injury of head, initial encounter: Secondary | ICD-10-CM | POA: Diagnosis present

## 2017-05-31 DIAGNOSIS — W010XXA Fall on same level from slipping, tripping and stumbling without subsequent striking against object, initial encounter: Secondary | ICD-10-CM | POA: Diagnosis not present

## 2017-05-31 DIAGNOSIS — W19XXXA Unspecified fall, initial encounter: Secondary | ICD-10-CM

## 2017-05-31 DIAGNOSIS — I10 Essential (primary) hypertension: Secondary | ICD-10-CM | POA: Diagnosis not present

## 2017-05-31 MED ORDER — LIDOCAINE-EPINEPHRINE-TETRACAINE (LET) SOLUTION
3.0000 mL | Freq: Once | NASAL | Status: AC
  Administered 2017-05-31: 3 mL via TOPICAL
  Filled 2017-05-31: qty 3

## 2017-05-31 NOTE — ED Triage Notes (Signed)
Brought in by EMS from [facility] at [facility] with c/o head injury after his witnessed fall tonight.  Pt was ambulating when he tripped and fell, sustaining a small scalp laceration on the right side of head---- no active bleeding at this time; pt is not on anticoagulants.  Pt denies headache or dizziness.  Pt has had no loss of consciousness.

## 2017-05-31 NOTE — ED Notes (Signed)
Bed: [facility] Expected date:  Expected time:  Means of arrival:  Comments: 61 yo M  Fall, head injury

## 2017-06-01 ENCOUNTER — Encounter (HOSPITAL_COMMUNITY): Payer: Self-pay

## 2017-06-01 ENCOUNTER — Emergency Department (HOSPITAL_COMMUNITY)
Admission: EM | Admit: 2017-06-01 | Discharge: 2017-06-01 | Disposition: A | Discharge status: Home / Self Care | Payer: BLUE CROSS/BLUE SHIELD | Source: Home / Self Care | Attending: Emergency Medicine | Admitting: Emergency Medicine

## 2017-06-01 DIAGNOSIS — Z79899 Other long term (current) drug therapy: Secondary | ICD-10-CM | POA: Insufficient documentation

## 2017-06-01 DIAGNOSIS — Y999 Unspecified external cause status: Secondary | ICD-10-CM | POA: Insufficient documentation

## 2017-06-01 DIAGNOSIS — Y929 Unspecified place or not applicable: Secondary | ICD-10-CM

## 2017-06-01 DIAGNOSIS — W0110XA Fall on same level from slipping, tripping and stumbling with subsequent striking against unspecified object, initial encounter: Secondary | ICD-10-CM | POA: Insufficient documentation

## 2017-06-01 DIAGNOSIS — G20 Parkinson's disease: Secondary | ICD-10-CM | POA: Insufficient documentation

## 2017-06-01 DIAGNOSIS — I2510 Atherosclerotic heart disease of native coronary artery without angina pectoris: Secondary | ICD-10-CM | POA: Insufficient documentation

## 2017-06-01 DIAGNOSIS — Y939 Activity, unspecified: Secondary | ICD-10-CM

## 2017-06-01 DIAGNOSIS — I10 Essential (primary) hypertension: Secondary | ICD-10-CM

## 2017-06-01 DIAGNOSIS — E119 Type 2 diabetes mellitus without complications: Secondary | ICD-10-CM

## 2017-06-01 DIAGNOSIS — Z7982 Long term (current) use of aspirin: Secondary | ICD-10-CM

## 2017-06-01 DIAGNOSIS — S0181XA Laceration without foreign body of other part of head, initial encounter: Secondary | ICD-10-CM

## 2017-06-01 MED ORDER — LIDOCAINE HCL (PF) 2 % IJ SOLN
INTRAMUSCULAR | Status: AC
  Administered 2017-06-01: 10:00:00
  Filled 2017-06-01: qty 10

## 2017-06-01 MED ORDER — LIDOCAINE HCL (PF) 1 % IJ SOLN: 10.0000 mL | Freq: Once | INTRAMUSCULAR | Status: DC

## 2017-06-01 NOTE — ED Provider Notes (Signed)
[facility]-EMERGENCY DEPT Provider Note   CSN: [id number] & time: 05/31/17  2138     History   Chief Complaint Chief Complaint  Patient presents with  . Fall  . Head Injury    HPI Alan Wells is a 61 y.o. male.  HPI 61 year old Caucasian male past medical history significant for Parkinson's disease, gait instability, diabetes that presents to the emergency department today from nursing facility after a witnessed fall. Spoke with the nursing facility staff but states that patient was ambulating when he turned losing his balance and tripping over his foot causing him to fall and sustained a laceration to his right posterior occiput. Nursing staff states the patient denied any LOC. Has been ambulatory since the event. Patient denies any headache or dizziness. He denies any LOC. Denies any anticoagulations. Patient is at baseline per nursing facility staff. They state the patient does use a walker at baseline however was not using walker this evening was likely caused his fall. Patient is agreeable with this. Patient denies any neck pain, back pain, vision changes, chest pain, abdominal pain, urinary symptoms, paresthesias, weakness. The patient states that his tetanus shot is up-to-date. Past Medical History:  Diagnosis Date  . Adult onset fluency disorder   . Coronary artery disease 2016   s/p stent left anterior descending artery   . Diabetes mellitus type 2, controlled (HCC) 01/18/2016  . Dysphagia 2018  . Gait instability 2018  . History of falling   . Hypercholesterolemia   . Hypertension   . Memory change 2018  . Parkinson's disease (HCC) 2018  . Weight loss 2017   intentional    Patient Active Problem List   Diagnosis Date Noted  . Obesity (BMI 30-39.9) 05/10/2017  . Insomnia 03/19/2017  . Ischemic heart disease   . Hypertension   . Hypercholesterolemia   . Parkinson's disease (HCC) 09/17/2016  . Memory change 09/17/2016  . Unsteady gait 09/17/2016  .  Dysphagia 09/17/2016  . Diabetes mellitus type 2, controlled (HCC) 01/18/2016  . Coronary artery disease 09/17/2014    Past Surgical History:  Procedure Laterality Date  . CAROTID STENT  2012   in left anterior descending artery past EF 55%  . nuclear stress test  06/17/2015   Gated SPECT images reveal normal LV systolic function. EF 55%. Comparing the rest & stress SPECT images, no ischemia. Gated images are normal. Dr. Vernell Barrier, MD [place], [place]        Home Medications    Prior to Admission medications   Medication Sig Start Date End Date Taking? Authorizing Provider  aspirin 81 MG chewable tablet Chew 81 mg by mouth daily.     [provider]  atorvastatin (LIPITOR) 40 MG tablet Take 40 mg by mouth at bedtime. \    [provider]  lisinopril (PRINIVIL,ZESTRIL) 10 MG tablet Take 10 mg by mouth. Take one tablet daily for blood pressure    [provider]  metoprolol succinate (TOPROL-XL) 100 MG 24 hr tablet Take 100 mg by mouth daily.     [provider]  NON FORMULARY Hemp oil -4 sprays sublingual once a day    [provider]  rOPINIRole (REQUIP) 0.25 MG tablet Take 0.25 mg by mouth daily.     [provider]  traZODone (DESYREL) 25 mg TABS tablet Take 25 mg by mouth at bedtime as needed for sleep.    [provider]    Family History Family History  Problem Relation Age of  Onset  . Cancer Mother   . Heart disease Mother     Social History Social History  Substance Use Topics  . Smoking status: Never Smoker  . Smokeless tobacco: Never Used  . Alcohol use No     Allergies   Codeine   Review of Systems Review of Systems  Constitutional: Negative for chills and fever.  Eyes: Negative for visual disturbance.  Cardiovascular: Negative for chest pain.  Gastrointestinal: Negative for abdominal pain, nausea and vomiting.  Musculoskeletal: Negative for back pain, gait problem, neck pain and neck  stiffness.  Skin: Positive for wound.  Neurological: Negative for dizziness, syncope, weakness, numbness and headaches.     Physical Exam Updated Vital Signs BP (!) 150/77 (BP Location: Left Arm)   Pulse (!) 56   Temp 98.6 F (37 C) (Oral)   Resp 18   Ht  (1.778 m)   Wt 102.5 kg (226 lb)   SpO2 99%   BMI 32.43 kg/m   Physical Exam Physical Exam  Constitutional: Pt is oriented to person, place, and time. Appears well-developed and well-nourished. No distress.  HENT:  Head: Normocephalic. Patient with 3 cm laceration to the right occiput with bleeding controlled. Wound edges are well aligned. No foreign body noted. Ears: No bilateral hemotympanum. Nose: Nose normal. No septal hematoma. Mouth/Throat: Uvula is midline, oropharynx is clear and moist and mucous membranes are normal.  Eyes: Conjunctivae and EOM are normal. Pupils are equal, round, and reactive to light.  Neck: No spinous process tenderness and no muscular tenderness present. No rigidity. Normal range of motion present.  Full ROM without pain No midline cervical tenderness No crepitus, deformity or step-offs No paraspinal tenderness  Cardiovascular: Normal rate, regular rhythm and intact distal pulses.   Pulses:      Radial pulses are 2+ on the right side, and 2+ on the left side.       Dorsalis pedis pulses are 2+ on the right side, and 2+ on the left side.       Posterior tibial pulses are 2+ on the right side, and 2+ on the left side.  Pulmonary/Chest: Effort normal and breath sounds normal. No accessory muscle usage. No respiratory distress. No decreased breath sounds. No wheezes. No rhonchi. No rales. Exhibits no tenderness and no bony tenderness.  No flail segment, crepitus or deformity Equal chest expansion  Abdominal: Soft. Normal appearance and bowel sounds are normal. There is no tenderness. There is no rigidity, no guarding and no CVA tenderness.  Abd soft and nontender  Musculoskeletal: Normal  range of motion.       Thoracic back: Exhibits normal range of motion.       Lumbar back: Exhibits normal range of motion.  Full range of motion of the T-spine and L-spine No tenderness to palpation of the spinous processes of the T-spine or L-spine No crepitus, deformity or step-offs No tenderness to palpation of the paraspinous muscles of the L-spine Pelvis is stable.  Lymphadenopathy:    Pt has no cervical adenopathy.  Neurological: Pt is alert and oriented to person, place, and time. Normal reflexes. No cranial nerve deficit. GCS eye subscore is 4. GCS verbal subscore is 5. GCS motor subscore is 6.  Reflex Scores:      Bicep reflexes are 2+ on the right side and 2+ on the left side.      Brachioradialis reflexes are 2+ on the right side and 2+ on the left side.  Patellar reflexes are 2+ on the right side and 2+ on the left side.      Achilles reflexes are 2+ on the right side and 2+ on the left side. Speech is clear and goal oriented, follows commands Normal 5/5 strength in upper and lower extremities bilaterally including dorsiflexion and plantar flexion, strong and equal grip strength Sensation normal to light and sharp touch Moves extremities without ataxia, coordination intact Normal gait and balance No Clonus  Skin: Skin is warm and dry. No rash noted. Pt is not diaphoretic. No erythema.  Psychiatric: Normal mood and affect.  Nursing note and vitals reviewed.     ED Treatments / Results  Labs (all labs ordered are listed, but only abnormal results are displayed) Labs Reviewed - No data to display  EKG  EKG Interpretation None       Radiology Ct Head Wo Contrast  Result Date: 05/31/2017 CLINICAL DATA:  Witnessed fall from standing when ambulating. Right scalp laceration. No loss of consciousness. EXAM: CT HEAD WITHOUT CONTRAST CT CERVICAL SPINE WITHOUT CONTRAST TECHNIQUE: Multidetector CT imaging of the head and cervical spine was performed following the  standard protocol without intravenous contrast. Multiplanar CT image reconstructions of the cervical spine were also generated. COMPARISON:  None. FINDINGS: CT HEAD FINDINGS Brain: Mild generalized atrophy. 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 No intracranial hemorrhage, mass effect, or midline shift. No hydrocephalus. The basilar cisterns are patent. No evidence of territorial infarct or acute ischemia. No extra-axial or intracranial fluid collection. Vascular: Atherosclerosis of skullbase vasculature without hyperdense vessel or abnormal calcification. Skull: No skull fracture or focal lesion. Sinuses/Orbits: No acute finding. Other: None. CT CERVICAL SPINE FINDINGS Alignment: Trace anterolisthesis of C4 on C5 on a degenerative basis. No traumatic subluxation. Skull base and vertebrae: No acute fracture. Vertebral body heights are maintained. The dens and skull base are intact. Soft tissues and spinal canal: No prevertebral fluid or swelling. No visible canal hematoma. Disc levels: Diffuse disc space narrowing and endplate spurring, most prominent at C5-C6. Scattered facet arthropathy. Upper chest: No acute finding. Other: Mild carotid calcifications. IMPRESSION: 1.  No acute intracranial abnormality.  No skull fracture. 2. No fracture or subluxation of the cervical spine. 3. Carotid and skullbase atherosclerosis. Electronically Signed   By: Rubye Oaks M.D.   On: 05/31/2017 23:56   Ct Cervical Spine Wo Contrast  Result Date: 05/31/2017 CLINICAL DATA:  Witnessed fall from standing when ambulating. Right scalp laceration. No loss of consciousness. EXAM: CT HEAD WITHOUT CONTRAST CT CERVICAL SPINE WITHOUT CONTRAST TECHNIQUE: Multidetector CT imaging of the head and cervical spine was performed following the standard protocol without intravenous contrast. Multiplanar CT image reconstructions of the cervical spine were also generated. COMPARISON:  None. FINDINGS: CT HEAD  FINDINGS Brain: Mild generalized atrophy. 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 No intracranial hemorrhage, mass effect, or midline shift. No hydrocephalus. The basilar cisterns are patent. No evidence of territorial infarct or acute ischemia. No extra-axial or intracranial fluid collection. Vascular: Atherosclerosis of skullbase vasculature without hyperdense vessel or abnormal calcification. Skull: No skull fracture or focal lesion. Sinuses/Orbits: No acute finding. Other: None. CT CERVICAL SPINE FINDINGS Alignment: Trace anterolisthesis of C4 on C5 on a degenerative basis. No traumatic subluxation. Skull base and vertebrae: No acute fracture. Vertebral body heights are maintained. The dens and skull base are intact. Soft tissues and spinal canal: No prevertebral fluid or swelling. No visible canal hematoma. Disc  levels: Diffuse disc space narrowing and endplate spurring, most prominent at C5-C6. Scattered facet arthropathy. Upper chest: No acute finding. Other: Mild carotid calcifications. IMPRESSION: 1.  No acute intracranial abnormality.  No skull fracture. 2. No fracture or subluxation of the cervical spine. 3. Carotid and skullbase atherosclerosis. Electronically Signed   By: Rubye Oaks M.D.   On: 05/31/2017 23:56    Procedures Procedures (including critical care time) LACERATION REPAIR Performed by: Demetrios Loll Authorized by: Demetrios Loll Consent: Verbal consent obtained. Risks and benefits: risks, benefits and alternatives were discussed Consent given by: patient Patient identity confirmed: provided demographic data Prepped and Draped in normal sterile fashion Wound explored  Laceration Location: Right occiput  Laceration Length: 3 cm  No Foreign Bodies seen or palpated  Anesthesia: Topical   Local anesthetic: LET  Irrigation method: syringe Amount of cleaning: standard  Skin closure: simple with staples  Number of stapble:  4   Patient tolerance: Patient tolerated the procedure well with no immediate complications.  Medications Ordered in ED Medications  lidocaine-EPINEPHrine-tetracaine (LET) solution (3 mLs Topical Given 05/31/17 2353)     Initial Impression / Assessment and Plan / ED Course  I have reviewed the triage vital signs and the nursing notes.  Pertinent labs & imaging results that were available during my care of the patient were reviewed by me and considered in my medical decision making (see chart for details).     Patient presents from nursing facility for evaluation of a mechanical fall with laceration to the right occiput. Bleeding controlled. Denies LOC. Spoke with the nursing facility staff states the patient is at baseline. Patient was not using walker while ambulating this evening. The patient states that his tetanus shot is up-to-date. No focal neuro deficits on exam. Neurovascularly intact in all extremities. No signs of intrathoracic, intra-abdominal injury. Patient has been ambulatory in the ED with normal gait.   Pressure irrigation performed. Laceration occurred < 8 hours prior to repair which was well tolerated. Pt has no co morbidities to effect normal wound healing. Discussed suture home care w pt and answered questions. Pt to f-u for wound check and staple removal in 7 days.   Pt is hemodynamically stable, in NAD, & able to ambulate in the ED. Evaluation does not show pathology that would require ongoing emergent intervention or inpatient treatment. I explained the diagnosis to the patient. Pain has been managed & has no complaints prior to dc. Pt is comfortable with above plan and is stable for discharge at this time. All questions were answered prior to disposition. Strict return precautions for f/u to the ED were discussed. Encouraged follow up with PCP.    Final Clinical Impressions(s) / ED Diagnoses   Final diagnoses:  Fall, initial encounter  Laceration of scalp,  initial encounter    New Prescriptions New Prescriptions   No medications on file     Wallace Keller 06/01/17 0120    Dione Booze, MD 06/01/17 [phone]

## 2017-06-01 NOTE — ED Notes (Signed)
Pt ambulated to the restroom with minimal assistance. Pt was steady on his feet.

## 2017-06-01 NOTE — ED Triage Notes (Signed)
Per EMS, patient comes from [facility]. Pt was getting ready when he fell and hit his head on the corner of the wall. Deep lac above L eye. No LOC, blood thinners or pain. Hx parkinsons. A&O.

## 2017-06-01 NOTE — ED Provider Notes (Signed)
LACERATION REPAIR Performed by: Garlon Hatchet Authorized by: Garlon Hatchet Consent: Verbal consent obtained. Risks and benefits: risks, benefits and alternatives were discussed Consent given by: patient Patient identity confirmed: provided demographic data Prepped and Draped in normal sterile fashion Wound explored  Laceration Location: left eyebrow  Laceration Length: 4cm  No Foreign Bodies seen or palpated  Anesthesia: local infiltration  Local anesthetic: lidocaine 2% without epinephrine  Anesthetic total: 5 ml  Irrigation method: syringe Amount of cleaning: standard  Skin closure: 4-0 prolene  Number of sutures: 4  Technique: simple interrupted  Patient tolerance: Patient tolerated the procedure well with no immediate complications.    Garlon Hatchet, PA-C 06/01/17 1610    Linwood Dibbles, MD 06/01/17 1539

## 2017-06-01 NOTE — ED Notes (Signed)
Attempted to call report to [facility]. Message left at nurses station.

## 2017-06-01 NOTE — ED Notes (Signed)
Bed: ZO10 Expected date: 06/01/17 Expected time: 8:40 AM Means of arrival: Ambulance Comments: Fall

## 2017-06-01 NOTE — ED Notes (Signed)
[facility] here to transport pt back to [facility] at [facility].

## 2017-06-01 NOTE — Discharge Instructions (Signed)
The CT scan of your head and neck showed no fractures. Motrin and Tylenol for pain as needed. Make sure to follow up with her primary care doctor.  WOUND CARE Please have your stitches/staples removed in 5-7 days or sooner if you have concerns. You may do this at any available urgent care or at your primary care doctor's office.  Keep area clean and dry for 24 hours. Do not remove bandage, if applied.  After 24 hours, remove bandage and wash wound gently with mild soap and warm water. Reapply a new bandage after cleaning wound, if directed.  Continue daily cleansing with soap and water until stitches/staples are removed.  Do not apply any ointments or creams to the wound while stitches/staples are in place, as this may cause delayed healing.  Seek medical careif you experience any of the following signs of infection: Swelling, redness, pus drainage, streaking, fever >101.0 F  Seek care if you experience excessive bleeding that does not stop after 15-20 minutes of constant, firm pressure.

## 2017-06-01 NOTE — Discharge Instructions (Signed)
Suture removal in 5 days

## 2017-06-01 NOTE — ED Provider Notes (Signed)
[facility]-EMERGENCY DEPT Provider Note   CSN: [id number] Arrival date & time: 06/01/17  0854     History   Chief Complaint Chief Complaint  Patient presents with  . Fall  . Head Laceration    HPI Alan Wells is a 61 y.o. male.  HPI Patient presents to the emergency room for evaluation of a laceration associated with the fall. Patient has a history of Parkinson's. He has difficulty with his balance and gait as a result of that. She actually fell yesterday unfortunately.  hE was walking and tripped striking the back of his head sustaining a laceration. The patient was evaluated in the emergency room.he had CT scans of his head and neck.Those were normal. His laceration was repaired and he was discharged. Patient states he was doing fine neck his living facility. This morning he was getting dressed. He was pulling his pull-up up on his own without any assistance when he lost his balance and fell forward striking his head.  Patient sustained a laceration above his left eye. He came in to the emergency room for evaluation. He denies any trouble with any headache or neck pain. No numbness or weakness. No fevers or chills. Past Medical History:  Diagnosis Date  . Adult onset fluency disorder   . Coronary artery disease 2016   s/p stent left anterior descending artery   . Diabetes mellitus type 2, controlled (HCC) 01/18/2016  . Dysphagia 2018  . Gait instability 2018  . History of falling   . Hypercholesterolemia   . Hypertension   . Memory change 2018  . Parkinson's disease (HCC) 2018  . Weight loss 2017   intentional    Patient Active Problem List   Diagnosis Date Noted  . Obesity (BMI 30-39.9) 05/10/2017  . Insomnia 03/19/2017  . Ischemic heart disease   . Hypertension   . Hypercholesterolemia   . Parkinson's disease (HCC) 09/17/2016  . Memory change 09/17/2016  . Unsteady gait 09/17/2016  . Dysphagia 09/17/2016  . Diabetes mellitus type 2, controlled (HCC) 01/18/2016  .  Coronary artery disease 09/17/2014    Past Surgical History:  Procedure Laterality Date  . CAROTID STENT  2012   in left anterior descending artery past EF 55%  . nuclear stress test  06/17/2015   Gated SPECT images reveal normal LV systolic function. EF 55%. Comparing the rest & stress SPECT images, no ischemia. Gated images are normal. Dr. Vernell Barrier, MD [facility] [place], [place]        Home Medications    Prior to Admission medications   Medication Sig Start Date End Date Taking? Authorizing Provider  aspirin 81 MG chewable tablet Chew 81 mg by mouth daily.    Yes [provider]  atorvastatin (LIPITOR) 40 MG tablet Take 40 mg by mouth at bedtime. \   Yes [provider]  lisinopril (PRINIVIL,ZESTRIL) 10 MG tablet Take 10 mg by mouth. Take one tablet daily for blood pressure   Yes [provider]  metoprolol succinate (TOPROL-XL) 100 MG 24 hr tablet Take 100 mg by mouth daily.    Yes [provider]  NON FORMULARY Hemp oil -4 sprays sublingual once a day   Yes [provider]  rOPINIRole (REQUIP) 0.25 MG tablet Take 0.25 mg by mouth daily.    Yes [provider]  traZODone (DESYREL) 25 mg TABS tablet Take 25 mg by mouth at bedtime as needed for sleep.   Yes [provider]    Family History Family History  Problem Relation Age of Onset  . Cancer Mother   . Heart disease Mother     Social History Social History  Substance Use Topics  . Smoking status: Never Smoker  . Smokeless tobacco: Never Used  . Alcohol use No     Allergies   Codeine   Review of Systems Review of Systems  All other systems reviewed and are negative.    Physical Exam Updated Vital Signs BP 136/73   Pulse (!) 56   Temp 97.6 F (36.4 C)   Resp 13   Ht 1.778 m ( )   Wt 102.5 kg (226 lb)   SpO2 95%   BMI 32.43 kg/m   Physical Exam  Constitutional: No distress.  HENT:  Head: Normocephalic.  Right Ear: External ear normal.    Left Ear: External ear normal.  Stapled scalp laceration posterior right, no active bleeding, no wound dehiscence, irregular laceration above the lateral aspect of the left eyebrow, no bony ttp  Eyes: Conjunctivae are normal. Right eye exhibits no discharge. Left eye exhibits no discharge. No scleral icterus.  Neck: Neck supple. No tracheal deviation present.  No ttp  Cardiovascular: Normal rate, regular rhythm and intact distal pulses.   Pulmonary/Chest: Effort normal and breath sounds normal. No stridor. No respiratory distress. He has no wheezes. He has no rales.  Abdominal: Soft. Bowel sounds are normal. He exhibits no distension. There is no tenderness. There is no rebound and no guarding.  Musculoskeletal: He exhibits no edema or tenderness.  Neurological: He is alert. He has normal strength. He displays no tremor. No cranial nerve deficit (no facial droop, extraocular movements intact, no slurred speech) or sensory deficit. He exhibits normal muscle tone. He displays no seizure activity. Coordination abnormal. GCS eye subscore is 4. GCS verbal subscore is 5.  Movements are slow, no focal deficits,   Skin: Skin is warm and dry. No rash noted.  Psychiatric: He has a normal mood and affect.  Nursing note and vitals reviewed.    ED Treatments / Results    Radiology Ct Head Wo Contrast  Result Date: 05/31/2017 CLINICAL DATA:  Witnessed fall from standing when ambulating. Right scalp laceration. No loss of consciousness. EXAM: CT HEAD WITHOUT CONTRAST CT CERVICAL SPINE WITHOUT CONTRAST TECHNIQUE: Multidetector CT imaging of the head and cervical spine was performed following the standard protocol without intravenous contrast. Multiplanar CT image reconstructions of the cervical spine were also generated. COMPARISON:  None. FINDINGS: CT HEAD FINDINGS Brain: Mild generalized atrophy. 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 No intracranial hemorrhage, mass  effect, or midline shift. No hydrocephalus. The basilar cisterns are patent. No evidence of territorial infarct or acute ischemia. No extra-axial or intracranial fluid collection. Vascular: Atherosclerosis of skullbase vasculature without hyperdense vessel or abnormal calcification. Skull: No skull fracture or focal lesion. Sinuses/Orbits: No acute finding. Other: None. CT CERVICAL SPINE FINDINGS Alignment: Trace anterolisthesis of C4 on C5 on a degenerative basis. No traumatic subluxation. Skull base and vertebrae: No acute fracture. Vertebral body heights are maintained. The dens and skull base are intact. Soft tissues and spinal canal: No prevertebral fluid or swelling. No visible canal hematoma. Disc levels: Diffuse disc space narrowing and endplate spurring, most prominent at C5-C6. Scattered facet arthropathy. Upper chest: No acute finding. Other: Mild carotid calcifications. IMPRESSION: 1.  No acute intracranial abnormality.  No skull fracture. 2. No fracture or subluxation of the cervical spine. 3. Carotid and skullbase atherosclerosis. Electronically Signed  By: Rubye Oaks M.D.   On: 05/31/2017 23:56   Ct Cervical Spine Wo Contrast  Result Date: 05/31/2017 CLINICAL DATA:  Witnessed fall from standing when ambulating. Right scalp laceration. No loss of consciousness. EXAM: CT HEAD WITHOUT CONTRAST CT CERVICAL SPINE WITHOUT CONTRAST TECHNIQUE: Multidetector CT imaging of the head and cervical spine was performed following the standard protocol without intravenous contrast. Multiplanar CT image reconstructions of the cervical spine were also generated. COMPARISON:  None. FINDINGS: CT HEAD FINDINGS Brain: Mild generalized atrophy. 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 No intracranial hemorrhage, mass effect, or midline shift. No hydrocephalus. The basilar cisterns are patent. No evidence of territorial infarct or acute ischemia. No extra-axial or intracranial fluid  collection. Vascular: Atherosclerosis of skullbase vasculature without hyperdense vessel or abnormal calcification. Skull: No skull fracture or focal lesion. Sinuses/Orbits: No acute finding. Other: None. CT CERVICAL SPINE FINDINGS Alignment: Trace anterolisthesis of C4 on C5 on a degenerative basis. No traumatic subluxation. Skull base and vertebrae: No acute fracture. Vertebral body heights are maintained. The dens and skull base are intact. Soft tissues and spinal canal: No prevertebral fluid or swelling. No visible canal hematoma. Disc levels: Diffuse disc space narrowing and endplate spurring, most prominent at C5-C6. Scattered facet arthropathy. Upper chest: No acute finding. Other: Mild carotid calcifications. IMPRESSION: 1.  No acute intracranial abnormality.  No skull fracture. 2. No fracture or subluxation of the cervical spine. 3. Carotid and skullbase atherosclerosis. Electronically Signed   By: Rubye Oaks M.D.   On: 05/31/2017 23:56    Procedures Procedures (including critical care time)  Medications Ordered in ED Medications  lidocaine (PF) (XYLOCAINE) 1 % injection 10 mL (10 mLs Infiltration Not Given 06/01/17 0934)  lidocaine (XYLOCAINE) 2 % injection (  Given 06/01/17 0944)     Initial Impression / Assessment and Plan / ED Course  I have reviewed the triage vital signs and the nursing notes.  Pertinent labs & imaging results that were available during my care of the patient were reviewed by me and considered in my medical decision making (see chart for details).   Pt presents with recurrent falls.  Hx of parkinson causing gait instability.  Pt was unassisted when he was getting dressed.  No neuro deficits.  No systemic complaints.  Doubt TIA, stroke or other acute medical issue.   Laceration repaired by PA Allyne Gee.  Tolerated well.   Final Clinical Impressions(s) / ED Diagnoses   Final diagnoses:  Facial laceration, initial encounter    New Prescriptions New  Prescriptions   No medications on file     Linwood Dibbles, MD 06/01/17 1003

## 2017-06-01 NOTE — ED Notes (Signed)
Per Dr. Lynelle Doctor okay for patient to be d/c with brother to go back to [facility].

## 2017-06-03 ENCOUNTER — Non-Acute Institutional Stay: Payer: BLUE CROSS/BLUE SHIELD | Admitting: Nurse Practitioner

## 2017-06-03 ENCOUNTER — Encounter: Payer: Self-pay | Admitting: Nurse Practitioner

## 2017-06-03 DIAGNOSIS — T07XXXA Unspecified multiple injuries, initial encounter: Secondary | ICD-10-CM

## 2017-06-03 DIAGNOSIS — K5900 Constipation, unspecified: Secondary | ICD-10-CM | POA: Diagnosis not present

## 2017-06-03 DIAGNOSIS — G20 Parkinson's disease: Secondary | ICD-10-CM

## 2017-06-03 DIAGNOSIS — W19XXXA Unspecified fall, initial encounter: Secondary | ICD-10-CM | POA: Insufficient documentation

## 2017-06-03 DIAGNOSIS — W19XXXD Unspecified fall, subsequent encounter: Secondary | ICD-10-CM

## 2017-06-03 DIAGNOSIS — G4700 Insomnia, unspecified: Secondary | ICD-10-CM | POA: Diagnosis not present

## 2017-06-03 DIAGNOSIS — I10 Essential (primary) hypertension: Secondary | ICD-10-CM

## 2017-06-03 NOTE — Assessment & Plan Note (Deleted)
Senna S I po qod

## 2017-06-03 NOTE — Assessment & Plan Note (Signed)
The right parietal with 4 staples closure and left lateral eyebrow with 4 sutures closure, no signs of infection, no focal neurological symptoms, observe.

## 2017-06-03 NOTE — Progress Notes (Signed)
Location:  [facility] Room Number: 915 Place of Service:  ALF [phone]) Provider:  Fathima Bartl, Manxie  NP  Oneal Grout, MD  Patient Care Team: Oneal Grout, MD as PCP - General (Internal Medicine) Xandrea Clarey X, NP as Nurse Practitioner (Internal Medicine)  Extended Emergency Contact Information Primary Emergency Contact: Albin,Frank Address: [address]          [place], [place] [address] Alan Wells Home Phone: [phone] Mobile Phone: [phone] Relation: Brother  Code Status:  DNR Goals of care: Advanced Directive information Advanced Directives 06/03/2017  Does Patient Have a Medical Advance Directive? Yes  Type of Advance Directive Out of facility DNR (pink MOST or yellow form)  Does patient want to make changes to medical advance directive? No - Patient declined  Copy of Healthcare Power of Attorney in Chart? -  Pre-existing out of facility DNR order (yellow form or pink MOST form) Yellow form placed in chart (order not valid for inpatient use)     Chief Complaint  Patient presents with  . Acute Visit    Fall x 2 with laceration to the scalp(9/14) and face(9/15)     HPI:  Pt is a 61 y.o. Wells seen today for an acute visit for fell and head laceration,05/31/17 evening. The resident was standing by his closet changing his cloths when the MT advised him she had his meds. The resident attempted to turn to get his meds and lost balance, fell to the floor hitting his head on the right side. Right parietal laceration with 4 staples closures.   CT head/Cervical spine  05/31/17 showed 1.  No acute intracranial abnormality.  No skull fracture. 2. No fracture or subluxation of the cervical spine. 3. Carotid and skullbase atherosclerosis.   06/01/17 morning fell again: the resident was sitting in chair with blood running down side of left head, the resident stated he had fallen and got himself up off the floor. He said he hit the left side of his head above  the left eye was pulling his brief off. The left forehead wound is closed with 4 staples in ED    Past Medical History:  Diagnosis Date  . Adult onset fluency disorder   . Coronary artery disease 2016   s/p stent left anterior descending artery   . Diabetes mellitus type 2, controlled (HCC) 01/18/2016  . Dysphagia 2018  . Gait instability 2018  . History of falling   . Hypercholesterolemia   . Hypertension   . Memory change 2018  . Parkinson's disease (HCC) 2018  . Weight loss 2017   intentional   Past Surgical History:  Procedure Laterality Date  . CAROTID STENT  2012   in left anterior descending artery past EF 55%  . nuclear stress test  06/17/2015   Gated SPECT images reveal normal LV systolic function. EF 55%. Comparing the rest & stress SPECT images, no ischemia. Gated images are normal. Dr. Vernell Barrier, MD [facility] [place], [place]     Allergies  Allergen Reactions  . Codeine     Outpatient Encounter Prescriptions as of 06/03/2017  Medication Sig  . acetaminophen (TYLENOL) 325 MG tablet Take 650 mg by mouth every 4 (four) hours as needed. Give 650 mg every 4 hours  for temp greater than 100.3 x 24 hours. Notify MD/NP of continued elevated fevers.  [name] aspirin 81 MG chewable tablet Chew 81 mg by mouth daily.   [name] atorvastatin (LIPITOR) 40 MG tablet Take 40 mg by  mouth at bedtime. \  . carbidopa-levodopa (SINEMET IR) 25-100 MG tablet Take 1 tablet by mouth 3 (three) times daily.  [name] lisinopril (PRINIVIL,ZESTRIL) 10 MG tablet Take 10 mg by mouth. Take one tablet daily for blood pressure  . metoprolol succinate (TOPROL-XL) 100 MG 24 hr tablet Take 100 mg by mouth daily.   . NON FORMULARY Hemp oil -4 sprays sublingual once a day  . rOPINIRole (REQUIP) 0.25 MG tablet Take 0.25 mg by mouth daily.   . sennosides-docusate sodium (SENOKOT-S) 8.6-50 MG tablet Take 1 tablet by mouth daily. Given every other day  . [DISCONTINUED] traZODone (DESYREL) 25 mg TABS tablet Take 25 mg by mouth at  bedtime as needed for sleep.   No facility-administered encounter medications on file as of 06/03/2017.     Review of Systems  Constitutional: Negative for activity change, appetite change, chills, diaphoresis, fatigue and fever.  HENT: Negative for congestion.   Eyes: Negative for visual disturbance.  Respiratory: Negative for cough, choking and shortness of breath.   Cardiovascular: Negative for chest pain, palpitations and leg swelling.  Gastrointestinal: Negative for abdominal distention and abdominal pain.  Genitourinary: Negative for difficulty urinating, dysuria, frequency and urgency.       Urinary leakage, uses adult brief.   Musculoskeletal: Positive for gait problem. Negative for arthralgias, back pain, joint swelling, myalgias and neck pain.  Skin: Positive for wound. Negative for pallor and rash.       Scalp and facial lacerations.   Neurological: Negative for dizziness, tremors, seizures, syncope, facial asymmetry, speech difficulty, weakness, numbness and headaches.  Psychiatric/Behavioral: Negative for agitation, behavioral problems, confusion, hallucinations and sleep disturbance. The patient is not nervous/anxious.     Immunization History  Administered Date(s) Administered  . PPD Test 01/15/2017  . Td 09/17/2016   Pertinent  Health Maintenance Due  Topic Date Due  . COLONOSCOPY  06/03/2004  . INFLUENZA VACCINE  04/17/2017  . HEMOGLOBIN A1C  05/01/2017  . OPHTHALMOLOGY EXAM  10/08/2017  . FOOT EXAM  01/21/2018   Fall Risk  01/21/2017  Falls in the past year? Yes  Number falls in past yr: 2 or more  Injury with Fall? Yes  Risk Factor Category  High Fall Risk  Risk for fall due to : History of fall(s);Impaired balance/gait;Impaired mobility  Follow up Falls evaluation completed   Functional Status Survey:    Vitals:   06/03/17 1119  BP: 140/Alan  Pulse: 60  Resp: 18  Temp: 97.7 F (36.5 C)  SpO2: 96%  Weight: 230 lb (104.3 kg)  Height:  (1.778 m)    Body mass index is 33 kg/m. Physical Exam  Constitutional: He is oriented to person, place, and time. He appears well-developed and well-nourished. No distress.  HENT:  Head: Normocephalic and atraumatic.  Right Ear: External ear normal.  Left Ear: External ear normal.  Mouth/Throat: Oropharynx is clear and moist. No oropharyngeal exudate.  Eyes: Pupils are equal, round, and reactive to light. Conjunctivae and EOM are normal. Right eye exhibits no discharge. Left eye exhibits no discharge.  Neck: Normal range of motion. Neck supple. No JVD present. No thyromegaly present.  Cardiovascular: Normal rate and regular rhythm.   No murmur heard. Pulmonary/Chest: Effort normal and breath sounds normal. He has no wheezes. He has no rales.  Musculoskeletal: Normal range of motion. He exhibits no edema or tenderness.  Falls x2   Lymphadenopathy:    He has no cervical adenopathy.  Neurological: He is alert and oriented to  person, place, and time. No cranial nerve deficit. Coordination abnormal.  Parkinson's disease  Skin: Skin is warm and dry. No rash noted. He is not diaphoretic. No erythema. No pallor.  The right parietal scalp laceration with 4 staples closure and right left facial laceration about the lateral left eyebrow with 4 sutures closure, intact, no signs of infection.   Psychiatric: He has a normal mood and affect. His behavior is normal. Judgment and thought content normal.    Labs reviewed:  Recent Labs  11/01/16 05/14/17  NA 143 142  K 4.2 4.1  BUN 16 16  CREATININE 0.9 0.8    Recent Labs  11/01/16 05/14/17  AST 14 13*  ALT 15 19  ALKPHOS 74 75    Recent Labs  05/14/17  WBC 7.1  HGB 13.3*  HCT 40*  PLT 213   Lab Results  Component Value Date   TSH 2.40 05/14/2017   Lab Results  Component Value Date   HGBA1C 5.5 11/01/2016   Lab Results  Component Value Date   CHOL 91 05/14/2017   HDL 34 (A) 05/14/2017   LDLCALC 39 05/14/2017   TRIG 94 05/14/2017     Significant Diagnostic Results in last 30 days:  Ct Head Wo Contrast  Result Date: 05/31/2017 CLINICAL DATA:  Witnessed fall from standing when ambulating. Right scalp laceration. No loss of consciousness. EXAM: CT HEAD WITHOUT CONTRAST CT CERVICAL SPINE WITHOUT CONTRAST TECHNIQUE: Multidetector CT imaging of the head and cervical spine was performed following the standard protocol without intravenous contrast. Multiplanar CT image reconstructions of the cervical spine were also generated. COMPARISON:  None. FINDINGS: CT HEAD FINDINGS Brain: Mild generalized atrophy. 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 No intracranial hemorrhage, mass effect, or midline shift. No hydrocephalus. The basilar cisterns are patent. No evidence of territorial infarct or acute ischemia. No extra-axial or intracranial fluid collection. Vascular: Atherosclerosis of skullbase vasculature without hyperdense vessel or abnormal calcification. Skull: No skull fracture or focal lesion. Sinuses/Orbits: No acute finding. Other: None. CT CERVICAL SPINE FINDINGS Alignment: Trace anterolisthesis of C4 on C5 on a degenerative basis. No traumatic subluxation. Skull base and vertebrae: No acute fracture. Vertebral body heights are maintained. The dens and skull base are intact. Soft tissues and spinal canal: No prevertebral fluid or swelling. No visible canal hematoma. Disc levels: Diffuse disc space narrowing and endplate spurring, most prominent at C5-C6. Scattered facet arthropathy. Upper chest: No acute finding. Other: Mild carotid calcifications. IMPRESSION: 1.  No acute intracranial abnormality.  No skull fracture. 2. No fracture or subluxation of the cervical spine. 3. Carotid and skullbase atherosclerosis. Electronically Signed   By: Rubye Oaks M.D.   On: 05/31/2017 23:56   Ct Cervical Spine Wo Contrast  Result Date: 05/31/2017 CLINICAL DATA:  Witnessed fall from standing when ambulating.  Right scalp laceration. No loss of consciousness. EXAM: CT HEAD WITHOUT CONTRAST CT CERVICAL SPINE WITHOUT CONTRAST TECHNIQUE: Multidetector CT imaging of the head and cervical spine was performed following the standard protocol without intravenous contrast. Multiplanar CT image reconstructions of the cervical spine were also generated. COMPARISON:  None. FINDINGS: CT HEAD FINDINGS Brain: Mild generalized atrophy. 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 No intracranial hemorrhage, mass effect, or midline shift. No hydrocephalus. The basilar cisterns are patent. No evidence of territorial infarct or acute ischemia. No extra-axial or intracranial fluid collection. Vascular: Atherosclerosis of skullbase vasculature without hyperdense vessel or abnormal calcification. Skull: No  skull fracture or focal lesion. Sinuses/Orbits: No acute finding. Other: None. CT CERVICAL SPINE FINDINGS Alignment: Trace anterolisthesis of C4 on C5 on a degenerative basis. No traumatic subluxation. Skull base and vertebrae: No acute fracture. Vertebral body heights are maintained. The dens and skull base are intact. Soft tissues and spinal canal: No prevertebral fluid or swelling. No visible canal hematoma. Disc levels: Diffuse disc space narrowing and endplate spurring, most prominent at C5-C6. Scattered facet arthropathy. Upper chest: No acute finding. Other: Mild carotid calcifications. IMPRESSION: 1.  No acute intracranial abnormality.  No skull fracture. 2. No fracture or subluxation of the cervical spine. 3. Carotid and skullbase atherosclerosis. Electronically Signed   By: Rubye Oaks M.D.   On: 05/31/2017 23:56    Assessment/Plan Insomnia Off Trazodone prn since 05/10/17  Laceration of multiple sites of skin The right parietal with 4 staples closure and left lateral eyebrow with 4 sutures closure, no signs of infection, no focal neurological symptoms, observe.   Fall Mechanical falls, PT  to eval and treat. The patient is at risk of falling because of unsteady gait related to Parkinson's disease  Parkinson's disease (HCC) Falling, frequently, slow walking with walker, slow speech, taking Requip 0.25mg  qd and Sinemet 25/100mg  tid. F/u Neurology  Hypertension Controlled, continue Metoprolol , Lisinopril , 05/15/17 cholesterol 91, triglycerides 94, HDL 34, LDL 39, Na 142, K 4.1, Bun 16, creat 0.82, wbc 7.1, Hgb 13.3, plt 213     Family/ staff Communication: plan of care reviewed with the patient, the patient's brother HPOA, and charge nurse.   Labs/tests ordered:  none  Time spend 25 minutes

## 2017-06-03 NOTE — Assessment & Plan Note (Signed)
Mechanical falls, PT to eval and treat. The patient is at risk of falling because of unsteady gait related to Parkinson's disease

## 2017-06-03 NOTE — Assessment & Plan Note (Addendum)
Controlled, continue Metoprolol , Lisinopril , 05/15/17 cholesterol 91, triglycerides 94, HDL 34, LDL 39, Na 142, K 4.1, Bun 16, creat 0.82, wbc 7.1, Hgb 13.3, plt 213

## 2017-06-03 NOTE — Assessment & Plan Note (Signed)
Off Trazodone prn since 05/10/17

## 2017-06-03 NOTE — Assessment & Plan Note (Signed)
Falling, frequently, slow walking with walker, slow speech, taking Requip 0.25mg  qd and Sinemet 25/100mg  tid. F/u Neurology

## 2017-08-09 ENCOUNTER — Non-Acute Institutional Stay: Payer: BLUE CROSS/BLUE SHIELD | Admitting: Nurse Practitioner

## 2017-08-09 ENCOUNTER — Encounter: Payer: Self-pay | Admitting: Nurse Practitioner

## 2017-08-09 DIAGNOSIS — K5900 Constipation, unspecified: Secondary | ICD-10-CM | POA: Diagnosis not present

## 2017-08-09 DIAGNOSIS — I10 Essential (primary) hypertension: Secondary | ICD-10-CM

## 2017-08-09 DIAGNOSIS — R413 Other amnesia: Secondary | ICD-10-CM

## 2017-08-09 DIAGNOSIS — G231 Progressive supranuclear ophthalmoplegia [Steele-Richardson-Olszewski]: Secondary | ICD-10-CM

## 2017-08-09 NOTE — Assessment & Plan Note (Signed)
The patient has been diagnosed Progressive Supranuclear palsy per last Neurology visit 05/16/17, he was started on Sinemet and titrated up to 25/100mg  tid, he ambulates with walker, not disabling, no recent falls, continue Sinemet 25/100mg  tid and Requip 0.25mg  po qd.

## 2017-08-09 NOTE — Assessment & Plan Note (Signed)
No constipation, his bowel pattern is qod, on Senokot S qod.

## 2017-08-09 NOTE — Progress Notes (Signed)
Location:   [facility] Room Number: 915 Place of Service: [place] [facility] Provider:  Arna Snipe Linus Weckerly NP  Oneal Grout, MD  Patient Care Team: Oneal Grout, MD as PCP - General (Internal Medicine) Milania Haubner X, NP as Nurse Practitioner (Internal Medicine)  Extended Emergency Contact Information Primary Emergency Contact: Henshaw,Frank Address: [address]          [place], [place] [address] Darden [place] of [place] Home Phone: [phone] Mobile Phone: [phone] Relation: Brother  Code Status:  DNR Goals of care: Advanced Directive information Advanced Directives 06/03/2017  Does Patient Have a Medical Advance Directive? Yes  Type of Advance Directive Out of facility DNR (pink MOST or yellow form)  Does patient want to make changes to medical advance directive? No - Patient declined  Copy of Healthcare Power of Attorney in Chart? -  Pre-existing out of facility DNR order (yellow form or pink MOST form) Yellow form placed in chart (order not valid for inpatient use)     Chief Complaint  Patient presents with  . Medical Management of Chronic Issues    HPI:  Pt is a 61 y.o. male seen today for medical management of chronic diseases.     The patient has been diagnosed Progressive Supranuclear palsy per last Neurology visit 05/16/17, he was started on Sinemet and titrated up to 25/100mg  tid, he ambulates with walker, not disabling, no recent falls, he also takes Requip 0.25mg  po qd. No constipation, his bowel pattern is qod, on Senokot S qod. His blood pressure is controlled on Lisinopril 10mg  qd, Metoprolol 100mg  daily. [place] resides in [place], last MMSE 29/30 05/23/17   Past Medical History:  Diagnosis Date  . Adult onset fluency disorder   . Coronary artery disease 2016   s/p stent left anterior descending artery   . Diabetes mellitus type 2, controlled (HCC) 01/18/2016  . Dysphagia 2018  . Gait instability 2018  . History of falling   . Hypercholesterolemia   . Hypertension   .  Memory change 2018  . Parkinson's disease (HCC) 2018  . Weight loss 2017   intentional   Past Surgical History:  Procedure Laterality Date  . CAROTID STENT  2012   in left anterior descending artery past EF 55%  . nuclear stress test  06/17/2015   Gated SPECT images reveal normal LV systolic function. EF 55%. Comparing the rest & stress SPECT images, no ischemia. Gated images are normal. Dr. Vernell Barrier, MD [place], [place]     Allergies  Allergen Reactions  . Codeine     Allergies as of 08/09/2017      Reactions   Codeine       Medication List        Accurate as of 08/09/17 11:59 PM. Always use your most recent med list.          acetaminophen 325 MG tablet Commonly known as:  TYLENOL Take 650 mg by mouth every 4 (four) hours as needed. Give 650 mg every 4 hours  for temp greater than 100.3 x 24 hours. Notify MD/NP of continued elevated fevers.   aspirin 81 MG chewable tablet Chew 81 mg by mouth daily.   atorvastatin 40 MG tablet Commonly known as:  LIPITOR Take 40 mg by mouth at bedtime. \   carbidopa-levodopa 25-100 MG tablet Commonly known as:  SINEMET IR Take 1 tablet by mouth 3 (three) times daily.   lisinopril 10 MG tablet Commonly known as:  PRINIVIL,ZESTRIL Take 10 mg by mouth. Take  one tablet daily for blood pressure   metoprolol succinate 100 MG 24 hr tablet Commonly known as:  TOPROL-XL Take 100 mg by mouth daily.   NON FORMULARY Hemp oil -4 sprays sublingual once a day   rOPINIRole 0.25 MG tablet Commonly known as:  REQUIP Take 0.25 mg by mouth daily.   sennosides-docusate sodium 8.6-50 MG tablet Commonly known as:  SENOKOT-S Take 1 tablet by mouth every other day.       Review of Systems  Constitutional: Negative for activity change, appetite change, chills, diaphoresis, fatigue and fever.  HENT: Negative for congestion, hearing loss, trouble swallowing and voice change.   Eyes: Negative for visual disturbance.  Respiratory:  Negative for cough, choking, shortness of breath and wheezing.   Cardiovascular: Negative for chest pain, palpitations and leg swelling.  Gastrointestinal: Negative for abdominal distention, abdominal pain, constipation, diarrhea, nausea and vomiting.  Endocrine: Negative for cold intolerance.  Genitourinary: Negative for difficulty urinating, dysuria, frequency and urgency.       Urinate x1/night  Musculoskeletal: Positive for gait problem. Negative for arthralgias and back pain.  Skin: Negative for pallor, rash and wound.  Neurological: Positive for speech difficulty. Negative for dizziness, tremors, seizures, weakness and headaches.       A surprised like  facial expression. Hesitation with speech. Stiffness and awkward movements.   Psychiatric/Behavioral: Negative for agitation, behavioral problems, confusion, hallucinations and sleep disturbance. The patient is not nervous/anxious.     Immunization History  Administered Date(s) Administered  . Influenza-Unspecified 07/08/2017  . PPD Test 01/15/2017  . Td 09/17/2016   Pertinent  Health Maintenance Due  Topic Date Due  . COLONOSCOPY  06/03/2004  . HEMOGLOBIN A1C  05/01/2017  . OPHTHALMOLOGY EXAM  10/08/2017  . FOOT EXAM  01/21/2018  . INFLUENZA VACCINE  Completed   Fall Risk  01/21/2017  Falls in the past year? Yes  Number falls in past yr: 2 or more  Injury with Fall? Yes  Risk Factor Category  High Fall Risk  Risk for fall due to : History of fall(s);Impaired balance/gait;Impaired mobility  Follow up Falls evaluation completed   Functional Status Survey:    Vitals:   08/09/17 1517  BP: 126/76  Pulse: 78  Resp: 18  Temp: 98.3 F (36.8 C)   There is no height or weight on file to calculate BMI. Physical Exam  Constitutional: He is oriented to person, place, and time. He appears well-developed and well-nourished.  HENT:  Head: Normocephalic and atraumatic.  Eyes: Conjunctivae and EOM are normal. Pupils are equal,  round, and reactive to light.  Neck: Normal range of motion. Neck supple. No JVD present. No thyromegaly present.  Cardiovascular: Normal rate, regular rhythm and normal heart sounds.  No murmur heard. Pulmonary/Chest: Effort normal and breath sounds normal. He has no wheezes. He has no rales.  Abdominal: Soft. Bowel sounds are normal. He exhibits no distension. There is no tenderness.  Musculoskeletal: Normal range of motion. He exhibits no tenderness.  Neurological: He is alert and oriented to person, place, and time. He exhibits normal muscle tone. Coordination abnormal.  Surprised like facial looks. Hesitation speech. Awkward body movements. Ambulates with walker in small steps. Transfer self.   Skin: Skin is warm and dry. No rash noted. No pallor.  Psychiatric: He has a normal mood and affect. His behavior is normal. Judgment and thought content normal.    Labs reviewed: Recent Labs    11/01/16 05/14/17  NA 143 142  K 4.2 4.1  BUN 16 16  CREATININE 0.9 0.8   Recent Labs    11/01/16 05/14/17  AST 14 13*  ALT 15 19  ALKPHOS 74 75   Recent Labs    05/14/17  WBC 7.1  HGB 13.3*  HCT 40*  PLT 213   Lab Results  Component Value Date   TSH 2.40 05/14/2017   Lab Results  Component Value Date   HGBA1C 5.5 11/01/2016   Lab Results  Component Value Date   CHOL 91 05/14/2017   HDL 34 (A) 05/14/2017   LDLCALC 39 05/14/2017   TRIG 94 05/14/2017    Significant Diagnostic Results in last 30 days:  No results found.  Assessment/Plan  Progressive supranuclear palsy (HCC) The patient has been diagnosed Progressive Supranuclear palsy per last Neurology visit 05/16/17, he was started on Sinemet and titrated up to 25/100mg  tid, he ambulates with walker, not disabling, no recent falls, continue Sinemet 25/100mg  tid and Requip 0.25mg  po qd.  Constipation No constipation, his bowel pattern is qod, on Senokot S qod.   Memory change He resides in [place], last MMSE 29/30  05/23/17    Hypertension His blood pressure is controlled, continue Lisinopril 10mg  qd, Metoprolol 100mg  daily.    Family/ staff Communication: plan of care reviewed with the patient and charge nurse  Labs/tests ordered:  None  Time spend 25 minutes

## 2017-08-09 NOTE — Assessment & Plan Note (Signed)
His blood pressure is controlled, continue Lisinopril 10mg  qd, Metoprolol 100mg  daily.

## 2017-08-09 NOTE — Assessment & Plan Note (Signed)
[place] resides in [place], last MMSE 29/30 05/23/17

## 2017-11-29 ENCOUNTER — Non-Acute Institutional Stay: Payer: BLUE CROSS/BLUE SHIELD | Admitting: Internal Medicine

## 2017-11-29 ENCOUNTER — Encounter: Payer: Self-pay | Admitting: Internal Medicine

## 2017-11-29 DIAGNOSIS — I2510 Atherosclerotic heart disease of native coronary artery without angina pectoris: Secondary | ICD-10-CM

## 2017-11-29 DIAGNOSIS — G20A1 Parkinson's disease without dyskinesia, without mention of fluctuations: Secondary | ICD-10-CM | POA: Insufficient documentation

## 2017-11-29 DIAGNOSIS — E119 Type 2 diabetes mellitus without complications: Secondary | ICD-10-CM

## 2017-11-29 DIAGNOSIS — E669 Obesity, unspecified: Secondary | ICD-10-CM | POA: Diagnosis not present

## 2017-11-29 DIAGNOSIS — R4189 Other symptoms and signs involving cognitive functions and awareness: Secondary | ICD-10-CM

## 2017-11-29 DIAGNOSIS — K5901 Slow transit constipation: Secondary | ICD-10-CM

## 2017-11-29 DIAGNOSIS — E7800 Pure hypercholesterolemia, unspecified: Secondary | ICD-10-CM

## 2017-11-29 DIAGNOSIS — G20 Parkinson's disease: Secondary | ICD-10-CM

## 2017-11-29 DIAGNOSIS — I10 Essential (primary) hypertension: Secondary | ICD-10-CM | POA: Diagnosis not present

## 2017-11-29 NOTE — Progress Notes (Signed)
Location:  [facility] Room Number: 915 Place of Service:  [facility] [phone]) Provider:  Oneal Grout MD  Oneal Grout, MD  Patient Care Team: Oneal Grout, MD as PCP - General (Internal Medicine) [name], [name], NP as Nurse Practitioner (Internal Medicine)  Extended Emergency Contact Information Primary Emergency Contact: Clabo,Frank Address: [address]          [place], [place] [address] D[place] of [place] Home Phone: [phone] Mobile Phone: [phone] Relation: Brother  Code Status:  DNR  Goals of care: Advanced Directive information Advanced Directives 11/29/2017  Does Patient Have a Medical Advance Directive? Yes  Type of Estate agent of [place];Out of facility DNR (pink MOST or yellow form)  Does patient want to make changes to medical advance directive? No - Patient declined  Copy of Healthcare Power of Attorney in Chart? Yes  Pre-existing out of facility DNR order (yellow form or pink MOST form) Yellow form placed in chart (order not valid for inpatient use)     Chief Complaint  Patient presents with  . Medical Management of Chronic Issues    Routine Visit     HPI:  Pt is a 61 y.o. male seen today for medical management of chronic diseases.    Parkinson's disease- uses walker for ambulation, has bars to the side of his seat to help support him in standing up safely. No fall reported. Currently takes sinemet 25-100 mg half a tab qid and requip.  Hyperlipidemia- currently on atorvastatin 40 mg daily.   Hypertension- controlled BP readings on review. Taking lisinopril 10 mg daily and toprol xl 100 mg daily with baby aspirin.  Cognitive impairment- takes donepezil for now  Constipation- has bowel movement every other day, takes senokot s 1 tab qod.    Past Medical History:  Diagnosis Date  . Adult onset fluency disorder   . Coronary artery disease 2016   s/p stent left anterior descending artery   . Diabetes  mellitus type 2, controlled (HCC) 01/18/2016  . Dysphagia 2018  . Gait instability 2018  . History of falling   . Hypercholesterolemia   . Hypertension   . Memory change 2018  . Parkinson's disease (HCC) 2018  . Weight loss 2017   intentional   Past Surgical History:  Procedure Laterality Date  . CAROTID STENT  2012   in left anterior descending artery past EF 55%  . nuclear stress test  06/17/2015   Gated SPECT images reveal normal LV systolic function. EF 55%. Comparing the rest & stress SPECT images, no ischemia. Gated images are normal. Dr. Vernell Barrier, MD [place] [place], [place]     Allergies  Allergen Reactions  . Codeine     Outpatient Encounter Medications as of 11/29/2017  Medication Sig  . aspirin 81 MG chewable tablet Chew 81 mg by mouth daily.   [name] atorvastatin (LIPITOR) 40 MG tablet Take 40 mg by mouth at bedtime. \  . carbidopa-levodopa (SINEMET IR) 25-100 MG tablet Take by mouth 4 (four) times daily. Take 1 1/2 tablet  . chlorhexidine (PERIDEX) 0.12 % solution Use as directed 5 mLs in the mouth or throat at bedtime.  . donepezil (ARICEPT) 10 MG tablet Take 10 mg by mouth at bedtime.  [name] lisinopril (PRINIVIL,ZESTRIL) 10 MG tablet Take 10 mg by mouth daily.   . metoprolol succinate (TOPROL-XL) 100 MG 24 hr tablet Take 100 mg by mouth daily.   . NON FORMULARY Hemp oil -4 sprays sublingual once a day  .  rOPINIRole (REQUIP) 0.25 MG tablet Take 0.25 mg by mouth daily.   . sennosides-docusate sodium (SENOKOT-S) 8.6-50 MG tablet Take 1 tablet by mouth every other day.   . [DISCONTINUED] acetaminophen (TYLENOL) 325 MG tablet Take 650 mg by mouth every 4 (four) hours as needed. Give 650 mg every 4 hours  for temp greater than 100.3 x 24 hours. Notify MD/NP of continued elevated fevers.   No facility-administered encounter medications on file as of 11/29/2017.     Review of Systems  Constitutional: Negative for appetite change, chills, fatigue and fever.  HENT: Negative for  congestion, ear discharge, ear pain, hearing loss, mouth sores, rhinorrhea, sinus pressure, sinus pain and trouble swallowing.   Eyes: Positive for visual disturbance.  Respiratory: Negative for cough, shortness of breath and wheezing.   Cardiovascular: Negative for chest pain, palpitations and leg swelling.  Gastrointestinal: Positive for constipation. Negative for abdominal pain, diarrhea, nausea and vomiting.  Genitourinary: Negative for dysuria and hematuria.  Musculoskeletal: Positive for gait problem. Negative for back pain.  Neurological: Negative for dizziness, seizures, numbness and headaches.  Hematological: Does not bruise/bleed easily.  Psychiatric/Behavioral: Negative for behavioral problems, confusion and sleep disturbance.    Immunization History  Administered Date(s) Administered  . Influenza-Unspecified 07/08/2017  . PPD Test 01/15/2017  . Td 09/17/2016   Pertinent  Health Maintenance Due  Topic Date Due  . COLONOSCOPY  06/03/2004  . HEMOGLOBIN A1C  05/01/2017  . OPHTHALMOLOGY EXAM  10/08/2017  . FOOT EXAM  01/21/2018  . INFLUENZA VACCINE  Completed   Fall Risk  01/21/2017  Falls in the past year? Yes  Number falls in past yr: 2 or more  Injury with Fall? Yes  Risk Factor Category  High Fall Risk  Risk for fall due to : History of fall(s);Impaired balance/gait;Impaired mobility  Follow up Falls evaluation completed   Functional Status Survey:    Vitals:   11/29/17 1415  BP: 140/80  Pulse: 76  Resp: 20  Temp: (!) 97.5 F (36.4 C)  TempSrc: Oral  SpO2: 97%  Weight: 239 lb 6.4 oz (108.6 kg)  Height: 5\' 10"  (1.778 m)   Body mass index is 34.35 kg/m.   Wt Readings from Last 3 Encounters:  11/29/17 239 lb 6.4 oz (108.6 kg)  06/03/17 230 lb (104.3 kg)  06/01/17 226 lb (102.5 kg)   Physical Exam  Constitutional: He is oriented to person, place, and time. No distress.  Obese, adult male in no acute distress  HENT:  Head: Normocephalic and atraumatic.   Right Ear: External ear normal.  Left Ear: External ear normal.  Nose: Nose normal.  Mouth/Throat: Oropharynx is clear and moist. No oropharyngeal exudate.  Eyes: Conjunctivae and EOM are normal. Pupils are equal, round, and reactive to light. Right eye exhibits no discharge. Left eye exhibits no discharge.  Neck: Normal range of motion. Neck supple.  Cardiovascular: Normal rate and regular rhythm.  Pulmonary/Chest: Effort normal and breath sounds normal. No respiratory distress. He has no wheezes. He has no rales.  Abdominal: Soft. Bowel sounds are normal. There is no tenderness. There is no guarding.  Musculoskeletal: He exhibits edema. He exhibits no tenderness.  Trace leg edema, unsteady gait, uses walker for ambulation, small steps   Lymphadenopathy:    He has no cervical adenopathy.  Neurological: He is alert and oriented to person, place, and time.  Has trouble formulating words at times with paucity in speech, mask like facial expression, no tremor  05/24/17 MMSE 29/30  Skin: Skin is warm and dry. No rash noted. He is not diaphoretic.  Psychiatric: He has a normal mood and affect.    Labs reviewed: Recent Labs    05/14/17  NA 142  K 4.1  BUN 16  CREATININE 0.8   Recent Labs    05/14/17  AST 13*  ALT 19  ALKPHOS 75   Recent Labs    05/14/17  WBC 7.1  HGB 13.3*  HCT 40*  PLT 213   Lab Results  Component Value Date   TSH 2.40 05/14/2017   Lab Results  Component Value Date   HGBA1C 5.5 11/01/2016   Lab Results  Component Value Date   CHOL 91 05/14/2017   HDL 34 (A) 05/14/2017   LDLCALC 39 05/14/2017   TRIG 94 05/14/2017    Significant Diagnostic Results in last 30 days:  No results found.  Assessment/Plan  1. Essential hypertension Controlled, continue metoprolol succinate 100 mg daily and lisinopril 10 mg daily. Check CMP.   2. Controlled type 2 diabetes mellitus without complication, without long-term current use of insulin (HCC) Reviewed a1c  5.5  Controlled diabetes. Continue aspirin and statin. On ACEI.   3. Hypercholesterolemia Check lipid panel. Continue hyperlipidemia 40 mg daily.   4. Obesity (BMI 30-39.9) Patient has been working with PT for gait training and strengthening exercises. counselled on diet and activities. Check TSH  5. Slow transit constipation Continue senokot s current regimen, maintain hydration  6. Cognitive impairment Supportive care, continue donepezil 10 mg daily.   7. Parkinson's disease (HCC) Continue sinemet current regimen with requip, neurology to establish care.  8. Coronary artery disease involving native coronary artery of native heart without angina pectoris Chest pain free. Continue aspirin, statin, b blcoker and ACEI.   Family/ staff Communication: reviewed care plan with patient and charge nurse.    Labs/tests ordered:  Cbc, cmp, lipid panel, TSH   Oneal Grout, MD Internal Medicine [facility] [address] [place], [place] [address] Cell Phone (Monday-Friday 8 am - 5 pm): [phone] On Call: [phone] and follow prompts after 5 pm and on weekends Office Phone: [phone] Office Fax: [phone]

## 2017-12-03 LAB — CBC AND DIFFERENTIAL
HCT: 39 — AB (ref 41–53)
Hemoglobin: 13.2 — AB (ref 13.5–17.5)
PLATELETS: 189 (ref 150–399)
WBC: 6.6

## 2017-12-03 LAB — HEPATIC FUNCTION PANEL
ALT: 17 (ref 10–40)
AST: 13 — AB (ref 14–40)
Alkaline Phosphatase: 77 (ref 25–125)
BILIRUBIN, TOTAL: 0.9

## 2017-12-03 LAB — LIPID PANEL
Cholesterol: 87 (ref 0–200)
HDL: 27 — AB (ref 35–70)
LDL CALC: 39
LDl/HDL Ratio: 3.2
Triglycerides: 131 (ref 40–160)

## 2017-12-03 LAB — BASIC METABOLIC PANEL
BUN: 15 (ref 4–21)
CREATININE: 0.8 (ref <=1.3)
Glucose: 99
POTASSIUM: 3.9 (ref 3.4–5.3)
SODIUM: 138 (ref 137–147)

## 2017-12-03 LAB — TSH: TSH: 1.7 (ref <=5.90)

## 2017-12-04 ENCOUNTER — Other Ambulatory Visit: Payer: Self-pay | Admitting: *Deleted

## 2018-02-28 ENCOUNTER — Encounter: Payer: Self-pay | Admitting: Nurse Practitioner

## 2018-02-28 ENCOUNTER — Non-Acute Institutional Stay: Payer: BLUE CROSS/BLUE SHIELD | Admitting: Nurse Practitioner

## 2018-02-28 DIAGNOSIS — W19XXXD Unspecified fall, subsequent encounter: Secondary | ICD-10-CM | POA: Diagnosis not present

## 2018-02-28 DIAGNOSIS — R2681 Unsteadiness on feet: Secondary | ICD-10-CM | POA: Diagnosis not present

## 2018-02-28 DIAGNOSIS — I10 Essential (primary) hypertension: Secondary | ICD-10-CM | POA: Diagnosis not present

## 2018-02-28 DIAGNOSIS — K5901 Slow transit constipation: Secondary | ICD-10-CM

## 2018-02-28 DIAGNOSIS — G20 Parkinson's disease: Secondary | ICD-10-CM | POA: Diagnosis not present

## 2018-02-28 DIAGNOSIS — R4189 Other symptoms and signs involving cognitive functions and awareness: Secondary | ICD-10-CM

## 2018-02-28 NOTE — Progress Notes (Signed)
Location:  [facility] Room Number: 696 Place of Service:  ALF [phone]) Provider:  [name], Lennie Odor  NP  Blanchie Serve, MD  Patient Care Team: Blanchie Serve, MD as PCP - General (Internal Medicine) [name], [name], NP as Nurse Practitioner (Internal Medicine)  Extended Emergency Contact Information Primary Emergency Contact: Kinsel,Frank Address: [place], [place] [address] [address]te [place]itter of [place] Phone: [phone] Mobile Phone: [phone] Relation: Brother  Code Status:  DNR Goals of care: Advanced Directive information Advanced Directives 02/28/2018  Does Patient Have a Medical Advance Directive? Yes  Type of Paramedic of [place];Out of facility DNR (pink MOST or yellow form)  Does patient want to make changes to medical advance directive? No - Patient declined  Copy of [place] in Chart? Yes  Pre-existing out of facility DNR order (yellow form or pink MOST form) Yellow form placed in chart (order not valid for inpatient use)     Chief Complaint  Patient presents with  . Medical Management of Chronic Issues    F/U- HTN, Parkinson's, CAD,     HPI:  Pt is a 62 y.o. male seen today for medical management of chronic diseases.     The patient has history of Parkinson's resides in [facility], ambulates with walker, frequent falls, last fall 02/27/18 when he stumbled backwards into wall in Tai-Chi, slid down wall landing softly on buttocks. He takes Requip 0.'25mg'$  qd, Sinemet 25/'100mg'$  qid. HTN blood pressure is controlled on Lisinopril '10mg'$  qd, Metoprolol '100mg'$  qd. No constipation while on Senolot S I qod. His memory is preserved on Donepezil. [facility] resides in [facility], ambulates with walker.   Past Medical History:  Diagnosis Date  . Adult onset fluency disorder   . Coronary artery disease 2016   s/p stent left anterior descending artery   . Diabetes mellitus type 2, controlled ([facility]) 01/18/2016  .  Dysphagia 2018  . Gait instability 2018  . History of falling   . Hypercholesterolemia   . Hypertension   . Memory change 2018  . Parkinson's disease ([facility]) 2018  . Weight loss 2017   intentional   Past Surgical History:  Procedure Laterality Date  . CAROTID STENT  2012   in left anterior descending artery past EF 55%  . nuclear stress test  06/17/2015   Gated SPECT images reveal normal LV systolic function. EF 55%. Comparing the rest & stress SPECT images, no ischemia. Gated images are normal. Dr. [name], MD [name], [name]     Allergies  Allergen Reactions  . Codeine     Outpatient Encounter Medications as of 02/28/2018  Medication Sig  . aspirin 81 MG chewable tablet Chew 81 mg by mouth daily.   [name] atorvastatin (LIPITOR) 40 MG tablet Take 40 mg by mouth at bedtime. \  . carbidopa-levodopa (SINEMET IR) 25-100 MG tablet Take by mouth 4 (four) times daily. Take 1 1/2 tablet  . chlorhexidine (PERIDEX) 0.12 % solution Use as directed 5 mLs in the mouth or throat at bedtime.  . donepezil (ARICEPT) 10 MG tablet Take 10 mg by mouth at bedtime.  [name] lisinopril (PRINIVIL,ZESTRIL) 10 MG tablet Take 10 mg by mouth daily.   . metoprolol succinate (TOPROL-XL) 100 MG 24 hr tablet Take 100 mg by mouth daily.   . NON FORMULARY Hemp oil -4 sprays sublingual once a day  . rOPINIRole (REQUIP) 0.25 MG tablet Take 0.25 mg  by mouth daily.   . sennosides-docusate sodium (SENOKOT-S) 8.6-50 MG tablet Take 1 tablet by mouth every other day.    No facility-administered encounter medications on file as of 02/28/2018.     Review of Systems  Constitutional: Negative for activity change, appetite change, chills, diaphoresis, fatigue and fever.  HENT: Negative for congestion, hearing loss, trouble swallowing and voice change.   Respiratory: Negative for cough, shortness of breath and wheezing.   Cardiovascular: Negative for chest pain, palpitations and leg swelling.  Gastrointestinal: Negative for  abdominal distention, abdominal pain, constipation, diarrhea, nausea and vomiting.  Genitourinary: Negative for difficulty urinating, dysuria and urgency.  Musculoskeletal: Positive for gait problem.  Neurological: Positive for speech difficulty. Negative for dizziness, facial asymmetry, weakness and headaches.       Memory lapses. Hesitation of speech  Psychiatric/Behavioral: Negative for agitation, behavioral problems, hallucinations and sleep disturbance. The patient is not nervous/anxious.     Immunization History  Administered Date(s) Administered  . Influenza-Unspecified 07/08/2017  . PPD Test 01/15/2017  . Td 09/17/2016   Pertinent  Health Maintenance Due  Topic Date Due  . COLONOSCOPY  06/03/2004  . HEMOGLOBIN A1C  05/01/2017  . OPHTHALMOLOGY EXAM  10/08/2017  . FOOT EXAM  01/21/2018  . INFLUENZA VACCINE  04/17/2018   Fall Risk  01/21/2017  Falls in the past year? Yes  Number falls in past yr: 2 or more  Injury with Fall? Yes  Risk Factor Category  High Fall Risk  Risk for fall due to : History of fall(s);Impaired balance/gait;Impaired mobility  Follow up Falls evaluation completed   Functional Status Survey:    Vitals:   02/28/18 1133  BP: 136/80  Pulse: 74  Resp: 18  Temp: 97.6 F (36.4 C)  Weight: 245 lb 9.6 oz (111.4 kg)  Height: '5\' 10"'$  (1.778 m)   Body mass index is 35.24 kg/m. Physical Exam  Constitutional: He appears well-developed and well-nourished.  HENT:  Head: Normocephalic and atraumatic.  Eyes: Pupils are equal, round, and reactive to light. EOM are normal.  Neck: Normal range of motion. Neck supple. No JVD present. No thyromegaly present.  Cardiovascular: Normal rate and regular rhythm.  No murmur heard. Pulmonary/Chest: Effort normal and breath sounds normal. He has no wheezes. He has no rales.  Abdominal: Soft. Bowel sounds are normal. He exhibits no distension. There is no tenderness. There is no rebound and no guarding.  Musculoskeletal:  He exhibits no edema or tenderness.  Self transfer, ambulates with walker. Stiffness and awkward movement.   Neurological: He is alert. No cranial nerve deficit. He exhibits normal muscle tone. Coordination abnormal.  Oriented to person and place. Stiffness and awkward movements.   Skin: Skin is warm and dry.  Psychiatric: His behavior is normal.  Surprised facial looks.     Labs reviewed: Recent Labs    05/14/17 12/03/17  NA 142 138  K 4.1 3.9  BUN 16 15  CREATININE 0.8 0.8   Recent Labs    05/14/17 12/03/17  AST 13* 13*  ALT 19 17  ALKPHOS 75 77   Recent Labs    05/14/17 12/03/17  WBC 7.1 6.6  HGB 13.3* 13.2*  HCT 40* 39*  PLT 213 189   Lab Results  Component Value Date   TSH 1.70 12/03/2017   Lab Results  Component Value Date   HGBA1C 5.5 11/01/2016   Lab Results  Component Value Date   CHOL 87 12/03/2017   HDL 27 (A) 12/03/2017   Daniel  39 12/03/2017   TRIG 131 12/03/2017    Significant Diagnostic Results in last 30 days:  No results found.  Assessment/Plan Hypertension HTN blood pressure is controlled, continue Lisinopril '10mg'$  qd, Metoprolol '100mg'$  qd.    Parkinson's disease ([facility]) Parkinson's resides in [facility], ambulates with walker, frequent falls, last fall 02/27/18 when he stumbled backwards into wall in Tai-Chi, slid down wall landing softly on buttocks. Continue Requip 0.'25mg'$  qd, Sinemet 25/'100mg'$  qid.   Cognitive impairment His memory is preserved, continue Donepezil. [facility] resides in [facility], ambulates with walker.    Fall Continue close supervision for safety. Lack of safety awareness as progression of impaired cognition and increased frailty related to parkinson's disease are contributory.   Constipation Stable, continue Senokot S I qod.   Unsteady gait Continue to ambulate with walker.      Family/ staff Communication: plan of care reviewed with the patient and charge nurse.   Labs/tests ordered:  none  Time spend 25 minutes.

## 2018-02-28 NOTE — Assessment & Plan Note (Signed)
Continue close supervision for safety. Lack of safety awareness as progression of impaired cognition and increased frailty related to parkinson's disease are contributory.

## 2018-02-28 NOTE — Assessment & Plan Note (Signed)
His memory is preserved, continue Donepezil. [facility] resides [facility], ambulates with walker.

## 2018-02-28 NOTE — Assessment & Plan Note (Signed)
HTN blood pressure is controlled, continue Lisinopril 10mg  qd, Metoprolol 100mg  qd.

## 2018-02-28 NOTE — Assessment & Plan Note (Signed)
Continue to ambulate with walker.  

## 2018-02-28 NOTE — Assessment & Plan Note (Signed)
Parkinson's resides in [facility], ambulates with walker, frequent falls, last fall 02/27/18 when he stumbled backwards into wall in Tai-[place], slid down wall landing softly on buttocks. Continue Requip 0.70m qd, Sinemet 25/1038mqid.

## 2018-02-28 NOTE — Assessment & Plan Note (Signed)
Stable, continue Senokot S I qod.  

## 2018-04-30 ENCOUNTER — Encounter: Payer: Self-pay | Admitting: Internal Medicine

## 2018-06-06 ENCOUNTER — Non-Acute Institutional Stay: Payer: BLUE CROSS/BLUE SHIELD | Admitting: Internal Medicine

## 2018-06-06 ENCOUNTER — Encounter: Payer: Self-pay | Admitting: Internal Medicine

## 2018-06-06 DIAGNOSIS — R2681 Unsteadiness on feet: Secondary | ICD-10-CM

## 2018-06-06 DIAGNOSIS — R4189 Other symptoms and signs involving cognitive functions and awareness: Secondary | ICD-10-CM | POA: Diagnosis not present

## 2018-06-06 DIAGNOSIS — E119 Type 2 diabetes mellitus without complications: Secondary | ICD-10-CM

## 2018-06-06 DIAGNOSIS — I10 Essential (primary) hypertension: Secondary | ICD-10-CM | POA: Diagnosis not present

## 2018-06-06 DIAGNOSIS — G20 Parkinson's disease: Secondary | ICD-10-CM | POA: Diagnosis not present

## 2018-06-06 DIAGNOSIS — E7800 Pure hypercholesterolemia, unspecified: Secondary | ICD-10-CM

## 2018-06-06 NOTE — Progress Notes (Signed)
Location:  [facility] Room Number: 915 Place of Service:  ALF [phone]) Provider:  Oneal GroutMahima Colleene Swarthout MD  Oneal GroutPandey, Shomari Scicchitano, MD  Patient Care Team: Oneal GroutPandey, Arthur Aydelotte, MD as PCP - General (Internal Medicine) [name], [name], NP as Nurse Practitioner (Internal Medicine)  Extended Emergency Contact Information Primary Emergency Contact: Gillette,Frank Address: [address]          [place], [place] [address] [place] Home Phone: [phone] Mobile Phone: [phone] Relation: Brother  Code Status:  DNR  Goals of care: Advanced Directive information Advanced Directives 02/28/2018  Does Patient Have a Medical Advance Directive? Yes  Type of Estate agentAdvance Directive Healthcare Power of [place];Out of facility DNR (pink MOST or yellow form)  Does patient want to make changes to medical advance directive? No - Patient declined  Copy of Healthcare Power of Attorney in Chart? Yes  Pre-existing out of facility DNR order (yellow form or pink MOST form) Yellow form placed in chart (order not valid for inpatient use)     Chief Complaint  Patient presents with  . Medical Management of Chronic Issues    routine 3 months follow up    HPI:  Pt is a 62 y.o. male seen today for medical management of chronic diseases.    Unsteady gait- working with therapy team to strengthen his gait.   Parkinson's disease- takes sinemet IR 25-100 MG QID. On donepezil for cognitive impairment. Also on requip.  Hyperlipidemia- takes simvastatin 40 mg daily, denies muscle aches/ pain.   Essential hypertension0 takes metoprolol succinate 100 mg daily with lisinopril 10 mg daily. Also on daily baby aspirin.    Past Medical History:  Diagnosis Date  . Adult onset fluency disorder   . Coronary artery disease 2016   s/p stent left anterior descending artery   . Diabetes mellitus type 2, controlled ([facility]) 01/18/2016  . Dysphagia 2018  . Gait instability 2018  . History of falling   .  Hypercholesterolemia   . Hypertension   . Memory change 2018  . Parkinson's disease ([facility]) 2018  . Weight loss 2017   intentional   Past Surgical History:  Procedure Laterality Date  . CAROTID STENT  2012   in left anterior descending artery past EF 55%  . nuclear stress test  06/17/2015   Gated SPECT images reveal normal LV systolic function. EF 55%. Comparing the rest & stress SPECT images, no ischemia. Gated images are normal. Dr. Vernell BarrierWaheed Akhtar, MD [place]     Allergies  Allergen Reactions  . Codeine     Outpatient Encounter Medications as of 06/06/2018  Medication Sig  . aspirin 81 MG chewable tablet Chew 81 mg by mouth daily.   [name]. atorvastatin (LIPITOR) 40 MG tablet Take 40 mg by mouth at bedtime. \  . carbidopa-levodopa (SINEMET IR) 25-100 MG tablet Take by mouth 4 (four) times daily. Take 1 1/2 tablet  . chlorhexidine (PERIDEX) 0.12 % solution Use as directed 5 mLs in the mouth or throat at bedtime.  . donepezil (ARICEPT) 10 MG tablet Take 10 mg by mouth at bedtime.  [name]. lisinopril (PRINIVIL,ZESTRIL) 10 MG tablet Take 10 mg by mouth daily.   . metoprolol succinate (TOPROL-XL) 100 MG 24 hr tablet Take 100 mg by mouth daily.   . NON FORMULARY Hemp oil -4 sprays sublingual once a day  . rOPINIRole (REQUIP) 0.25 MG tablet Take 0.25 mg by mouth daily.   . sennosides-docusate sodium (SENOKOT-S) 8.6-50 MG tablet Take 1 tablet by  mouth every other day.    No facility-administered encounter medications on file as of 06/06/2018.     Review of Systems  Constitutional: Negative for appetite change, chills, fatigue and fever.  HENT: Negative for congestion, mouth sores, rhinorrhea, sinus pressure and trouble swallowing.   Respiratory: Negative for cough and shortness of breath.   Cardiovascular: Negative for chest pain and palpitations.  Gastrointestinal: Positive for constipation. Negative for abdominal pain, diarrhea, nausea and vomiting.       Takes medication to help with  bowel movement  Genitourinary: Negative for dysuria, flank pain and frequency.  Musculoskeletal: Positive for gait problem. Negative for back pain.       Uses walker for ambulation  Skin: Negative for rash.  Neurological: Negative for dizziness and headaches.    Immunization History  Administered Date(s) Administered  . Influenza-Unspecified 07/08/2017  . PPD Test 01/15/2017  . Td 09/17/2016   Pertinent  Health Maintenance Due  Topic Date Due  . COLONOSCOPY  06/03/2004  . HEMOGLOBIN A1C  05/01/2017  . OPHTHALMOLOGY EXAM  10/08/2017  . FOOT EXAM  01/21/2018  . INFLUENZA VACCINE  04/17/2018   Fall Risk  01/21/2017  Falls in the past year? Yes  Number falls in past yr: 2 or more  Injury with Fall? Yes  Risk Factor Category  High Fall Risk  Risk for fall due to : History of fall(s);Impaired balance/gait;Impaired mobility  Follow up Falls evaluation completed   Functional Status Survey:    Vitals:   06/06/18 1204  BP: 136/80  Pulse: 76  Resp: 20  Temp: (!) 97.2 F (36.2 C)  SpO2: 94%  Weight: 248 lb (112.5 kg)  Height: 5\' 10"  (1.778 m)   Body mass index is 35.58 kg/m.   Wt Readings from Last 3 Encounters:  06/06/18 248 lb (112.5 kg)  02/28/18 245 lb 9.6 oz (111.4 kg)  11/29/17 239 lb 6.4 oz (108.6 kg)   Physical Exam  Constitutional: He is oriented to person, place, and time. He appears well-nourished. No distress.  Obese male  HENT:  Head: Normocephalic and atraumatic.  Nose: Nose normal.  Mouth/Throat: Oropharynx is clear and moist. No oropharyngeal exudate.  Eyes: Pupils are equal, round, and reactive to light. Conjunctivae and EOM are normal. Right eye exhibits no discharge. Left eye exhibits no discharge.  Neck: Normal range of motion. Neck supple.  Cardiovascular: Normal rate and regular rhythm.  Pulmonary/Chest: Effort normal and breath sounds normal. He has no wheezes. He has no rales.  Abdominal: Soft. Bowel sounds are normal. There is no tenderness.  There is no guarding.  Musculoskeletal: He exhibits edema.  Can move all 4 extremities, unsteady gait  Lymphadenopathy:    He has no cervical adenopathy.  Neurological: He is alert and oriented to person, place, and time. He exhibits normal muscle tone.  Mask like facial expression, normal tone, small steps while walking  Skin: Skin is warm and dry. He is not diaphoretic.  Psychiatric: He has a normal mood and affect. His behavior is normal.    Labs reviewed: Recent Labs    12/03/17  NA 138  K 3.9  BUN 15  CREATININE 0.8   Recent Labs    12/03/17  AST 13*  ALT 17  ALKPHOS 77   Recent Labs    12/03/17  WBC 6.6  HGB 13.2*  HCT 39*  PLT 189   Lab Results  Component Value Date   TSH 1.70 12/03/2017   Lab Results  Component Value  Date   HGBA1C 5.5 11/01/2016   Lab Results  Component Value Date   CHOL 87 12/03/2017   HDL 27 (A) 12/03/2017   LDLCALC 39 12/03/2017   TRIG 131 12/03/2017    Significant Diagnostic Results in last 30 days:  No results found.  Assessment/Plan  1. Essential hypertension SBP stable 130-150. Continue metoprolol succinate 100 mg daily and lisinopril 10 mg daily.   2. Controlled type 2 diabetes mellitus without complication, without long-term current use of insulin ([facility]) Continue aspirin and statin. Check a1c, cbc, CMP, lipid.   3. Parkinson's disease ([facility]) Continue sinemet current regimen and requip.   4. Cognitive impairment Continue donepezil and supportive care.   5. Hypercholesterolemia Check lipid, continue atorvastatin  6. Unsteady gait Will have patient continue work with PT/OT as tolerated to regain strength and restore function.  Fall precautions are in place.    Family/ staff Communication: reviewed care plan with patient and charge nurse.    Labs/tests ordered:  Cmp, cbc, lipid, a1c 06/10/18   Oneal Grout, MD Internal Medicine [facility] [address] [place], [place]  [address] Cell Phone (Monday-Friday 8 am - 5 pm): [phone] On Call: [phone] and follow prompts after 5 pm and on weekends Office Phone: [phone] Office Fax: [phone]

## 2018-06-10 LAB — LIPID PANEL
CHOLESTEROL: 90 (ref 0–200)
HDL: 33 — AB (ref 35–70)
LDL Cholesterol: 37
Triglycerides: 116 (ref 40–160)

## 2018-06-10 LAB — CBC AND DIFFERENTIAL
HCT: 42 (ref 41–53)
Hemoglobin: 13.8 (ref 13.5–17.5)
PLATELETS: 210 (ref 150–399)
WBC: 6.7

## 2018-06-10 LAB — BASIC METABOLIC PANEL
BUN: 14 (ref 4–21)
CREATININE: 0.8 (ref <=1.3)
Glucose: 109
Potassium: 4.1 (ref 3.4–5.3)
Sodium: 140 (ref 137–147)

## 2018-06-10 LAB — HEMOGLOBIN A1C: HEMOGLOBIN A1C: 6.3

## 2018-06-10 LAB — HEPATIC FUNCTION PANEL
ALK PHOS: 89 (ref 25–125)
ALT: 18 (ref 10–40)
AST: 12 — AB (ref 14–40)
Bilirubin, Total: 0.9

## 2018-06-11 ENCOUNTER — Other Ambulatory Visit: Payer: Self-pay | Admitting: *Deleted

## 2018-06-11 LAB — COMPLETE METABOLIC PANEL WITH GFR
ALBUMIN: 4.1
Calcium: 9.1
Carbon Dioxide, Total: 28
Chloride: 106
EGFR (Non-African Amer.): 94
GLOBULIN: 2.2
Total Protein: 6.3 g/dL

## 2018-08-03 ENCOUNTER — Emergency Department (HOSPITAL_COMMUNITY): Payer: BLUE CROSS/BLUE SHIELD

## 2018-08-03 ENCOUNTER — Inpatient Hospital Stay (HOSPITAL_COMMUNITY)
Admission: EM | Admit: 2018-08-03 | Discharge: 2018-08-07 | DRG: 948 | Disposition: A | Discharge status: Skilled Nursing Facility | Payer: BLUE CROSS/BLUE SHIELD | Attending: Internal Medicine | Admitting: Internal Medicine

## 2018-08-03 DIAGNOSIS — G20 Parkinson's disease: Secondary | ICD-10-CM | POA: Diagnosis present

## 2018-08-03 DIAGNOSIS — R42 Dizziness and giddiness: Secondary | ICD-10-CM | POA: Diagnosis not present

## 2018-08-03 DIAGNOSIS — R1310 Dysphagia, unspecified: Secondary | ICD-10-CM | POA: Diagnosis present

## 2018-08-03 DIAGNOSIS — E119 Type 2 diabetes mellitus without complications: Secondary | ICD-10-CM | POA: Diagnosis present

## 2018-08-03 DIAGNOSIS — Z7982 Long term (current) use of aspirin: Secondary | ICD-10-CM

## 2018-08-03 DIAGNOSIS — W19XXXA Unspecified fall, initial encounter: Secondary | ICD-10-CM | POA: Diagnosis not present

## 2018-08-03 DIAGNOSIS — E7800 Pure hypercholesterolemia, unspecified: Secondary | ICD-10-CM | POA: Diagnosis present

## 2018-08-03 DIAGNOSIS — I214 Non-ST elevation (NSTEMI) myocardial infarction: Secondary | ICD-10-CM

## 2018-08-03 DIAGNOSIS — F985 Adult onset fluency disorder: Secondary | ICD-10-CM | POA: Diagnosis present

## 2018-08-03 DIAGNOSIS — R296 Repeated falls: Secondary | ICD-10-CM | POA: Diagnosis present

## 2018-08-03 DIAGNOSIS — Z66 Do not resuscitate: Secondary | ICD-10-CM | POA: Diagnosis present

## 2018-08-03 DIAGNOSIS — J9811 Atelectasis: Secondary | ICD-10-CM | POA: Diagnosis present

## 2018-08-03 DIAGNOSIS — Z8249 Family history of ischemic heart disease and other diseases of the circulatory system: Secondary | ICD-10-CM

## 2018-08-03 DIAGNOSIS — E785 Hyperlipidemia, unspecified: Secondary | ICD-10-CM | POA: Diagnosis present

## 2018-08-03 DIAGNOSIS — I2510 Atherosclerotic heart disease of native coronary artery without angina pectoris: Secondary | ICD-10-CM | POA: Diagnosis present

## 2018-08-03 DIAGNOSIS — R778 Other specified abnormalities of plasma proteins: Secondary | ICD-10-CM | POA: Diagnosis present

## 2018-08-03 DIAGNOSIS — Z955 Presence of coronary angioplasty implant and graft: Secondary | ICD-10-CM

## 2018-08-03 DIAGNOSIS — N390 Urinary tract infection, site not specified: Secondary | ICD-10-CM | POA: Diagnosis present

## 2018-08-03 DIAGNOSIS — M25561 Pain in right knee: Secondary | ICD-10-CM | POA: Diagnosis present

## 2018-08-03 DIAGNOSIS — R4189 Other symptoms and signs involving cognitive functions and awareness: Secondary | ICD-10-CM | POA: Diagnosis not present

## 2018-08-03 DIAGNOSIS — R55 Syncope and collapse: Secondary | ICD-10-CM

## 2018-08-03 DIAGNOSIS — I10 Essential (primary) hypertension: Secondary | ICD-10-CM | POA: Diagnosis present

## 2018-08-03 DIAGNOSIS — M25562 Pain in left knee: Secondary | ICD-10-CM | POA: Diagnosis present

## 2018-08-03 DIAGNOSIS — R269 Unspecified abnormalities of gait and mobility: Secondary | ICD-10-CM | POA: Diagnosis present

## 2018-08-03 DIAGNOSIS — Z79899 Other long term (current) drug therapy: Secondary | ICD-10-CM

## 2018-08-03 DIAGNOSIS — R7989 Other specified abnormal findings of blood chemistry: Secondary | ICD-10-CM | POA: Diagnosis not present

## 2018-08-03 DIAGNOSIS — R2681 Unsteadiness on feet: Secondary | ICD-10-CM

## 2018-08-03 DIAGNOSIS — Z23 Encounter for immunization: Secondary | ICD-10-CM

## 2018-08-03 DIAGNOSIS — Z9181 History of falling: Secondary | ICD-10-CM

## 2018-08-03 DIAGNOSIS — R011 Cardiac murmur, unspecified: Secondary | ICD-10-CM | POA: Diagnosis present

## 2018-08-03 DIAGNOSIS — F329 Major depressive disorder, single episode, unspecified: Secondary | ICD-10-CM | POA: Diagnosis present

## 2018-08-03 DIAGNOSIS — D72829 Elevated white blood cell count, unspecified: Secondary | ICD-10-CM

## 2018-08-03 LAB — BASIC METABOLIC PANEL
Anion gap: 8 (ref 5–15)
BUN: 18 mg/dL (ref 8–23)
CALCIUM: 9 mg/dL (ref 8.9–10.3)
CHLORIDE: 106 mmol/L (ref 98–111)
CO2: 20 mmol/L — AB (ref 22–32)
CREATININE: 1.07 mg/dL (ref 0.61–1.24)
GFR calc non Af Amer: >60 mL/min (ref >60)
GLUCOSE: 159 mg/dL — AB (ref 70–99)
Potassium: 3.6 mmol/L (ref 3.5–5.1)
Sodium: 134 mmol/L — ABNORMAL LOW (ref 135–145)

## 2018-08-03 LAB — CBC
HCT: 46.6 % (ref 39.0–52.0)
Hemoglobin: 14.1 g/dL (ref 13.0–17.0)
MCH: 28 pg (ref 26.0–34.0)
MCHC: 30.3 g/dL (ref 30.0–36.0)
MCV: 92.6 fL (ref 80.0–100.0)
NRBC: 0 % (ref 0.0–0.2)
PLATELETS: 275 10*3/uL (ref 150–400)
RBC: 5.03 MIL/uL (ref 4.22–5.81)
RDW: 13.3 % (ref 11.5–15.5)
WBC: 18 10*3/uL — ABNORMAL HIGH (ref 4.0–10.5)

## 2018-08-03 LAB — CBG MONITORING, ED: Glucose-Capillary: 139 mg/dL — ABNORMAL HIGH (ref 70–99)

## 2018-08-03 LAB — I-STAT TROPONIN, ED: Troponin i, poc: 0.1 ng/mL (ref 0.00–0.08)

## 2018-08-03 MED ORDER — ONDANSETRON HCL 4 MG/2ML IJ SOLN: 4.0000 mg | Freq: Four times a day (QID) | INTRAMUSCULAR | Status: DC | PRN

## 2018-08-03 MED ORDER — DONEPEZIL HCL 10 MG PO TABS
10.0000 mg | ORAL_TABLET | Freq: Every day | ORAL | Status: DC
  Administered 2018-08-04 – 2018-08-06 (×4): 10 mg via ORAL
  Filled 2018-08-03 (×4): qty 1

## 2018-08-03 MED ORDER — ROPINIROLE HCL 0.25 MG PO TABS
0.2500 mg | ORAL_TABLET | Freq: Every day | ORAL | Status: DC
  Administered 2018-08-04 – 2018-08-07 (×4): 0.25 mg via ORAL
  Filled 2018-08-03 (×4): qty 1

## 2018-08-03 MED ORDER — ENOXAPARIN SODIUM 40 MG/0.4ML ~~LOC~~ SOLN
40.0000 mg | Freq: Every day | SUBCUTANEOUS | Status: DC
  Administered 2018-08-04: 40 mg via SUBCUTANEOUS
  Filled 2018-08-03: qty 0.4

## 2018-08-03 MED ORDER — ACETAMINOPHEN 325 MG PO TABS: 650.0000 mg | ORAL_TABLET | ORAL | Status: DC | PRN

## 2018-08-03 MED ORDER — ATORVASTATIN CALCIUM 40 MG PO TABS
40.0000 mg | ORAL_TABLET | Freq: Every day | ORAL | Status: DC
  Administered 2018-08-04 – 2018-08-06 (×4): 40 mg via ORAL
  Filled 2018-08-03 (×4): qty 1

## 2018-08-03 MED ORDER — SENNOSIDES-DOCUSATE SODIUM 8.6-50 MG PO TABS
1.0000 | ORAL_TABLET | ORAL | Status: DC
  Administered 2018-08-04 – 2018-08-06 (×2): 1 via ORAL
  Filled 2018-08-03 (×3): qty 1

## 2018-08-03 MED ORDER — ZOLPIDEM TARTRATE 5 MG PO TABS: 5.0000 mg | ORAL_TABLET | Freq: Every evening | ORAL | Status: DC | PRN

## 2018-08-03 MED ORDER — SODIUM CHLORIDE 0.9 % IV BOLUS
500.0000 mL | Freq: Once | INTRAVENOUS | Status: AC
  Administered 2018-08-03: 500 mL via INTRAVENOUS

## 2018-08-03 MED ORDER — METOPROLOL SUCCINATE ER 100 MG PO TB24
100.0000 mg | ORAL_TABLET | Freq: Every day | ORAL | Status: DC
  Administered 2018-08-04 – 2018-08-07 (×4): 100 mg via ORAL
  Filled 2018-08-03 (×4): qty 1

## 2018-08-03 MED ORDER — MORPHINE SULFATE (PF) 2 MG/ML IV SOLN: 2.0000 mg | INTRAVENOUS | Status: DC | PRN

## 2018-08-03 MED ORDER — ASPIRIN 81 MG PO CHEW
243.0000 mg | CHEWABLE_TABLET | Freq: Once | ORAL | Status: AC
  Administered 2018-08-03: 243 mg via ORAL
  Filled 2018-08-03: qty 3

## 2018-08-03 MED ORDER — CARBIDOPA-LEVODOPA 25-100 MG PO TABS
1.5000 | ORAL_TABLET | Freq: Four times a day (QID) | ORAL | Status: DC
  Administered 2018-08-04 – 2018-08-07 (×15): 1.5 via ORAL
  Filled 2018-08-03 (×15): qty 2

## 2018-08-03 MED ORDER — CHLORHEXIDINE GLUCONATE 0.12 % MT SOLN
5.0000 mL | Freq: Every day | OROMUCOSAL | Status: DC
  Administered 2018-08-04 – 2018-08-06 (×4): 5 mL via OROMUCOSAL
  Filled 2018-08-03 (×4): qty 15

## 2018-08-03 MED ORDER — SODIUM CHLORIDE 0.9 % IV SOLN
INTRAVENOUS | Status: DC
  Administered 2018-08-04: 03:00:00 via INTRAVENOUS

## 2018-08-03 MED ORDER — ASPIRIN 81 MG PO CHEW
243.0000 mg | CHEWABLE_TABLET | Freq: Every day | ORAL | Status: DC
  Administered 2018-08-04 – 2018-08-05 (×2): 243 mg via ORAL
  Filled 2018-08-03 (×2): qty 3

## 2018-08-03 MED ORDER — NITROGLYCERIN 0.4 MG SL SUBL: 0.4000 mg | SUBLINGUAL_TABLET | SUBLINGUAL | Status: DC | PRN

## 2018-08-03 MED ORDER — HYDRALAZINE HCL 20 MG/ML IJ SOLN: 5.0000 mg | INTRAMUSCULAR | Status: DC | PRN

## 2018-08-03 MED ORDER — LISINOPRIL 10 MG PO TABS
10.0000 mg | ORAL_TABLET | Freq: Every day | ORAL | Status: DC
  Administered 2018-08-04 – 2018-08-07 (×4): 10 mg via ORAL
  Filled 2018-08-03 (×4): qty 1

## 2018-08-03 NOTE — ED Provider Notes (Signed)
Emergency Department Provider Note   I have reviewed the triage vital signs and the nursing notes.   HISTORY  Chief Complaint Fall   HPI Alan Wells is a 62 y.o. male with PMH of CAD, HTN, HLD, and Parkinson's disease at [facility] presents to the ED after fall today. Patient reports 3 falls in the last 1-2 weeks. No head injury. Today he reports feeling lightheaded and dropping to his knees. No FOOSH. No AMS. He was diaphoretic according to staff. He reports bilateral knee pain "since the 9th grade" but nothing acute. No CP, SOB, palpitations, or other symptoms. No changes to medication. No symptoms at this time.    Past Medical History:  Diagnosis Date  . Adult onset fluency disorder   . Coronary artery disease 2016   s/p stent left anterior descending artery   . Diabetes mellitus type 2, controlled (HCC) 01/18/2016  . Dysphagia 2018  . Gait instability 2018  . History of falling   . Hypercholesterolemia   . Hypertension   . Memory change 2018  . Parkinson's disease (HCC) 2018  . Weight loss 2017   intentional    Patient Active Problem List   Diagnosis Date Noted  . Elevated troponin 08/03/2018  . Leukocytosis 08/03/2018  . Parkinson's disease (HCC) 11/29/2017  . Constipation 06/03/2017  . Fall 06/03/2017  . Obesity (BMI 30-39.9) 05/10/2017  . Insomnia 03/19/2017  . NSTEMI (non-ST elevated myocardial infarction) (HCC)   . Hypertension   . Hypercholesterolemia   . 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 Cognitive impairment 09/17/2016  . Unsteady gait 09/17/2016  . Dysphagia 09/17/2016  . Diabetes mellitus type 2, controlled (HCC) 01/18/2016  . Coronary artery disease 09/17/2014    Past Surgical History:  Procedure Laterality Date  . CAROTID STENT  2012   in left anterior descending artery past EF 55%  . nuclear stress test  06/17/2015   Gated SPECT images reveal normal LV systolic function. EF 55%. Comparing the rest & stress SPECT  images, no ischemia. Gated images are normal. Dr. Vernell Barrier, MD [place], [place]     Allergies Codeine  Family History  Problem Relation Age of Onset  . Cancer Mother   . Heart disease Mother     Social History Social History   Tobacco Use  . Smoking status: Never Smoker  . Smokeless tobacco: Never Used  Substance Use Topics  . Alcohol use: No  . Drug use: No    Review of Systems  Constitutional: No fever/chills. Positive fall.  Eyes: No visual changes. ENT: No sore throat. Cardiovascular: Denies chest pain. Positive near syncope.  Respiratory: Denies shortness of breath. Gastrointestinal: No abdominal pain.  No nausea, no vomiting.  No diarrhea.  No constipation. Genitourinary: Negative for dysuria. Musculoskeletal: Negative for back pain. Positive bilateral chronic knee pain.  Skin: Negative for rash. Neurological: Negative for headaches, focal weakness or numbness.  10-point ROS otherwise negative.  ____________________________________________   PHYSICAL EXAM:  VITAL SIGNS: ED Triage Vitals  Enc Vitals Group     BP 08/03/18 2030 127/66     Pulse Rate 08/03/18 2030 83     Resp 08/03/18 2030 15     Temp 08/03/18 2032 98.6 F (37 C)     Temp Source 08/03/18 2032 Oral     SpO2 08/03/18 2027 91 %   Constitutional: Alert and oriented. Well appearing and in no acute distress. Eyes: Conjunctivae are normal. PERRL.  Head: Atraumatic. Nose: No congestion/rhinnorhea. Mouth/Throat: Mucous membranes  are moist.  Neck: No stridor. No cervical spine tenderness to palpation. Cardiovascular: Normal rate, regular rhythm. Good peripheral circulation. Grossly normal heart sounds.   Respiratory: Normal respiratory effort.  No retractions. Lungs CTAB. Gastrointestinal: Soft and nontender. No distention.  Musculoskeletal: No lower extremity tenderness nor edema. No gross deformities of extremities. Neurologic: Patient with very mild intention tremor. No pronator drift.  Normal CN exam 2-12. Normal strength and sensation in the upper and lower extremities.  Skin:  Skin is warm, dry and intact. No rash noted.  ____________________________________________   LABS (all labs ordered are listed, but only abnormal results are displayed)  Labs Reviewed  BASIC METABOLIC PANEL - Abnormal; Notable for the following components:      Result Value   Sodium 134 (*)    CO2 20 (*)    Glucose, Bld 159 (*)    All other components within normal limits  CBC - Abnormal; Notable for the following components:   WBC 18.0 (*)    All other components within normal limits  URINALYSIS, ROUTINE W REFLEX MICROSCOPIC - Abnormal; Notable for the following components:   APPearance HAZY (*)    Hgb urine dipstick LARGE (*)    Ketones, ur 20 (*)    Protein, ur 100 (*)    Leukocytes, UA LARGE (*)    Bacteria, UA RARE (*)    All other components within normal limits  HEMOGLOBIN A1C - Abnormal; Notable for the following components:   Hgb A1c MFr Bld 6.3 (*)    All other components within normal limits  LIPID PANEL - Abnormal; Notable for the following components:   HDL 31 (*)    All other components within normal limits  TROPONIN I - Abnormal; Notable for the following components:   Troponin I 0.26 (*)    All other components within normal limits  GLUCOSE, CAPILLARY - Abnormal; Notable for the following components:   Glucose-Capillary 107 (*)    All other components within normal limits  CBG MONITORING, ED - Abnormal; Notable for the following components:   Glucose-Capillary 139 (*)    All other components within normal limits  I-STAT TROPONIN, ED - Abnormal; Notable for the following components:   Troponin i, poc 0.10 (*)    All other components within normal limits  URINE CULTURE  CULTURE, BLOOD (ROUTINE X 2)  CULTURE, BLOOD (ROUTINE X 2)  TROPONIN I  HIV ANTIBODY (ROUTINE TESTING W REFLEX)  HEPARIN LEVEL (UNFRACTIONATED)    ____________________________________________  EKG   EKG Interpretation  Date/Time:  Sunday August 03 2018 20:28:57 EST Ventricular Rate:  87 PR Interval:    QRS Duration: 95 QT Interval:  374 QTC Calculation: 450 R Axis:   25 Text Interpretation:  Sinus rhythm Probable left atrial enlargement Abnormal R-wave progression, early transition No STEMI.  Confirmed by Alona Bene [phone]) on 08/03/2018 9:44:33 PM Also confirmed by Alona Bene [phone]), editor Elita Quick [phone])  on 08/04/2018 7:01:15 AM       ____________________________________________  RADIOLOGY  Dg Chest 2 View  Result Date: 08/03/2018 CLINICAL DATA:  Weakness EXAM: CHEST - 2 VIEW COMPARISON:  None FINDINGS: Heart is normal size. Left base atelectasis. Right lung clear. No effusions. Heart is normal size. No acute bony abnormality. IMPRESSION: Left basilar atelectasis. Electronically Signed   By: Charlett Nose M.D.   On: 08/03/2018 21:15   Ct Head Wo Contrast  Result Date: 08/03/2018 CLINICAL DATA:  Fall. EXAM: CT HEAD WITHOUT CONTRAST TECHNIQUE: Contiguous axial images were obtained  from the base of the skull through the vertex without intravenous contrast. COMPARISON:  May 31, 2017 FINDINGS: Brain: No subdural, epidural, or subarachnoid hemorrhage. Ventricles and sulci are unremarkable. Cerebellum, brainstem, and basal cisterns are normal. No mass effect or midline shift. No acute cortical ischemia or infarct. Vascular: Calcified atherosclerosis in the intracranial carotids. Skull: Normal. Negative for fracture or focal lesion. Sinuses/Orbits: Mucosal thickening in the maxillary sinuses, right greater than left. No air-fluid levels. Paranasal sinuses, mastoid air cells, and middle ears are otherwise unremarkable. Other: None. IMPRESSION: 1. No acute intracranial abnormalities noted. Electronically Signed   By: Gerome Samavid  Williams III M.D   On: 08/03/2018 21:45     ____________________________________________   PROCEDURES  Procedure(s) performed:   Procedures  CRITICAL CARE Performed by: Maia PlanJoshua G Corryn Madewell Total critical care time: 35 minutes Critical care time was exclusive of separately billable procedures and treating other patients. Critical care was necessary to treat or prevent imminent or life-threatening deterioration. Critical care was time spent personally by me on the following activities: development of treatment plan with patient and/or surrogate as well as nursing, discussions with consultants, evaluation of patient's response to treatment, examination of patient, obtaining history from patient or surrogate, ordering and performing treatments and interventions, ordering and review of laboratory studies, ordering and review of radiographic studies, pulse oximetry and re-evaluation of patient's condition.  Alona BeneJoshua Yosgart Pavey, MD Emergency Medicine  ____________________________________________   INITIAL IMPRESSION / ASSESSMENT AND PLAN / ED COURSE  Pertinent labs & imaging results that were available during my care of the patient were reviewed by me and considered in my medical decision making (see chart for details).  Patient presents to the emergency department for evaluation of fall.  He fell to his knees and has had several falls in the last week.  No evidence of head trauma.  No head trauma with this most recent fall but unclear regarding the other episodes.  Patient did feel lightheaded and had some diaphoresis but denies chest pain or heart palpitations.  Given his lightheadedness and diaphoresis I do plan for lab evaluation including troponin and EKG.  Given his multiple recent falls plan for CT imaging of the head to rule out subacute subdural.  No focal neurological deficits to suspect stroke.   Patient with mild elevated troponin. No active chest pain. EKG with no acute ischemic findings. CXR reviewed with atelectasis noted. Patient  does have elevated WBC count but no other infection symptoms. No abx for now. With elevated troponin, my suspicion for cardiogenic etiology for near syncope. Plan for obs admit.   Discussed patient's case with Hospitalist, Dr. Clyde LundborgNiu to request admission. Patient and family (if present) updated with plan. Care transferred to Hospitalist service.  I reviewed all nursing notes, vitals, pertinent old records, EKGs, labs, imaging (as available).  ____________________________________________  FINAL CLINICAL IMPRESSION(S) / ED DIAGNOSES  Final diagnoses:  Near syncope  Fall, initial encounter  Elevated troponin     MEDICATIONS GIVEN DURING THIS VISIT:  Medications  nitroGLYCERIN (NITROSTAT) SL tablet 0.4 mg (has no administration in time range)  morphine 2 MG/ML injection 2 mg (has no administration in time range)  aspirin chewable tablet 243 mg (has no administration in time range)  atorvastatin (LIPITOR) tablet 40 mg (40 mg Oral Given 08/04/18 0211)  lisinopril (PRINIVIL,ZESTRIL) tablet 10 mg (has no administration in time range)  metoprolol succinate (TOPROL-XL) 24 hr tablet 100 mg (has no administration in time range)  donepezil (ARICEPT) tablet 10 mg (10 mg Oral Given  08/04/18 0211)  senna-docusate (Senokot-S) tablet 1 tablet (has no administration in time range)  carbidopa-levodopa (SINEMET IR) 25-100 MG per tablet immediate release 1.5 tablet (1.5 tablets Oral Given 08/04/18 0840)  rOPINIRole (REQUIP) tablet 0.25 mg (has no administration in time range)  chlorhexidine (PERIDEX) 0.12 % solution 5 mL (5 mLs Mouth/Throat Given 08/04/18 0211)  acetaminophen (TYLENOL) tablet 650 mg (has no administration in time range)  ondansetron (ZOFRAN) injection 4 mg (has no administration in time range)  zolpidem (AMBIEN) tablet 5 mg (has no administration in time range)  hydrALAZINE (APRESOLINE) injection 5 mg (has no administration in time range)  0.9 %  sodium chloride infusion ( Intravenous New  Bag/Given 08/04/18 0316)  pneumococcal 23 valent vaccine (PNU-IMMUNE) injection 0.5 mL (has no administration in time range)  heparin ADULT infusion 100 units/mL (25000 units/214mL sodium chloride 0.45%) (1,200 Units/hr Intravenous New Bag/Given 08/04/18 0317)  cefTRIAXone (ROCEPHIN) 1 g in sodium chloride 0.9 % 100 mL IVPB (has no administration in time range)  sodium chloride 0.9 % bolus 500 mL (0 mLs Intravenous Stopped 08/03/18 2145)  aspirin chewable tablet 243 mg (243 mg Oral Given 08/03/18 2206)  heparin bolus via infusion 4,000 Units (4,000 Units Intravenous Bolus from Bag 08/04/18 0318)     Note:  This document was prepared using Dragon voice recognition software and may include unintentional dictation errors.  Alona Bene, MD Emergency Medicine    Eugenia Eldredge, Arlyss Repress, MD 08/04/18 [phone]

## 2018-08-03 NOTE — ED Notes (Signed)
Delay in lab draw,  RN helping pt void.

## 2018-08-03 NOTE — ED Triage Notes (Signed)
Patient arrived via ems from [facility]. Staff stated he fell around 1930, landed on his knees and did not hit his head. Has had increased falls in the past few weeks, however he is usually unsteady on his feet according to staff. Hx of parkisons-aox4.

## 2018-08-03 NOTE — ED Notes (Signed)
Dr Jacqulyn BathLong informed of troponin results .10

## 2018-08-03 NOTE — ED Notes (Signed)
Patient transported to X-ray 

## 2018-08-04 ENCOUNTER — Other Ambulatory Visit: Payer: Self-pay

## 2018-08-04 ENCOUNTER — Encounter (HOSPITAL_COMMUNITY): Payer: Self-pay | Admitting: General Practice

## 2018-08-04 DIAGNOSIS — W19XXXA Unspecified fall, initial encounter: Secondary | ICD-10-CM | POA: Diagnosis present

## 2018-08-04 DIAGNOSIS — Z23 Encounter for immunization: Secondary | ICD-10-CM | POA: Diagnosis not present

## 2018-08-04 DIAGNOSIS — M25561 Pain in right knee: Secondary | ICD-10-CM | POA: Diagnosis present

## 2018-08-04 DIAGNOSIS — R011 Cardiac murmur, unspecified: Secondary | ICD-10-CM | POA: Diagnosis present

## 2018-08-04 DIAGNOSIS — I2510 Atherosclerotic heart disease of native coronary artery without angina pectoris: Secondary | ICD-10-CM

## 2018-08-04 DIAGNOSIS — N390 Urinary tract infection, site not specified: Secondary | ICD-10-CM | POA: Diagnosis present

## 2018-08-04 DIAGNOSIS — Z955 Presence of coronary angioplasty implant and graft: Secondary | ICD-10-CM | POA: Diagnosis not present

## 2018-08-04 DIAGNOSIS — Z8249 Family history of ischemic heart disease and other diseases of the circulatory system: Secondary | ICD-10-CM | POA: Diagnosis not present

## 2018-08-04 DIAGNOSIS — M25562 Pain in left knee: Secondary | ICD-10-CM | POA: Diagnosis present

## 2018-08-04 DIAGNOSIS — R4189 Other symptoms and signs involving cognitive functions and awareness: Secondary | ICD-10-CM | POA: Diagnosis present

## 2018-08-04 DIAGNOSIS — R7989 Other specified abnormal findings of blood chemistry: Secondary | ICD-10-CM | POA: Diagnosis present

## 2018-08-04 DIAGNOSIS — R55 Syncope and collapse: Secondary | ICD-10-CM | POA: Diagnosis present

## 2018-08-04 DIAGNOSIS — R296 Repeated falls: Secondary | ICD-10-CM | POA: Diagnosis present

## 2018-08-04 DIAGNOSIS — E119 Type 2 diabetes mellitus without complications: Secondary | ICD-10-CM | POA: Diagnosis present

## 2018-08-04 DIAGNOSIS — J9811 Atelectasis: Secondary | ICD-10-CM | POA: Diagnosis present

## 2018-08-04 DIAGNOSIS — F985 Adult onset fluency disorder: Secondary | ICD-10-CM | POA: Diagnosis present

## 2018-08-04 DIAGNOSIS — E785 Hyperlipidemia, unspecified: Secondary | ICD-10-CM | POA: Diagnosis present

## 2018-08-04 DIAGNOSIS — E7800 Pure hypercholesterolemia, unspecified: Secondary | ICD-10-CM | POA: Diagnosis present

## 2018-08-04 DIAGNOSIS — F329 Major depressive disorder, single episode, unspecified: Secondary | ICD-10-CM | POA: Diagnosis present

## 2018-08-04 DIAGNOSIS — R1310 Dysphagia, unspecified: Secondary | ICD-10-CM | POA: Diagnosis present

## 2018-08-04 DIAGNOSIS — Z79899 Other long term (current) drug therapy: Secondary | ICD-10-CM | POA: Diagnosis not present

## 2018-08-04 DIAGNOSIS — R42 Dizziness and giddiness: Secondary | ICD-10-CM | POA: Diagnosis present

## 2018-08-04 DIAGNOSIS — I10 Essential (primary) hypertension: Secondary | ICD-10-CM | POA: Diagnosis present

## 2018-08-04 DIAGNOSIS — R269 Unspecified abnormalities of gait and mobility: Secondary | ICD-10-CM | POA: Diagnosis present

## 2018-08-04 DIAGNOSIS — G20 Parkinson's disease: Secondary | ICD-10-CM | POA: Diagnosis present

## 2018-08-04 DIAGNOSIS — Z9181 History of falling: Secondary | ICD-10-CM | POA: Diagnosis not present

## 2018-08-04 LAB — URINALYSIS, ROUTINE W REFLEX MICROSCOPIC
BILIRUBIN URINE: NEGATIVE
Glucose, UA: NEGATIVE mg/dL
Ketones, ur: 20 mg/dL — AB
Nitrite: NEGATIVE
PH: 5 (ref 5.0–8.0)
Protein, ur: 100 mg/dL — AB
SPECIFIC GRAVITY, URINE: 1.026 (ref 1.005–1.030)

## 2018-08-04 LAB — LIPID PANEL
Cholesterol: 95 mg/dL (ref 0–200)
HDL: 31 mg/dL — ABNORMAL LOW (ref >40)
LDL Cholesterol: 47 mg/dL (ref 0–99)
TRIGLYCERIDES: 84 mg/dL (ref <150)
Total CHOL/HDL Ratio: 3.1 RATIO
VLDL: 17 mg/dL (ref 0–40)

## 2018-08-04 LAB — HEPARIN LEVEL (UNFRACTIONATED): Heparin Unfractionated: 0.48 IU/mL (ref 0.30–0.70)

## 2018-08-04 LAB — TROPONIN I
TROPONIN I: 0.26 ng/mL — AB (ref <0.03)
Troponin I: 0.26 ng/mL (ref <0.03)

## 2018-08-04 LAB — GLUCOSE, CAPILLARY: Glucose-Capillary: 107 mg/dL — ABNORMAL HIGH (ref 70–99)

## 2018-08-04 LAB — HEMOGLOBIN A1C
HEMOGLOBIN A1C: 6.3 % — AB (ref 4.8–5.6)
Mean Plasma Glucose: 134.11 mg/dL

## 2018-08-04 LAB — HIV ANTIBODY (ROUTINE TESTING W REFLEX): HIV SCREEN 4TH GENERATION: NONREACTIVE

## 2018-08-04 MED ORDER — CIPROFLOXACIN HCL 500 MG PO TABS: 500.0000 mg | ORAL_TABLET | Freq: Two times a day (BID) | ORAL | 0 refills | Status: DC

## 2018-08-04 MED ORDER — SODIUM CHLORIDE 0.9 % IV SOLN
1.0000 g | INTRAVENOUS | Status: DC
  Filled 2018-08-04: qty 10

## 2018-08-04 MED ORDER — HEPARIN (PORCINE) 25000 UT/250ML-% IV SOLN
1200.0000 [IU]/h | INTRAVENOUS | Status: DC
  Administered 2018-08-04: 1200 [IU]/h via INTRAVENOUS
  Filled 2018-08-04: qty 250

## 2018-08-04 MED ORDER — SODIUM CHLORIDE 0.9 % IV SOLN
1.0000 g | INTRAVENOUS | Status: DC
  Administered 2018-08-04 – 2018-08-05 (×2): 1 g via INTRAVENOUS
  Filled 2018-08-04 (×2): qty 10

## 2018-08-04 MED ORDER — PNEUMOCOCCAL VAC POLYVALENT 25 MCG/0.5ML IJ INJ
0.5000 mL | INJECTION | INTRAMUSCULAR | Status: AC
  Administered 2018-08-05: 0.5 mL via INTRAMUSCULAR
  Filled 2018-08-04: qty 0.5

## 2018-08-04 MED ORDER — HEPARIN BOLUS VIA INFUSION
4000.0000 [IU] | Freq: Once | INTRAVENOUS | Status: AC
  Administered 2018-08-04: 4000 [IU] via INTRAVENOUS
  Filled 2018-08-04: qty 4000

## 2018-08-04 MED ORDER — ENOXAPARIN SODIUM 40 MG/0.4ML ~~LOC~~ SOLN
40.0000 mg | SUBCUTANEOUS | Status: DC
  Administered 2018-08-04 – 2018-08-07 (×4): 40 mg via SUBCUTANEOUS
  Filled 2018-08-04 (×4): qty 0.4

## 2018-08-04 NOTE — Progress Notes (Signed)
OT Cancellation Note  Patient Details Name: Alan BailiffHarry Wells MRN: [id number] DOB: 1955-09-19   Cancelled Treatment:    Reason Eval/Treat Not Completed: Medical issues which prohibited therapy(Troponin 0.26 and trending up. Will return as schedule allows. Thank you.)  Shreyan Hinz M Ileana Chalupa Shad Ledvina MSOT, OTR/L Acute Rehab Pager: [phone] Office: [phone] 08/04/2018, 7:58 AM

## 2018-08-04 NOTE — Progress Notes (Signed)
Patient pulled off both of his IV peripheral when attempting to stand up. Dressing and pressure was applied.  IV catheters intact upon inspection.  Patient has attempted to get up by himself multiple times.

## 2018-08-04 NOTE — Evaluation (Signed)
Physical Therapy Evaluation Patient Details Name: Alan Wells MRN: [id number] DOB: 28-Nov-1955 Today's Date: 08/04/2018   History of Present Illness  Pt is a 62 y.o. male who was admitted after a fall at assisted living facility. PMH includes Parkinson's, cognitive impairment, HTN, light headedness, CAD, DM. CT scan revealed no abnormalities. CXR showed left basilar atelectasis. No c/o of LOC.   Clinical Impression  Patient presents with generalized weakness, deconditioning, impaired balance, cognitive impairment (at baseline) and impaired mobility s/p above. Tolerated transfers and gait training with Min-Max A for balance/safety. Pt with increased difficulty sitting EOB due to retropulsion and standing from low surface. Pt from [facility] and requires assist for showering but normally able to ambulate to dining hall without difficulty. Does report multiple falls at home. High fall risk. Would benefit from SNF to maximize independence and mobility prior to return home. Will follow acutely.    Follow Up Recommendations SNF;Supervision/Assistance - 24 hour    Equipment Recommendations  None recommended by PT    Recommendations for Other Services       Precautions / Restrictions Precautions Precautions: Fall Restrictions Weight Bearing Restrictions: No      Mobility  Bed Mobility Overal bed mobility: Needs Assistance Bed Mobility: Supine to Sit     Supine to sit: Min assist;HOB elevated     General bed mobility comments: Assist to elevate trunk to get to EOB, retropulsion noted.  Transfers Overall transfer level: Needs assistance Equipment used: Rolling walker (2 wheeled) Transfers: Sit to/from Stand Sit to Stand: Mod assist;From elevated surface         General transfer comment: Assist to power to standing with use of momentum, multiple attempts to stand. transferred to chair post ambulation.  Ambulation/Gait Ambulation/Gait assistance: Min assist Gait Distance  (Feet): 20 Feet Assistive device: Rolling walker (2 wheeled) Gait Pattern/deviations: Step-through pattern;Decreased stride length;Shuffle;Trunk flexed Gait velocity: unsafe speed   General Gait Details: Slow, shuffling like gait with cues for RW management, Min A for balance. Poor safety awareness.  Stairs            Wheelchair Mobility    Modified Rankin (Stroke Patients Only)       Balance Overall balance assessment: Needs assistance;History of Falls Sitting-balance support: Feet supported;Single extremity supported Sitting balance-Leahy Scale: Poor Sitting balance - Comments: Requires at least UE support static sitting EOB.   Standing balance support: During functional activity;Bilateral upper extremity supported Standing balance-Leahy Scale: Poor Standing balance comment: Reliant on BUEs for support in standing and external support due to posterior bias/lean.                             Pertinent Vitals/Pain Pain Assessment: No/denies pain    Home Living Family/patient expects to be discharged to:: Assisted living               Home Equipment: Walker - 2 wheels Additional Comments: help with showering provided, could not detail equipment available    Prior Function Level of Independence: Needs assistance   Gait / Transfers Assistance Needed: Walked to and from dining hall 2-3 times a day.  ADL's / Homemaking Assistance Needed: Assisted living facility takes care of food, cleaning, help with bathing. He dressed himself.         Hand Dominance        Extremity/Trunk Assessment   Upper Extremity Assessment Upper Extremity Assessment: Defer to OT evaluation    Lower Extremity Assessment  Lower Extremity Assessment: Generalized weakness(rigidity noted throughout BLEs)    Cervical / Trunk Assessment Cervical / Trunk Assessment: Kyphotic  Communication   Communication: No difficulties  Cognition Arousal/Alertness:  Awake/alert Behavior During Therapy: WFL for tasks assessed/performed Overall Cognitive Status: History of cognitive impairments - at baseline                                 General Comments: Cognitive deficit makes accuracy of patient report of PLOF and hx quetionable. AnOx4      General Comments General comments (skin integrity, edema, etc.): Diaphoretic during session but VSS- 133/71 BP post activity.    Exercises     Assessment/Plan    PT Assessment Patient needs continued PT services  PT Problem List Decreased strength;Decreased mobility;Decreased safety awareness;Decreased coordination;Decreased knowledge of precautions;Decreased activity tolerance;Decreased cognition;Cardiopulmonary status limiting activity;Decreased knowledge of use of DME;Decreased balance       PT Treatment Interventions Gait training;DME instruction;Therapeutic activities;Cognitive remediation;Therapeutic exercise;Balance training;Functional mobility training;Neuromuscular re-education    PT Goals (Current goals can be found in the Care Plan section)  Acute Rehab PT Goals Patient Stated Goal: to go home PT Goal Formulation: With patient Time For Goal Achievement: 08/18/18 Potential to Achieve Goals: Fair    Frequency Min 2X/week   Barriers to discharge Decreased caregiver support      Co-evaluation               AM-PAC PT "6 Clicks" Daily Activity  Outcome Measure Difficulty turning over in bed (including adjusting bedclothes, sheets and blankets)?: Unable Difficulty moving from lying on back to sitting on the side of the bed? : Unable Difficulty sitting down on and standing up from a chair with arms (e.g., wheelchair, bedside commode, etc,.)?: Unable Help needed moving to and from a bed to chair (including a wheelchair)?: A Lot Help needed walking in hospital room?: A Little Help needed climbing 3-5 steps with a railing? : A Lot 6 Click Score: 10    End of Session  Equipment Utilized During Treatment: Gait belt Activity Tolerance: Patient tolerated treatment well;Patient limited by lethargy Patient left: in chair;with chair alarm set;with call bell/phone within reach Nurse Communication: Mobility status;Other (comment)(recommended to discharge to SNF instead of return to assisted living) PT Visit Diagnosis: Muscle weakness (generalized) (M62.81);Difficulty in walking, not elsewhere classified (R26.2);Repeated falls (R29.6)    Time: [id number] PT Time Calculation (min) (ACUTE ONLY): 16 min   Charges:   PT Evaluation $PT Eval Moderate Complexity: 1 Mod          Mylo RedShauna Marilee Ditommaso, PT, DPT Acute Rehabilitation Services Pager [phone] Office [phone]      Blake DivineShauna A Lanier EnsignHartshorne 08/04/2018, 2:45 PM

## 2018-08-04 NOTE — H&P (Signed)
History and Physical    [name] NWG:[id number] DOB: 21-Sep-1955 DOA: 08/03/2018  Referring MD/NP/PA:   PCP: Oneal Grout, MD   Patient coming from:  The patient is coming from SNF.  At baseline, pt is dependent for most of ADL.        Chief Complaint: fall  HPI: Alan Wells is a 62 y.o. male with medical history significant of Parkinson's disease, hypertension, hyperlipidemia, diet-controlled diabetes, depression, CAD, stent placement, cognitive impairment, who presents with fall.   Per report, pt has increased fall recently. He has poor balance and lightheadedness.  He has had at least three falls in past week. He does not have a significant injury.  He has cognitive impairment, but he is oriented x3.  He answers all questions appropriately.  He denies loss of consciousness, no head or neck injury.  Denies any pain anywhere.  Patient does not have shortness of breath, chest pain, cough, fever or chills.  Denies nausea, vomiting, diarrhea, abdominal pain, symptoms for UTI.  He denies unilateral tingling or numbness in extremities.  No facial droop or slurred speech.  Actually patient is asymptomatic, no complaints currently.  ED Course: pt was found to have elevated troponin 0.10, electrolytes renal function okay, WBC 18.0, no tachycardia, no tachypnea, oxygen saturation 91 to 98% on room air, temperature normal.  Chest x-ray showed left basilar atelectasis.  CT head is negative for acute intracranial abnormalities.  Patient is placed on telemetry bed of observation.  Review of Systems:   General: no fevers, chills, no body weight gain, no fatigue HEENT: no blurry vision, hearing changes or sore throat Respiratory: no dyspnea, coughing, wheezing CV: no chest pain, no palpitations GI: no nausea, vomiting, abdominal pain, diarrhea, constipation GU: no dysuria, burning on urination, increased urinary frequency, hematuria  Ext: no leg edema Neuro: no unilateral weakness, numbness, or  tingling, no vision change or hearing loss. Has fall, poor balance and lightheadedness. No LOC. Skin: no rash, no skin tear. MSK: No muscle spasm, no deformity, no limitation of range of movement in spin Heme: No easy bruising.  Travel history: No recent long distant travel.  Allergy:  Allergies  Allergen Reactions  . Codeine     Past Medical History:  Diagnosis Date  . Adult onset fluency disorder   . Coronary artery disease 2016   s/p stent left anterior descending artery   . Diabetes mellitus type 2, controlled (HCC) 01/18/2016  . Dysphagia 2018  . Gait instability 2018  . History of falling   . Hypercholesterolemia   . Hypertension   . Memory change 2018  . Parkinson's disease (HCC) 2018  . Weight loss 2017   intentional    Past Surgical History:  Procedure Laterality Date  . CAROTID STENT  2012   in left anterior descending artery past EF 55%  . nuclear stress test  06/17/2015   Gated SPECT images reveal normal LV systolic function. EF 55%. Comparing the rest & stress SPECT images, no ischemia. Gated images are normal. Dr. Vernell Barrier, MD [place], [place]     Social History:  reports that he has never smoked. He has never used smokeless tobacco. He reports that he does not drink alcohol or use drugs.  Family History:  Family History  Problem Relation Age of Onset  . Cancer Mother   . Heart disease Mother      Prior to Admission medications   Medication Sig Start Date End Date Taking? Authorizing Provider  aspirin 81 MG chewable  tablet Chew 81 mg by mouth daily.    Yes [provider]  atorvastatin (LIPITOR) 40 MG tablet Take 40 mg by mouth at bedtime. \   Yes [provider]  carbidopa-levodopa (SINEMET IR) 25-100 MG tablet Take 1.5 tablets by mouth 4 (four) times daily. Take 1 1/2 tablet   Yes [provider]  chlorhexidine (PERIDEX) 0.12 % solution Use as directed 5 mLs in the mouth or throat at bedtime.   Yes [provider]  donepezil (ARICEPT) 10 MG tablet Take 10 mg by mouth at bedtime.   Yes [provider]  lisinopril (PRINIVIL,ZESTRIL) 10 MG tablet Take 10 mg by mouth daily.    Yes [provider]  metoprolol succinate (TOPROL-XL) 100 MG 24 hr tablet Take 100 mg by mouth daily.    Yes [provider]  NON FORMULARY Hemp oil -4 sprays sublingual once a day   Yes [provider]  rOPINIRole (REQUIP) 0.25 MG tablet Take 0.25 mg by mouth daily.    Yes [provider]  sennosides-docusate sodium (SENOKOT-S) 8.6-50 MG tablet Take 1 tablet by mouth every other day.    Yes [provider]    Physical Exam: Vitals:   08/03/18 2045 08/03/18 2100 08/03/18 2130 08/03/18 2145  BP: 128/65 128/65 127/71 (!) 142/67  Pulse: 74 74 70 66  Resp: 19 19 19 20   Temp:      TempSrc:      SpO2: 95% 97% 98% 98%   General: Not in acute distress HEENT:       Eyes: PERRL, EOMI, no scleral icterus.       ENT: No discharge from the ears and nose, no pharynx injection, no tonsillar enlargement.        Neck: No JVD, no bruit, no mass felt. Heme: No neck lymph node enlargement. Cardiac: S1/S2, RRR, No murmurs, No gallops or rubs. Respiratory: No rales, wheezing, rhonchi or rubs. GI: Soft, nondistended, nontender, no rebound pain, no organomegaly, BS present. GU: No hematuria Ext: No pitting leg edema bilaterally. 2+DP/PT pulse bilaterally. Musculoskeletal: No joint deformities, No joint redness or warmth, no limitation of ROM in spin. Skin: No rashes.  Neuro: Alert, oriented X3, cranial nerves II-XII grossly intact, moves all extremities normally. Muscle strength 5/5 in all extremities, sensation to light touch intact. Negative Babinski's sign.  Psych: Patient is not psychotic, no suicidal or hemocidal ideation.  Labs on Admission: I have personally reviewed following labs and imaging studies  CBC: Recent Labs  Lab 08/03/18 2036  WBC 18.0*  HGB 14.1  HCT 46.6  MCV  92.6  PLT 275   Basic Metabolic Panel: Recent Labs  Lab 08/03/18 2036  NA 134*  K 3.6  CL 106  CO2 20*  GLUCOSE 159*  BUN 18  CREATININE 1.07  CALCIUM 9.0   GFR: CrCl cannot be calculated (Unknown ideal weight.). Liver Function Tests: No results for input(s): AST, ALT, ALKPHOS, BILITOT, PROT, ALBUMIN in the last 168 hours. No results for input(s): LIPASE, AMYLASE in the last 168 hours. No results for input(s): AMMONIA in the last 168 hours. Coagulation Profile: No results for input(s): INR, PROTIME in the last 168 hours. Cardiac Enzymes: No results for input(s): CKTOTAL, CKMB, CKMBINDEX, TROPONINI in the last 168 hours. BNP (last 3 results) No results for input(s): PROBNP in the last 8760 hours. HbA1C: No results for input(s): HGBA1C in the last 72 hours. CBG: Recent Labs  Lab 08/03/18 2048  GLUCAP 139*   Lipid  Profile: No results for input(s): CHOL, HDL, LDLCALC, TRIG, CHOLHDL, LDLDIRECT in the last 72 hours. Thyroid Function Tests: No results for input(s): TSH, T4TOTAL, FREET4, T3FREE, THYROIDAB in the last 72 hours. Anemia Panel: No results for input(s): VITAMINB12, FOLATE, FERRITIN, TIBC, IRON, RETICCTPCT in the last 72 hours. Urine analysis: No results found for: COLORURINE, APPEARANCEUR, LABSPEC, PHURINE, GLUCOSEU, HGBUR, BILIRUBINUR, KETONESUR, PROTEINUR, UROBILINOGEN, NITRITE, LEUKOCYTESUR Sepsis Labs: @LABRCNTIP (procalcitonin:4,lacticidven:4) )No results found for this or any previous visit (from the past 240 hour(s)).   Radiological Exams on Admission: Dg Chest 2 View  Result Date: 08/03/2018 CLINICAL DATA:  Weakness EXAM: CHEST - 2 VIEW COMPARISON:  None FINDINGS: Heart is normal size. Left base atelectasis. Right lung clear. No effusions. Heart is normal size. No acute bony abnormality. IMPRESSION: Left basilar atelectasis. Electronically Signed   By: Charlett Nose M.D.   On: 08/03/2018 21:15   Ct Head Wo Contrast  Result Date: 08/03/2018 CLINICAL  DATA:  Fall. EXAM: CT HEAD WITHOUT CONTRAST TECHNIQUE: Contiguous axial images were obtained from the base of the skull through the vertex without intravenous contrast. COMPARISON:  May 31, 2017 FINDINGS: Brain: No subdural, epidural, or subarachnoid hemorrhage. Ventricles and sulci are unremarkable. Cerebellum, brainstem, and basal cisterns are normal. No mass effect or midline shift. No acute cortical ischemia or infarct. Vascular: Calcified atherosclerosis in the intracranial carotids. Skull: Normal. Negative for fracture or focal lesion. Sinuses/Orbits: Mucosal thickening in the maxillary sinuses, right greater than left. No air-fluid levels. Paranasal sinuses, mastoid air cells, and middle ears are otherwise unremarkable. Other: None. IMPRESSION: 1. No acute intracranial abnormalities noted. Electronically Signed   By: Gerome Sam III M.D   On: 08/03/2018 21:45     EKG: Independently reviewed.  Sinus rhythm, T wave inversion only in lead III, minimal ST depression in V4-V5, early R wave progression   Assessment/Plan Principal Problem:   Fall Active Problems:   Cognitive impairment   Hypertension   Hypercholesterolemia   Diabetes mellitus type 2, controlled (HCC)   Coronary artery disease   Parkinson's disease (HCC)   Elevated troponin   Leukocytosis   Falls: No significant injury.  CT head is negative for acute intracranial abnormalities.  No focal neurologic findings on physical examination.  Patient denies loss of consciousness from any fall.  Most likely due to Parkinson's disease.  Patient has leukocytosis with WBC 18.0, but no fever.  No source of infection identified so far, pending urinalysis.  - will place on telemetry bed for observation - check orthostatic vital signs - IV fluid: 500 cc normal saline, then 100 cc/h -PT/OT -Follow-up urinalysis  Elevated troponin and CAD: s/p of stent.  Patient does not have any chest pain or shortness of breath.  No palpitation.   Not sure why troponin is checked. - prn Nitroglycerin, Morphine, and aspirin, lipitor, metoprolol - Risk factor stratification: will check FLP and A1C  - did not order 2d echo--> please reevaluate pt in AM to decide if pt needs 2D echo.  Addendum: trop is trending up 0.1-->0.26. Though pt dose not have CP, he has cognitive impairment, may not be able to tell accurately symptoms. -Will start IV heparin -Cardio consult was requested via epic  HTN:  -Continue home medications: Lisinopril, metoprolol -IV hydralazine prn  Hypercholesterolemia: -lipitor  Cognitive impairment: -Donepezil  Depression: -Requip  Diet-controlled diabetes mellitus type 2, controlled (HCC): Last A1c 6.3 on 06/10/18,well controled. Patient is not taking meds at home. CBG 159. -CBG qAM  Parkinson's disease (HCC): -Sinemet -PT/OT  Leukocytosis: WBC 18.0. No fever. No source of infection identified.  Pending urinalysis. -Urine culture, urinalysis, blood culture   DVT ppx: on IV heparin Code Status: DNR (pt has DNR yellow paper from facility) Family Communication: None at bed side.   Disposition Plan:  Anticipate discharge back to previous SNF Consults called:  none Admission status: Obs / tele  Date of Service 08/03/2018    Lorretta Harp [facility] Pager [phone]  If 7PM-7AM, please contact night-coverage [email or web address] Password [email or web address] 08/03/2018, 11:09 PM

## 2018-08-04 NOTE — Clinical Social Work Note (Signed)
Clinical Social Work Assessment  Patient Details  Name: Alan Wells MRN: [id number] Date of Birth: 10-Jan-1956  Date of referral:  08/04/18               Reason for consult:  Discharge Planning                Permission sought to share information with:  Chartered certified accountant granted to share information::  Yes, Verbal Permission Granted  Name::        Agency::  [facility]  Relationship::     Contact Information:     Housing/Transportation Living arrangements for the past 2 months:  [place] of Information:  Patient, Medical Team, Facility Patient Interpreter Needed:  None Criminal Activity/Legal Involvement Pertinent to Current Situation/Hospitalization:  No - Comment as needed Significant Relationships:  Siblings Lives with:  Facility Resident Do you feel safe going back to the place where you live?  Yes Need for family participation in patient care:  Yes (Comment)  Care giving concerns:  Patient is a resident at [facility]. PT recommending SNF once medically stable for discharge.   Social Worker assessment / plan:  CSW met with patient. No supports at bedside. CSW introduced role and explained that PT recommendations would be discussed. Patient confirmed he is from [facility] and is agreeable to rehab prior to returning. CSW spoke with admissions coordinator this morning and she was unsure if they would have any beds. If not, she was going to check with [facility]. Patient aware and agreeable. CSW has left voicemail for admissions coordinator to notify her that patient's discharge is in and check status of bed availability. PASARR under manual review. Patient cannot go to SNF until PASARR obtained. No further concerns. CSW encouraged patient to contact CSW as needed. He did not want CSW to notify family. CSW will continue to follow patient for support and facilitate discharge to SNF once bed  available.  Employment status:  Retired Nurse, adult PT Recommendations:  [place] / Referral to community resources:  [place]  Patient/Family's Response to care:  Patient agreeable to SNF prior to returning to ALF. Patient's brother supportive and involved in patient's care. Patient appreciated social work intervention.  Patient/Family's Understanding of and Emotional Response to Diagnosis, Current Treatment, and Prognosis:  Patient has a good understanding of the reason for admission and his need for rehab prior to returning to ALF. Patient appears happy with hospital care.  Emotional Assessment Appearance:  Appears stated age Attitude/Demeanor/Rapport:  Engaged, Gracious Affect (typically observed):  Accepting, Appropriate, Calm, Pleasant Orientation:  Oriented to Self, Oriented to Place, Oriented to  Time, Oriented to Situation Alcohol / Substance use:  Never Used Psych involvement (Current and /or in the community):  No (Comment)  Discharge Needs  Concerns to be addressed:  Care Coordination Readmission within the last 30 days:  No Current discharge risk:  Dependent with Mobility Barriers to Discharge:  Awaiting Regulatory affairs officer Tour manager), [place], LCSW 08/04/2018, 3:00 PM

## 2018-08-04 NOTE — Progress Notes (Signed)
[name]. [facility]. Alan Wells, Cheryll DessertKaren Cherrell

## 2018-08-04 NOTE — Clinical Social Work Note (Signed)
[facility] does not have any SNF beds. [facility] is out today so they will follow up with her in the morning. [facility].  Alan CourtSarah Kyarra Wells, CSW [phone]

## 2018-08-04 NOTE — Evaluation (Addendum)
Occupational Therapy Evaluation Patient Details Name: Gavino Fouch MRN: [id number] DOB: [birth year] Today's Date: 08/04/2018    History of Present Illness Pt is a 62 y.o. male who was admitted after a fall at assisted living facility. PMH includes Parkinson's, cognitive impairment, HTN, light headedness, CAD, DM. CT scan revealed no abnormalities. CXR showed left basilar atelectasis. No c/o of LOC.    Clinical Impression   PTA, pt was living at [facility] and was performing BADLs with assistance for bathing and IADLs; used a RW for functional mobility. Pt currently requiring Min A for UB ADLs, Mod A for LB ADLs, and Min-Mod A for functional mobility with RW. Pt presenting with decreased balance and activity tolerance. Pt with single LOB requiring Mod A for balance correction. Pt would benefit from further acute OT to facilitate safe dc. Recommend dc to SNF for further OT to optimize safety, independence with ADLs, and return to PLOF.      Follow Up Recommendations  SNF;Supervision/Assistance - 24 hour    Equipment Recommendations  Other (comment)(Defer to next venue)    Recommendations for Other Services PT consult     Precautions / Restrictions Precautions Precautions: Fall Restrictions Weight Bearing Restrictions: No      Mobility Bed Mobility Overal bed mobility: Needs Assistance Bed Mobility: Supine to Sit     Supine to sit: Min assist;HOB elevated     General bed mobility comments: Assist to elevate trunk to get to EOB, retropulsion noted.  Transfers Overall transfer level: Needs assistance Equipment used: Rolling walker (2 wheeled) Transfers: Sit to/from Stand Sit to Stand: Mod assist;From elevated surface         General transfer comment: Assist to power to standing with use of momentum, multiple attempts to stand.     Balance Overall balance assessment: Needs assistance;History of Falls Sitting-balance support: Feet supported;Single  extremity supported Sitting balance-Leahy Scale: Poor Sitting balance - Comments: Requires at least UE support static sitting EOB.   Standing balance support: During functional activity;Bilateral upper extremity supported Standing balance-Leahy Scale: Poor Standing balance comment: Reliant on BUEs for support in standing and external support due to posterior bias/lean.                           ADL either performed or assessed with clinical judgement   ADL Overall ADL's : Needs assistance/impaired Eating/Feeding: Independent;Sitting   Grooming: Oral care;Minimal assistance;Standing;Min guard Grooming Details (indicate cue type and reason): Min A for standing balance at sink. Pt with posterior lean. Upper Body Bathing: Minimal assistance;Sitting   Lower Body Bathing: Moderate assistance;Sit to/from stand   Upper Body Dressing : Minimal assistance;Sitting   Lower Body Dressing: Moderate assistance;Sit to/from stand Lower Body Dressing Details (indicate cue type and reason): Mod A for dynamic standing balance. Pt able to bend forawrd to adjust socks while sitting at EOB.  Toilet Transfer: Moderate assistance;Ambulation;RW(Simulated in room) Toilet Transfer Details (indicate cue type and reason): Mod A for power up into standing and then safe descent         Functional mobility during ADLs: Minimal assistance;Moderate assistance;Rolling walker General ADL Comments: Single episode of LOB and requiring Mod A for balance correction and fall prevention.      Vision         Perception     Praxis      Pertinent Vitals/Pain Pain Assessment: No/denies pain     Hand Dominance Left(Reporting left handed, but using right hand  during oral care)   Extremity/Trunk Assessment Upper Extremity Assessment Upper Extremity Assessment: Generalized weakness   Lower Extremity Assessment Lower Extremity Assessment: Defer to PT evaluation   Cervical / Trunk Assessment Cervical /  Trunk Assessment: Kyphotic   Communication Communication Communication: No difficulties   Cognition Arousal/Alertness: Awake/alert Behavior During Therapy: WFL for tasks assessed/performed Overall Cognitive Status: History of cognitive impairments - at baseline                                 General Comments: Cognitive deficit makes accuracy of patient report of PLOF and hx quetionable. AnOx4   General Comments  Urine on pt's table having spilled from urinal. Cleaned and Rn notified.    Exercises     Shoulder Instructions      Home Living Family/patient expects to be discharged to:: Assisted living                             Home Equipment: Walker - 2 wheels   Additional Comments: help with showering provided, could not detail equipment available      Prior Functioning/Environment Level of Independence: Needs assistance  Gait / Transfers Assistance Needed: Walked to and from dining hall 2-3 times a day. ADL's / Homemaking Assistance Needed: Assisted living facility takes care of food, cleaning, help with bathing. He dressed himself.             OT Problem List: Decreased activity tolerance;Impaired balance (sitting and/or standing);Decreased safety awareness;Decreased knowledge of precautions;Decreased knowledge of use of DME or AE      OT Treatment/Interventions: Self-care/ADL training;Therapeutic exercise;Energy conservation;DME and/or AE instruction;Therapeutic activities;Patient/family education    OT Goals(Current goals can be found in the care plan section) Acute Rehab OT Goals Patient Stated Goal: to go home OT Goal Formulation: With patient Time For Goal Achievement: 08/18/18 Potential to Achieve Goals: Good ADL Goals Pt Will Perform Grooming: with set-up;with supervision;standing Pt Will Perform Upper Body Dressing: with set-up;with supervision;sitting Pt Will Perform Lower Body Dressing: with set-up;with supervision;sit to/from  stand Pt Will Transfer to Toilet: with set-up;with supervision;bedside commode;ambulating Pt Will Perform Toileting - Clothing Manipulation and hygiene: with set-up;with supervision;sit to/from stand;sitting/lateral leans  OT Frequency: Min 2X/week   Barriers to D/C:            Co-evaluation              AM-PAC PT "6 Clicks" Daily Activity     Outcome Measure Help from another person eating meals?: None Help from another person taking care of personal grooming?: A Little Help from another person toileting, which includes using toliet, bedpan, or urinal?: A Little Help from another person bathing (including washing, rinsing, drying)?: A Lot Help from another person to put on and taking off regular upper body clothing?: A Little Help from another person to put on and taking off regular lower body clothing?: A Lot 6 Click Score: 17   End of Session Equipment Utilized During Treatment: Gait belt;Rolling walker Nurse Communication: Mobility status  Activity Tolerance: Patient tolerated treatment well Patient left: in bed;with call bell/phone within reach  OT Visit Diagnosis: Unsteadiness on feet (R26.81);Other abnormalities of gait and mobility (R26.89);Muscle weakness (generalized) (M62.81);Other symptoms and signs involving cognitive function                Time: 1610-9604 OT Time Calculation (min): 19 min Charges:  OT  General Charges $OT Visit: 1 Visit OT Evaluation $OT Eval Moderate Complexity: 1 Mod  Dereon Williamsen MSOT, OTR/L Acute Rehab Pager: [phone] Office: [phone]  Theodoro GristCharis M Imaya Duffy 08/04/2018, 4:36 PM

## 2018-08-04 NOTE — Progress Notes (Signed)
ANTICOAGULATION CONSULT NOTE Pharmacy Consult for heparin Indication: chest pain/ACS  Allergies  Allergen Reactions  . Codeine    Labs: Recent Labs    08/03/18 2036 08/03/18 2345 08/04/18 0958  HGB 14.1  --   --   HCT 46.6  --   --   PLT 275  --   --   HEPARINUNFRC  --   --  0.48  CREATININE 1.07  --   --   TROPONINI  --  0.26* 0.26*    Estimated Creatinine Clearance: 88 mL/min (by C-G formula based on SCr of 1.07 mg/dL).   Assessment: 62 yo man to start heparin for CP.  He was not on anticoagulation PTA.  He did receive lovenox 40 mg sq at ~02:00.  Baseline labs Hg 14.1, PTLC 275  Initial heparin level therapeutic at 0.48  Goal of Therapy:  Heparin level 0.3-0.7 units/ml Monitor platelets by anticoagulation protocol: Yes   Plan:  Continue heparin at 1200 units / hr Daily HL and CBC while on heparin Monitor for bleeding complications  Thank you Okey RegalLisa Georgios Kina, PharmD [phone] 08/04/2018,11:19 AM

## 2018-08-04 NOTE — Plan of Care (Signed)
  Problem: Safety: Goal: Ability to remain free from injury will improve Outcome: Progressing   

## 2018-08-04 NOTE — Discharge Summary (Addendum)
Physician Discharge Summary  [name] ZOX:[id number] DOB: Oct 21, 1955 DOA: 08/03/2018  PCP: Oneal Grout, MD  Admit date: 08/03/2018 Discharge date: 08/04/2018  Time spent: 40 minutes  Recommendations for Outpatient Follow-up:  1. Dr Royann Shivers will contact for follow up appointment 2. OP echo. Office will call to schedule 3. Take medications as directed 4. Follow up with neurology for evaluation of progression of Parkinson's as increased falls 5. Facility to provide daily PT for worsening gait 6. Follow up with PCP for evaluation of diabetes control   Discharge Diagnoses:  Principal Problem:   NSTEMI (non-ST elevated myocardial infarction) (HCC) Active Problems:   Cognitive impairment   Hypertension   Hypercholesterolemia   Diabetes mellitus type 2, controlled (HCC)   Coronary artery disease   Fall   Parkinson's disease (HCC)   Elevated troponin   Leukocytosis   Discharge Condition: stable  Diet recommendation: heart healthy carb modified  Filed Weights   08/04/18 0128  Weight: 113.4 kg    History of present illness:  Alan Wells is a 62 y.o. male with a hx of CAD s/p remote LAD stent placement with normal nuc 05/2015, Parkinson's disease, cognitive impairment, DM, dysphagia, gait instability, HTN, HLD who is being seen today for the evaluation of elevated troponin.  In EDpt was found to have elevated troponin 0.10, electrolytes renal function okay, WBC 18.0, no tachycardia, no tachypnea, oxygen saturation 91 to 98% on room air, temperature normal.  Chest x-ray showed left basilar atelectasis.  CT head is negative for acute intracranial abnormalities.  Patient is placed on telemetry bed of observation.   Hospital Course:  Falls: No significant injury.  CT head is negative for acute intracranial abnormalities.  No focal neurologic findings on physical examination. Not orthostatic. Patient denied loss of consciousness from any fall.  Most likely due to Parkinson's  disease.  Patient had leukocytosis with WBC 18.0 that resolved at discharge. No fever. Urinalysis concerning for UTI. Provided with Rocephin. Will discharge with cipro 10 days. Urine culture pending  Elevated troponin and CAD: s/p of stent.  Patient did not have any chest pain or shortness of breath.  No palpitation.  Not sure why troponin was checked by ED. Troponin trended up from 0.1 >>>0.26. Was started on heparin.  Evaluated by cardiology who opine that clinical events not consistent with ACS, not DAPT candidate and no indication for invasive evaluation. Cardiology further opined elevated troponin as a sign of poor mid and long term CV prognosis but no indicator of an acute coronary event. Echo to be scheduled OP  HTN: fair control. Continue home medications: Lisinopril, metoprolol  Hypercholesterolemia: -lipitor  Cognitive impairment: -Donepezil  Depression: -Requip  Diet-controlled diabetes mellitus type 2, controlled (HCC): Last A1c 6.3 on 06/10/18,well controled. Patient is not taking meds at home. Serum glucose 159 this am OP follow up   Parkinson's disease (HCC): -Sinemet -PT/OT  Leukocytosis: WBC 18.0. No fever. No source of infection identified. Urinalysis concernin for UTI. Rocephin provided. Urine culture pending With discharge with cipro  Procedures:    Consultations:  Dr Royann Shivers cardiology  Discharge Exam: Vitals:   08/04/18 0842 08/04/18 1218  BP: (!) 145/67 (!) 147/64  Pulse: 67 64  Resp:  18  Temp:  (!) 97.3 F (36.3 C)  SpO2: 96% 98%    General: well nourished watching TV in no acute distress Cardiovascular: rrr no mgr no LE edema Respiratory: normal effort BS clear bilaterally no wheeze Discharge Instructions   Discharge Instructions    Call  MD for:  difficulty breathing, headache or visual disturbances   Complete by:  As directed    Call MD for:  persistant nausea and vomiting   Complete by:  As directed    Call MD for:   temperature >100.4   Complete by:  As directed    Diet - low sodium heart healthy   Complete by:  As directed    Discharge instructions   Complete by:  As directed    Take medications as directed Follow up with PCP 1 week for evaluation of gait instability and resolution of UTI PT per recommendation   Increase activity slowly   Complete by:  As directed      Allergies as of 08/04/2018      Reactions   Codeine       Medication List    TAKE these medications   aspirin 81 MG chewable tablet Chew 81 mg by mouth daily.   atorvastatin 40 MG tablet Commonly known as:  LIPITOR Take 40 mg by mouth at bedtime. \   carbidopa-levodopa 25-100 MG tablet Commonly known as:  SINEMET IR Take 1.5 tablets by mouth 4 (four) times daily. Take 1 1/2 tablet   chlorhexidine 0.12 % solution Commonly known as:  PERIDEX Use as directed 5 mLs in the mouth or throat at bedtime.   ciprofloxacin 500 MG tablet Commonly known as:  CIPRO Take 1 tablet (500 mg total) by mouth 2 (two) times daily for 10 days.   donepezil 10 MG tablet Commonly known as:  ARICEPT Take 10 mg by mouth at bedtime.   lisinopril 10 MG tablet Commonly known as:  PRINIVIL,ZESTRIL Take 10 mg by mouth daily.   metoprolol succinate 100 MG 24 hr tablet Commonly known as:  TOPROL-XL Take 100 mg by mouth daily.   NON FORMULARY Hemp oil -4 sprays sublingual once a day   rOPINIRole 0.25 MG tablet Commonly known as:  REQUIP Take 0.25 mg by mouth daily.   sennosides-docusate sodium 8.6-50 MG tablet Commonly known as:  SENOKOT-S Take 1 tablet by mouth every other day.      Allergies  Allergen Reactions  . Codeine       The results of significant diagnostics from this hospitalization (including imaging, microbiology, ancillary and laboratory) are listed below for reference.    Significant Diagnostic Studies: Dg Chest 2 View  Result Date: 08/03/2018 CLINICAL DATA:  Weakness EXAM: CHEST - 2 VIEW COMPARISON:  None  FINDINGS: Heart is normal size. Left base atelectasis. Right lung clear. No effusions. Heart is normal size. No acute bony abnormality. IMPRESSION: Left basilar atelectasis. Electronically Signed   By: Charlett Nose M.D.   On: 08/03/2018 21:15   Ct Head Wo Contrast  Result Date: 08/03/2018 CLINICAL DATA:  Fall. EXAM: CT HEAD WITHOUT CONTRAST TECHNIQUE: Contiguous axial images were obtained from the base of the skull through the vertex without intravenous contrast. COMPARISON:  May 31, 2017 FINDINGS: Brain: No subdural, epidural, or subarachnoid hemorrhage. Ventricles and sulci are unremarkable. Cerebellum, brainstem, and basal cisterns are normal. No mass effect or midline shift. No acute cortical ischemia or infarct. Vascular: Calcified atherosclerosis in the intracranial carotids. Skull: Normal. Negative for fracture or focal lesion. Sinuses/Orbits: Mucosal thickening in the maxillary sinuses, right greater than left. No air-fluid levels. Paranasal sinuses, mastoid air cells, and middle ears are otherwise unremarkable. Other: None. IMPRESSION: 1. No acute intracranial abnormalities noted. Electronically Signed   By: Gerome Sam III M.D   On: 08/03/2018 21:45  Microbiology: Recent Results (from the past 240 hour(s))  Urine Culture     Status: None (Preliminary result)   Collection Time: 08/04/18  2:57 AM  Result Value Ref Range Status   Specimen Description URINE, CLEAN CATCH  Final   Special Requests   Final    NONE Performed at [facility], [address] [place], [place] [address]    Culture PENDING  Incomplete   Report Status PENDING  Incomplete     Labs: Basic Metabolic Panel: Recent Labs  Lab 08/03/18 2036  NA 134*  K 3.6  CL 106  CO2 20*  GLUCOSE 159*  BUN 18  CREATININE 1.07  CALCIUM 9.0   Liver Function Tests: No results for input(s): AST, ALT, ALKPHOS, BILITOT, PROT, ALBUMIN in the last 168 hours. No results for input(s): LIPASE, AMYLASE in the last  168 hours. No results for input(s): AMMONIA in the last 168 hours. CBC: Recent Labs  Lab 08/03/18 2036  WBC 18.0*  HGB 14.1  HCT 46.6  MCV 92.6  PLT 275   Cardiac Enzymes: Recent Labs  Lab 08/03/18 2345 08/04/18 0958  TROPONINI 0.26* 0.26*   BNP: BNP (last 3 results) No results for input(s): BNP in the last 8760 hours.  ProBNP (last 3 results) No results for input(s): PROBNP in the last 8760 hours.  CBG: Recent Labs  Lab 08/03/18 2048 08/04/18 0052  GLUCAP 139* 107*       Signed:  Gwenyth BenderBLACK,Daesia Zylka M . NP  [facility] 08/04/2018, 2:38 PM

## 2018-08-04 NOTE — Progress Notes (Signed)
PT Cancellation Note  Patient Details Name: Dayton BailiffHarry Bermea MRN: [id number] DOB: 1955/09/23   Cancelled Treatment:    Reason Eval/Treat Not Completed: Medical issues which prohibited therapy Holding PT evaluation due to upward trending troponins. Will await for downward/flat trend (per protocol) or clearance from cardiology prior to evaluation. Will follow.   Blake DivineShauna A Keymani Glynn 08/04/2018, 9:21 AM Mylo RedShauna Kie Calvin, PT, DPT Acute Rehabilitation Services Pager [phone] Office [phone]

## 2018-08-04 NOTE — Plan of Care (Signed)
  Problem: Safety: Goal: Ability to remain free from injury will improve Outcome: Not Progressing  Pt. Confused and needs reorienting. Attempts to get out of bed unassisted. Pt. Unsteady on feet.

## 2018-08-04 NOTE — Consult Note (Addendum)
Cardiology Consultation:   Patient ID: Alan Wells; [id number]; 08-22-1956   Admit date: 08/03/2018 Date of Consult: 08/04/2018  Primary Care Provider: Oneal Grout, MD Primary Cardiologist: No primary care provider on file. Remotely seen by Dr. Gilmore Laroche. Primary Electrophysiologist:  None  Chief Complaint: falling  Patient Profile:   Alan Wells is a 62 y.o. male with a hx of CAD s/p remote LAD stent placement with normal nuc 05/2015, Parkinson's disease, cognitive impairment, DM, dysphagia, gait instability, HTN, HLD who is being seen today for the evaluation of elevated troponin at the request of Dr. Clyde Lundborg.  History of Present Illness:   The patient has cognitive impairment but does seem to be able to answer historical questions, albeit with minimal details in his answers. He reports a history of a stent about 10 years ago in [address], [place] and Parkinson's but denies any other known history despite mention of the above. There is a scanned cardiology note under Media from 07/2016 Dr. Gilmore Laroche in [place], [place] referencing LAD stent 4 years prior. There is a scanned nuc from 05/2015 which was normal with EF 55%.  He lives at [facility] and has poor balance. He tells me he's been falling more recently but without specific reason. Admission H/P indicates some lightheadedness. He denies any specific related symptoms, just that he tends to fall over his knees. He denies any CP, SOB, palpitations, diaphoresis or nausea. In the ER he was normotensive to slightly hypertensive. Orthostatics were checked and were negative but this was a few hours after he was given 500cc bolus and IVF repletion. CXR showed L basilar atx but otherwise nonacute. CT head nonacute. Tele unremarkable (falsely triggers for sinus brady; intervals confirm HR 60s). Troponins were checked without corresponding cardiac complaint and were found to be elevated at 0.10->0.26. He is also noted to have leukocytosis with WBC 18k  without focal complaint. He was placed on IV heparin and cardiology asked to consult. He continues to deny any recent cardiac symptoms and states he presently feels fine.  Past Medical History:  Diagnosis Date  . Adult onset fluency disorder   . Coronary artery disease 2016   s/p stent left anterior descending artery   . Diabetes mellitus type 2, controlled (HCC) 01/18/2016  . Dysphagia 2018  . Gait instability 2018  . History of falling   . Hypercholesterolemia   . Hypertension   . Memory change 2018  . Parkinson's disease (HCC) 2018  . Weight loss 2017   intentional    Past Surgical History:  Procedure Laterality Date  . CAROTID STENT  2012   in left anterior descending artery past EF 55%  . nuclear stress test  06/17/2015   Gated SPECT images reveal normal LV systolic function. EF 55%. Comparing the rest & stress SPECT images, no ischemia. Gated images are normal. Dr. Vernell Barrier, MD [place], [place]      Inpatient Medications: Scheduled Meds: . aspirin  243 mg Oral Daily  . atorvastatin  40 mg Oral QHS  . carbidopa-levodopa  1.5 tablet Oral QID  . chlorhexidine  5 mL Mouth/Throat QHS  . donepezil  10 mg Oral QHS  . lisinopril  10 mg Oral Daily  . metoprolol succinate  100 mg Oral Daily  . pneumococcal 23 valent vaccine  0.5 mL Intramuscular Tomorrow-1000  . rOPINIRole  0.25 mg Oral Daily  . senna-docusate  1 tablet Oral QODAY   Continuous Infusions: . sodium chloride 100 mL/hr at 08/04/18 0316  .  cefTRIAXone (ROCEPHIN)  IV    . heparin 1,200 Units/hr (08/04/18 0317)   PRN Meds: acetaminophen, hydrALAZINE, morphine injection, nitroGLYCERIN, ondansetron (ZOFRAN) IV, zolpidem  Home Meds: Prior to Admission medications   Medication Sig Start Date End Date Taking? Authorizing Provider  aspirin 81 MG chewable tablet Chew 81 mg by mouth daily.    Yes [provider]  atorvastatin (LIPITOR) 40 MG tablet Take 40 mg by mouth at bedtime. \   Yes [provider]  carbidopa-levodopa (SINEMET IR) 25-100 MG tablet Take 1.5 tablets by mouth 4 (four) times daily. Take 1 1/2 tablet   Yes [provider]  chlorhexidine (PERIDEX) 0.12 % solution Use as directed 5 mLs in the mouth or throat at bedtime.   Yes [provider]  donepezil (ARICEPT) 10 MG tablet Take 10 mg by mouth at bedtime.   Yes [provider]  lisinopril (PRINIVIL,ZESTRIL) 10 MG tablet Take 10 mg by mouth daily.    Yes [provider]  metoprolol succinate (TOPROL-XL) 100 MG 24 hr tablet Take 100 mg by mouth daily.    Yes [provider]  NON FORMULARY Hemp oil -4 sprays sublingual once a day   Yes [provider]  rOPINIRole (REQUIP) 0.25 MG tablet Take 0.25 mg by mouth daily.    Yes [provider]  sennosides-docusate sodium (SENOKOT-S) 8.6-50 MG tablet Take 1 tablet by mouth every other day.    Yes [provider]    Allergies:    Allergies  Allergen Reactions  . Codeine     Social History:   Social History   Socioeconomic History  . Marital status: Single    Spouse name: Not on file  . Number of children: Not on file  . Years of education: Not on file  . Highest education level: Not on file  Occupational History  . 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 Financial resource strain: Not on file  . Food insecurity:    Worry: Not on file    Inability: Not on file  . Transportation needs:    Medical: Not on file    Non-medical: Not on file  Tobacco Use  . Smoking status: Never Smoker  . Smokeless tobacco: Never Used  Substance and Sexual Activity  . Alcohol use: No  . Drug use: No  . Sexual activity: Never  Lifestyle  . Physical activity:    Days per week: Not on file    Minutes per session: Not on file  . Stress: Not on file  Relationships  . Social connections:    Talks on phone: Not on file    Gets together: Not on file    Attends religious service: Not on file     Active member of club or organization: Not on file    Attends meetings of clubs or organizations: Not on file    Relationship status: Not on file  . Intimate partner violence:    Fear of current or ex partner: Not on file    Emotionally abused: Not on file    Physically abused: Not on file    Forced sexual activity: Not on file  Other Topics Concern  . Not on file  Social History Narrative   [place] [facility] [place]   Single   Never smoked   Alcohol none    [employer]   Walks with walker    Family History:   The patient's family history includes  Cancer in his mother; Heart disease in his mother.  ROS:  Please see the history of present illness.  All other ROS reviewed and negative.     Physical Exam/Data:   Vitals:   08/03/18 2345 08/04/18 0128 08/04/18 0219 08/04/18 0842  BP: 136/70   (!) 145/67  Pulse:    67  Resp: 19     Temp:   98.6 F (37 C)   TempSrc:   Oral   SpO2:   97% 96%  Weight:  113.4 kg    Height:  5\' 10"  (1.778 m)      Intake/Output Summary (Last 24 hours) at 08/04/2018 1048 Last data filed at 08/04/2018 1019 Gross per 24 hour  Intake 1079.99 ml  Output 100 ml  Net 979.99 ml   Filed Weights   08/04/18 0128  Weight: 113.4 kg   Body mass index is 35.86 kg/m.  General: Well developed, well nourished, in no acute distress. Head: Normocephalic, atraumatic, sclera non-icteric, no xanthomas, nares are without discharge. Neck: Negative for carotid bruits. JVD not elevated. Lungs: Clear bilaterally to auscultation without wheezes, rales, or rhonchi. Breathing is unlabored. Heart: RRR with S1 S2. No murmurs, rubs, or gallops appreciated. Abdomen: Soft, non-tender, non-distended with normoactive bowel sounds. No hepatomegaly. No rebound/guarding. No obvious abdominal masses. Msk:  Strength and tone appear normal for age. Extremities: No clubbing or cyanosis. No edema.  Distal pedal pulses are 2+ and equal bilaterally. Neuro: Alert  and oriented to "[facility]," year, self. Masked facies with minimal blinking. Smiling at times. Moves all extremities spontaneously but slower cadence of voice and speaks each word with deliberate thought Psych:  Responds to questions appropriately with a normal affect.  EKG:  The EKG was personally reviewed and demonstrates NSR 87bpm with probable LAE, nonspecific TWI III otherwise nonacute.  Relevant CV Studies: Referenced above.  Laboratory Data:  Chemistry Recent Labs  Lab 08/03/18 2036  NA 134*  K 3.6  CL 106  CO2 20*  GLUCOSE 159*  BUN 18  CREATININE 1.07  CALCIUM 9.0  GFRNONAA >60  GFRAA >60  ANIONGAP 8    No results for input(s): PROT, ALBUMIN, AST, ALT, ALKPHOS, BILITOT in the last 168 hours. Hematology Recent Labs  Lab 08/03/18 2036  WBC 18.0*  RBC 5.03  HGB 14.1  HCT 46.6  MCV 92.6  MCH 28.0  MCHC 30.3  RDW 13.3  PLT 275   Cardiac Enzymes Recent Labs  Lab 08/03/18 2345  TROPONINI 0.26*    Recent Labs  Lab 08/03/18 2102  TROPIPOC 0.10*    BNPNo results for input(s): BNP, PROBNP in the last 168 hours.  DDimer No results for input(s): DDIMER in the last 168 hours.  Radiology/Studies:  Dg Chest 2 View  Result Date: 08/03/2018 CLINICAL DATA:  Weakness EXAM: CHEST - 2 VIEW COMPARISON:  None FINDINGS: Heart is normal size. Left base atelectasis. Right lung clear. No effusions. Heart is normal size. No acute bony abnormality. IMPRESSION: Left basilar atelectasis. Electronically Signed   By: Charlett Nose M.D.   On: 08/03/2018 21:15   Ct Head Wo Contrast  Result Date: 08/03/2018 CLINICAL DATA:  Fall. EXAM: CT HEAD WITHOUT CONTRAST TECHNIQUE: Contiguous axial images were obtained from the base of the skull through the vertex without intravenous contrast. COMPARISON:  May 31, 2017 FINDINGS: Brain: No subdural, epidural, or subarachnoid hemorrhage. Ventricles and sulci are unremarkable. Cerebellum, brainstem, and basal cisterns are normal.  No mass effect or midline shift. No acute cortical  ischemia or infarct. Vascular: Calcified atherosclerosis in the intracranial carotids. Skull: Normal. Negative for fracture or focal lesion. Sinuses/Orbits: Mucosal thickening in the maxillary sinuses, right greater than left. No air-fluid levels. Paranasal sinuses, mastoid air cells, and middle ears are otherwise unremarkable. Other: None. IMPRESSION: 1. No acute intracranial abnormalities noted. Electronically Signed   By: Gerome Sam III M.D   On: 08/03/2018 21:45    Assessment and Plan:   1. Increased falling/gait instability - per primary team. Not clearly related to any specific cardiac finding.  2. Elevated troponin with h/o CAD - he denies any CP or SOB whatsoever. He is on ASA at higher dose per primary team. He is also on BB and atorvastatin. Given lack of cardiac symptoms it is unclear what this represents. It does not appear to be indicative of an acute coronary syndrome. I will review with MD regarding further w/u and plan for IV heparin. Given frequent falling, would be concerned about escalation of antiplatelet regimen due to risk of injury and bleeding. It would be helpful to have a baseline echocardiogram on file so will obtain.  3. Essential HTN - BP normal to mildly hypertensive at times. Given h/o falling, may need to be more lenient and accepting of these values.  4. Hyperlipidemia - he is on atorvastatin.  For questions or updates, please contact [facility] Please consult [email or web address] for contact info under Cardiology/STEMI.    Signed, Laurann Montana, PA-C  08/04/2018 10:48 AM   I have seen and examined the patient along with Laurann Montana, PA-C .  I have reviewed the chart, notes and new data.  I agree with PA/NP's note.  Key new complaints: he reiterates that he has not experienced any recent chest pain or dyspnea. Key examination changes: normal CV exam except short apical systolic murmur Key new findings /  data: very mild, but unequivocal elevation in troponin. Normal ECG.   PLAN: Clinical events are not consistent with acute coronary insufficiency. No indication for invasive evaluation. Stop IV heparin. With his frequent falls, he is not a good candidate for DAPT. Will check an echo for EF, wall motion and the apical murmur. This is not an urgent study and can be performed outpatient if there is a delay in obtaining it. I would interpret the elevated troponin as a sign of poor mid and long term CV prognosis, but not an indicator of an acute coronary event.  Thurmon Fair, MD, [facility] [phone] 08/04/2018, 11:18 AM

## 2018-08-04 NOTE — Progress Notes (Signed)
ANTICOAGULATION CONSULT NOTE - Initial Consult  Pharmacy Consult for heparin Indication: chest pain/ACS  Allergies  Allergen Reactions  . Codeine     Patient Measurements: Height: 5\' 10"  (177.8 cm) Weight: 249 lb 14.4 oz (113.4 kg) IBW/kg (Calculated) : 73 Heparin Dosing Weight: 97 kg  Vital Signs: Temp: 98.6 F (37 C) (11/18 0219) Temp Source: Oral (11/18 0219) BP: 136/70 (11/17 2345) Pulse Rate: 66 (11/17 2315)  Labs: Recent Labs    08/03/18 2036 08/03/18 2345  HGB 14.1  --   HCT 46.6  --   PLT 275  --   CREATININE 1.07  --   TROPONINI  --  0.26*    Estimated Creatinine Clearance: 88 mL/min (by C-G formula based on SCr of 1.07 mg/dL).   Medical History: Past Medical History:  Diagnosis Date  . Adult onset fluency disorder   . Coronary artery disease 2016   s/p stent left anterior descending artery   . Diabetes mellitus type 2, controlled (HCC) 01/18/2016  . Dysphagia 2018  . Gait instability 2018  . History of falling   . Hypercholesterolemia   . Hypertension   . Memory change 2018  . Parkinson's disease (HCC) 2018  . Weight loss 2017   intentional    Medications:  Medications Prior to Admission  Medication Sig Dispense Refill Last Dose  . aspirin 81 MG chewable tablet Chew 81 mg by mouth daily.    08/03/2018 at Unknown time  . atorvastatin (LIPITOR) 40 MG tablet Take 40 mg by mouth at bedtime. \   08/02/2018 at Unknown time  . carbidopa-levodopa (SINEMET IR) 25-100 MG tablet Take 1.5 tablets by mouth 4 (four) times daily. Take 1 1/2 tablet   08/03/2018 at Unknown time  . chlorhexidine (PERIDEX) 0.12 % solution Use as directed 5 mLs in the mouth or throat at bedtime.   08/02/2018 at Unknown time  . donepezil (ARICEPT) 10 MG tablet Take 10 mg by mouth at bedtime.   08/03/2018 at Unknown time  . lisinopril (PRINIVIL,ZESTRIL) 10 MG tablet Take 10 mg by mouth daily.    08/03/2018 at Unknown time  . metoprolol succinate (TOPROL-XL) 100 MG 24 hr tablet  Take 100 mg by mouth daily.    08/03/2018 at 0718  . NON FORMULARY Hemp oil -4 sprays sublingual once a day   08/03/2018 at Unknown time  . rOPINIRole (REQUIP) 0.25 MG tablet Take 0.25 mg by mouth daily.    08/03/2018 at Unknown time  . sennosides-docusate sodium (SENOKOT-S) 8.6-50 MG tablet Take 1 tablet by mouth every other day.    08/01/2018    Assessment: 62 yo man to start heparin for CP.  He was not on anticoagulation PTA.  He did receive lovenox 40 mg sq at ~02:00.  Baseline labs Hg 14.1, PTLC 275 Goal of Therapy:  Heparin level 0.3-0.7 units/ml Monitor platelets by anticoagulation protocol: Yes   Plan: Heparin bolus 4000 units and start drip at 1200 units/hr Check heparin level 6-8 hours after start Daily HL and CBC while on heparin Monitor for bleeding complications  Alan Wells 08/04/2018,2:39 AM

## 2018-08-04 NOTE — [place] FL2 (Signed)
[place] MEDICAID FL2 LEVEL OF CARE SCREENING TOOL     IDENTIFICATION  Patient Name: Alan Wells Birthdate: 1956/05/18 Sex: male Admission Date (Current Location): 08/03/2018  [facility] and [place] Number:  Producer, television/film/video and Address:  [facility] [place]. [facility], [address] [place], [place] [address]      Provider Number: [id number]  Attending Physician Name and Address:  Dimple Nanas, MD  Relative Name and Phone Number:       Current Level of Care: Hospital Recommended Level of Care: Skilled Nursing Facility Prior Approval Number:    Date Approved/Denied:   PASRR Number: Manual review  Discharge Plan: SNF    Current Diagnoses: Patient Active Problem List   Diagnosis Date Noted  . Elevated troponin 08/03/2018  . Leukocytosis 08/03/2018  . Parkinson's disease (HCC) 11/29/2017  . Constipation 06/03/2017  . Fall 06/03/2017  . Obesity (BMI 30-39.9) 05/10/2017  . Insomnia 03/19/2017  . NSTEMI (non-ST elevated myocardial infarction) (HCC)   . Hypertension   . Hypercholesterolemia   . 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 Cognitive impairment 09/17/2016  . Unsteady gait 09/17/2016  . Dysphagia 09/17/2016  . Diabetes mellitus type 2, controlled (HCC) 01/18/2016  . Coronary artery disease 09/17/2014    Orientation RESPIRATION BLADDER Height & Weight     Self, Time, Situation, Place  Normal Continent Weight: 249 lb 14.4 oz (113.4 kg) Height:  5\' 10"  (177.8 cm)  BEHAVIORAL SYMPTOMS/MOOD NEUROLOGICAL BOWEL NUTRITION STATUS  (None) (Cognitive impairment, Parkinson's) Continent Diet(Heart healthy/carb modified)  AMBULATORY STATUS COMMUNICATION OF NEEDS Skin   Limited Assist Verbally Normal                       Personal Care Assistance Level of Assistance  Bathing, Feeding, Dressing Bathing Assistance: Limited assistance Feeding assistance: Limited assistance Dressing Assistance: Limited assistance     Functional  Limitations Info  Sight, Hearing, Speech Sight Info: Adequate Hearing Info: Adequate Speech Info: Adequate    SPECIAL CARE FACTORS FREQUENCY  PT (By licensed PT), OT (By licensed OT), Blood pressure     PT Frequency: 5 x week OT Frequency: 5 x week            Contractures Contractures Info: Not present    Additional Factors Info  Code Status, Allergies Code Status Info: DNR Allergies Info: Codeine           Current Medications (08/04/2018):  This is the current hospital active medication list Current Facility-Administered Medications  Medication Dose Route Frequency Provider Last Rate Last Dose  . acetaminophen (TYLENOL) tablet 650 mg  650 mg Oral Q4H PRN Lorretta Harp, MD      . aspirin chewable tablet 243 mg  243 mg Oral Daily Lorretta Harp, MD   243 mg at 08/04/18 1015  . atorvastatin (LIPITOR) tablet 40 mg  40 mg Oral QHS Lorretta Harp, MD   40 mg at 08/04/18 0211  . carbidopa-levodopa (SINEMET IR) 25-100 MG per tablet immediate release 1.5 tablet  1.5 tablet Oral QID Lorretta Harp, MD   1.5 tablet at 08/04/18 1346  . cefTRIAXone (ROCEPHIN) 1 g in sodium chloride 0.9 % 100 mL IVPB  1 g Intravenous Q24H Amin, Ankit Chirag, MD 200 mL/hr at 08/04/18 1141 1 g at 08/04/18 1141  . chlorhexidine (PERIDEX) 0.12 % solution 5 mL  5 mL Mouth/Throat QHS Lorretta Harp, MD   5 mL at 08/04/18 0211  . donepezil (ARICEPT) tablet 10 mg  10  mg Oral Marice PotterQHS Niu, Xilin, MD   10 mg at 08/04/18 0211  . enoxaparin (LOVENOX) injection 40 mg  40 mg Subcutaneous Q24H Amin, Ankit Chirag, MD   40 mg at 08/04/18 1347  . hydrALAZINE (APRESOLINE) injection 5 mg  5 mg Intravenous Q2H PRN Lorretta HarpNiu, Xilin, MD      . lisinopril (PRINIVIL,ZESTRIL) tablet 10 mg  10 mg Oral Daily Lorretta HarpNiu, Xilin, MD   10 mg at 08/04/18 1015  . metoprolol succinate (TOPROL-XL) 24 hr tablet 100 mg  100 mg Oral Daily Lorretta HarpNiu, Xilin, MD   100 mg at 08/04/18 1016  . morphine 2 MG/ML injection 2 mg  2 mg Intravenous Q4H PRN Lorretta HarpNiu, Xilin, MD      . nitroGLYCERIN  (NITROSTAT) SL tablet 0.4 mg  0.4 mg Sublingual Q5 min PRN Lorretta HarpNiu, Xilin, MD      . ondansetron [facility](ZOFRAN) injection 4 mg  4 mg Intravenous Q6H PRN Lorretta HarpNiu, Xilin, MD      . pneumococcal 23 valent vaccine (PNU-IMMUNE) injection 0.5 mL  0.5 mL Intramuscular Tomorrow-1000 Lorretta HarpNiu, Xilin, MD      . rOPINIRole (REQUIP) tablet 0.25 mg  0.25 mg Oral Daily Lorretta HarpNiu, Xilin, MD   0.25 mg at 08/04/18 1016  . senna-docusate (Senokot-S) tablet 1 tablet  1 tablet Oral Raeanne GathersQODAY Niu, Xilin, MD   1 tablet at 08/04/18 1016  . zolpidem (AMBIEN) tablet 5 mg  5 mg Oral QHS PRN,MR X 1 Lorretta HarpNiu, Xilin, MD         Discharge Medications: Please see discharge summary for a list of discharge medications.  Relevant Imaging Results:  Relevant Lab Results:   Additional Information SS#: [id number]  Margarito LinerSarah C Makiya Jeune, LCSW

## 2018-08-05 ENCOUNTER — Inpatient Hospital Stay (HOSPITAL_COMMUNITY): Payer: BLUE CROSS/BLUE SHIELD

## 2018-08-05 DIAGNOSIS — R296 Repeated falls: Secondary | ICD-10-CM

## 2018-08-05 DIAGNOSIS — I2510 Atherosclerotic heart disease of native coronary artery without angina pectoris: Secondary | ICD-10-CM

## 2018-08-05 DIAGNOSIS — I214 Non-ST elevation (NSTEMI) myocardial infarction: Secondary | ICD-10-CM

## 2018-08-05 LAB — CBC
HEMATOCRIT: 40.7 % (ref 39.0–52.0)
Hemoglobin: 12.8 g/dL — ABNORMAL LOW (ref 13.0–17.0)
MCH: 28.9 pg (ref 26.0–34.0)
MCHC: 31.4 g/dL (ref 30.0–36.0)
MCV: 91.9 fL (ref 80.0–100.0)
NRBC: 0 % (ref 0.0–0.2)
PLATELETS: 215 10*3/uL (ref 150–400)
RBC: 4.43 MIL/uL (ref 4.22–5.81)
RDW: 13.4 % (ref 11.5–15.5)
WBC: 10.2 10*3/uL (ref 4.0–10.5)

## 2018-08-05 LAB — URINE CULTURE

## 2018-08-05 LAB — ECHOCARDIOGRAM COMPLETE
Height: 70 in
Weight: 3987.2 oz

## 2018-08-05 MED ORDER — ASPIRIN 81 MG PO CHEW
81.0000 mg | CHEWABLE_TABLET | Freq: Every day | ORAL | Status: DC
  Administered 2018-08-06 – 2018-08-07 (×2): 81 mg via ORAL
  Filled 2018-08-05 (×2): qty 1

## 2018-08-05 MED ORDER — SODIUM CHLORIDE 0.9 % IV SOLN
INTRAVENOUS | Status: DC | PRN
  Administered 2018-08-05: 250 mL via INTRAVENOUS

## 2018-08-05 NOTE — Clinical Social Work Note (Addendum)
[facility] believes they may have a bed for patient but will not know for sure until after meeting. They will follow up.  Charlynn CourtSarah Jaryn Rosko, CSW [phone]  9:55 am Uploaded requested documents into [place] [facility] for PASARR review.  Charlynn CourtSarah Jerrard Bradburn, CSW [phone]  11:34 am PASARR still pending. [facility] does not have a rehab bed available. [facility] admissions coordinator has contacted [facility] but they do not have beds either. She will reach out to patient's brother.  Charlynn CourtSarah Darothy Courtright, CSW [phone]  12:12 pm Called patient's brother and discussed HHPT at ALF vs. SNF. He is agreeable to SNF in an outside facility. He prefers [place] area because that is where he lives. Emailed him a SNF list as well as bed offers so far.  Charlynn CourtSarah Raiden Yearwood, CSW [phone]  12:48 pm PASARR obtained: [phone] E. Expires 12/19. Patient's brother asked about [facility]. CSW left message for admissions coordinator asking her to review referral.  Charlynn CourtSarah Jackob Cro[place], CSW [phone]  4:32 pm [facility] has offered patient a bed. Patient and his brother are agreeable. Patient can discharge once authorization approved.  Charlynn CourtSarah Aritza Brunet, CSW [phone]

## 2018-08-05 NOTE — Discharge Instructions (Signed)

## 2018-08-05 NOTE — Progress Notes (Signed)
PROGRESS NOTE   Alan Wells  ONG:[id number]    DOB: 1956/02/02    DOA: 08/03/2018  PCP: [name], [name], NP   I have briefly reviewed patients previous medical records in [facility].  Brief Narrative:  62 year old male, SNF resident, with PMH of Parkinson's disease, HTN, HLD, diet-controlled DM 2, depression, CAD status post remote LAD stent, cognitive impairment, increased falls recently, presented to ED following a fall, poor balance and lightheadedness.  He had no UTI symptoms.  Troponins were elevated.  Cardiology consulted and did not think that he had ACS and echo without wall motion abnormalities, signed off.  Patient was actually discharged to SNF on 11/18 but still awaiting insurance approval.   Assessment & Plan:   Principal Problem:   NSTEMI (non-ST elevated myocardial infarction) [facility]) Active Problems:   Cognitive impairment   Hypertension   Hypercholesterolemia   Diabetes mellitus type 2, controlled (HCC)   Coronary artery disease   Fall   Parkinson's disease (HCC)   Elevated troponin   Leukocytosis   1. Frequent falls: Likely related to Parkinson's disease with gait instability.  CT head without acute abnormalities.  No syncope reported.  PT and OT evaluated and recommended return to SNF for rehab.  Not orthostatic in the ED.  This afternoon, patient reportedly was found on one knee while attempting to move from his chair to the bed with some bruising but no other injuries.  Fall precautions. 2. Elevated troponin/history of CAD: Cardiology was consulted.  Unclear why troponin was checked but it was in ED.  Troponin with flat trend.  No chest pain.  No acute EKG changes.  TTE without wall motion abnormalities.  Cardiology signed off and will arrange outpatient follow-up in several months.  Continue aspirin, beta-blockers and statins. 3. Asymptomatic bacteriuria: Patient denied UTI symptoms.  He was empirically started on IV ceftriaxone.  Urine culture shows insignificant  growth.  Discontinued antibiotics and updated discharge summary. 4. Essential hypertension: Controlled.  Continue lisinopril and metoprolol. 5. Hyperlipidemia: Continue statins. 6. Parkinson's disease with cognitive impairment: Continue Sinemet and Aricept.  Outpatient follow-up with neurology. 7. Diet-controlled DM2: A1c 6.3. 8. Transient leukocytosis: Possibly reactive.  No clinical focus of infection.  Resolved.  Discontinue and observe off of antibiotics.   DVT prophylaxis: Lovenox Code Status: DNR as per documentation from SNF. Family Communication: None at bedside Disposition: DC to SNF pending insurance authorization.   Consultants:  Cardiology signed off.  Procedures:  TEE 08/05/2018: LVEF 60-65%.  Wall motion was normal and there were no regional wall motion abnormalities.  Antimicrobials:  IV ceftriaxone-Discontinued 11/19.   Subjective: Seen this morning.  Denies complaints.  No chest pain.  Denies dysuria, urinary frequency, fever or chills prior to arrival or during current hospital admission.  Subsequently this afternoon, RN reported that when patient was attempting to move from his chair to bed, noted on one knee and kneeling next to the bed with a small abrasion on right knee but no other injuries.  ROS: As above, otherwise negative.  Objective:  Vitals:   08/05/18 0359 08/05/18 1110 08/05/18 1400 08/05/18 2001  BP: (!) 146/74 (!) 143/70 127/68 134/63  Pulse: 63 61 (!) 57 64  Resp: 18   18  Temp: 98 F (36.7 C)  98.5 F (36.9 C) 99 F (37.2 C)  TempSrc: Oral  Oral Oral  SpO2: 95% 98% 96% 97%  Weight: 113 kg     Height:        Examination:  General exam: Pleasant middle-aged male, moderately built and obese sitting up comfortably in chair this morning. Respiratory system: Clear to auscultation. Respiratory effort normal. Cardiovascular system: S1 & S2 heard, RRR. No JVD, murmurs, rubs, gallops or clicks. No pedal edema.  Telemetry personally  reviewed: Sinus bradycardia in the 50s-sinus rhythm. Gastrointestinal system: Abdomen is nondistended, soft and nontender. No organomegaly or masses felt. Normal bowel sounds heard. Central nervous system: Alert and oriented x3. No focal neurological deficits. Extremities: Symmetric 5 x 5 power. Skin: No rashes, lesions or ulcers Psychiatry: Judgement and insight impaired. Mood & affect flat.    Data Reviewed: I have personally reviewed following labs and imaging studies  CBC: Recent Labs  Lab 08/03/18 2036 08/05/18 0427  WBC 18.0* 10.2  HGB 14.1 12.8*  HCT 46.6 40.7  MCV 92.6 91.9  PLT 275 215   Basic Metabolic Panel: Recent Labs  Lab 08/03/18 2036  NA 134*  K 3.6  CL 106  CO2 20*  GLUCOSE 159*  BUN 18  CREATININE 1.07  CALCIUM 9.0   Cardiac Enzymes: Recent Labs  Lab 08/03/18 2345 08/04/18 0958  TROPONINI 0.26* 0.26*   HbA1C: Recent Labs    08/04/18 0559  HGBA1C 6.3*   CBG: Recent Labs  Lab 08/03/18 2048 08/04/18 0052  GLUCAP 139* 107*    Recent Results (from the past 240 hour(s))  Culture, blood (Routine X 2) w Reflex to ID Panel     Status: None (Preliminary result)   Collection Time: 08/04/18 12:29 AM  Result Value Ref Range Status   Specimen Description BLOOD RIGHT HAND  Final   Special Requests   Final    BOTTLES DRAWN AEROBIC AND ANAEROBIC Blood Culture results may not be optimal due to an excessive volume of blood received in culture bottles   Culture   Final    NO GROWTH 1 DAY Performed at [facility], [address], [place], [place] [address]    Report Status PENDING  Incomplete  Culture, blood (Routine X 2) w Reflex to ID Panel     Status: None (Preliminary result)   Collection Time: 08/04/18 12:29 AM  Result Value Ref Range Status   Specimen Description BLOOD LEFT WRIST  Final   Special Requests   Final    BOTTLES DRAWN AEROBIC AND ANAEROBIC Blood Culture results may not be optimal due to an inadequate volume of blood  received in culture bottles   Culture   Final    NO GROWTH 1 DAY Performed at [facility], [address], [place], [place] [address]    Report Status PENDING  Incomplete  Urine Culture     Status: Abnormal   Collection Time: 08/04/18  2:57 AM  Result Value Ref Range Status   Specimen Description URINE, CLEAN CATCH  Final   Special Requests   Final    NONE Performed at [facility], [address], [place], [place] [address]    Culture MULTIPLE SPECIES PRESENT, SUGGEST RECOLLECTION (A)  Final   Report Status 08/05/2018 FINAL  Final         Radiology Studies: Dg Chest 2 View  Result Date: 08/03/2018 CLINICAL DATA:  Weakness EXAM: CHEST - 2 VIEW COMPARISON:  None FINDINGS: Heart is normal size. Left base atelectasis. Right lung clear. No effusions. Heart is normal size. No acute bony abnormality. IMPRESSION: Left basilar atelectasis. Electronically Signed   By: Charlett Nose M.D.   On: 08/03/2018 21:15   Ct Head  Wo Contrast  Result Date: 08/03/2018 CLINICAL DATA:  Fall. EXAM: CT HEAD WITHOUT CONTRAST TECHNIQUE: Contiguous axial images were obtained from the base of the skull through the vertex without intravenous contrast. COMPARISON:  May 31, 2017 FINDINGS: Brain: No subdural, epidural, or subarachnoid hemorrhage. Ventricles and sulci are unremarkable. Cerebellum, brainstem, and basal cisterns are normal. No mass effect or midline shift. No acute cortical ischemia or infarct. Vascular: Calcified atherosclerosis in the intracranial carotids. Skull: Normal. Negative for fracture or focal lesion. Sinuses/Orbits: Mucosal thickening in the maxillary sinuses, right greater than left. No air-fluid levels. Paranasal sinuses, mastoid air cells, and middle ears are otherwise unremarkable. Other: None. IMPRESSION: 1. No acute intracranial abnormalities noted. Electronically Signed   By: Gerome Samavid  Williams III M.D   On: 08/03/2018 21:45        Scheduled Meds: . aspirin  243  mg Oral Daily  . atorvastatin  40 mg Oral QHS  . carbidopa-levodopa  1.5 tablet Oral QID  . chlorhexidine  5 mL Mouth/Throat QHS  . donepezil  10 mg Oral QHS  . enoxaparin (LOVENOX) injection  40 mg Subcutaneous Q24H  . lisinopril  10 mg Oral Daily  . metoprolol succinate  100 mg Oral Daily  . rOPINIRole  0.25 mg Oral Daily  . senna-docusate  1 tablet Oral QODAY   Continuous Infusions: . sodium chloride 10 mL/hr at 08/05/18 1529  . cefTRIAXone (ROCEPHIN)  IV Stopped (08/05/18 1435)     LOS: 1 day     Marcellus ScottAnand Danial Hlavac, MD, FACP, [facility]. [facility] Pager [phone]  If 7PM-7AM, please contact night-coverage [email or web address] Password [email or web address] 08/05/2018, 8:11 PM

## 2018-08-05 NOTE — Progress Notes (Signed)
  Echocardiogram 2D Echocardiogram has been performed.  Alan Wells, Rolfe Hartsell 08/05/2018, 10:19 AM

## 2018-08-05 NOTE — Discharge Summary (Addendum)
Physician Discharge Summary  Alan BailiffHarry Wells JXB:[id number] DOB: 10-07-1955 DOA: 08/03/2018  PCP: [name], [name], NP  Admit date: 08/03/2018 Discharge date: 08/07/2018  Time spent: 40 minutes  Recommendations for Outpatient Follow-up:  1. MD at SNF in 2 days. 2. Man [name], NP/PCP 3. Dr. Alphonzo SeveranceMihai Croituru, Cardiology: Office will arrange follow-up. 4. Follow up with Neurology for evaluation of progression of Parkinson's as increased falls 5. Facility to provide daily PT for worsening gait   Discharge Diagnoses:  Principal Problem:   NSTEMI (non-ST elevated myocardial infarction) (HCC) Active Problems:   Cognitive impairment   Hypertension   Hypercholesterolemia   Diabetes mellitus type 2, controlled (HCC)   Coronary artery disease   Fall   Parkinson's disease (HCC)   Elevated troponin   Leukocytosis   Discharge Condition: stable  Diet recommendation: heart healthy carb modified  Filed Weights   08/04/18 0128 08/05/18 0359  Weight: 113.4 kg 113 kg    History of present illness:  Alan BailiffHarry Larrick is a 62 y.o. male with a hx of CAD s/p remote LAD stent placement with normal nuc 05/2015, Parkinson's disease, cognitive impairment, DM, dysphagia, gait instability, HTN, HLD who is being seen today for the evaluation of elevated troponin.  In EDpt was found to have elevated troponin 0.10, electrolytes renal function okay, WBC 18.0, no tachycardia, no tachypnea, oxygen saturation 91 to 98% on room air, temperature normal.  Chest Wells-ray showed left basilar atelectasis.  CT head is negative for acute intracranial abnormalities.  Patient is placed on telemetry bed of observation.   Hospital Course:  Falls: No significant injury.  CT head is negative for acute intracranial abnormalities.  No focal neurologic findings on physical examination. Not orthostatic. Patient denied loss of consciousness from any fall.  Most likely due to Parkinson's disease.  Patient had leukocytosis with WBC 18.0 that  resolved at discharge. No fever.  Patient at high fall risk given gait instability from his Parkinson's disease and cognitive impairment.  Patient was found kneeling on his right knee today while attempting to self transfer from chair to bed and sustained small abrasion.  Fall precautions.  Therapies evaluation at SNF.  Strongly recommend outpatient neurology consultation and follow-up to see if any of his medications can be adjusted.  Elevated troponin and CAD: s/p of stent.  Patient did not have any chest pain or shortness of breath.  No palpitation.  Not sure why troponin was checked by ED. Troponin trended up from 0.1 >>>0.26 but maintained flat trend. Was started on heparin.  Evaluated by cardiology who opine that clinical events not consistent with ACS, not DAPT candidate and no indication for invasive evaluation. Cardiology further opined elevated troponin as a sign of poor mid and long term CV prognosis but no indicator of an acute coronary event.  Echo was performed and showed normal EF without wall motion abnormalities.  Cardiology signed off and will arrange outpatient follow-up in several months.  HTN: fair control. Continue home medications: Lisinopril, metoprolol  Hypercholesterolemia: -lipitor  Cognitive impairment: -Donepezil  Depression: -Patient is possibly on Requip for restless leg.  Diet-controlled diabetes mellitus type 2, controlled (HCC):  A1c 6.3 on 11/18.  Parkinson's disease (HCC): -Sinemet -PT/OT  Leukocytosis: WBC 18.0. No fever. No source of infection identified.  Although urine microscopy showed pyuria, patient was asymptomatic of UTI symptoms.  Urine culture suggests contamination.  Empirically started ceftriaxone discontinued when no further antibiotics recommended.  Procedures:  None.  Consultations:  Dr Royann Shiversroitoru cardiology  Discharge Exam: Vitals:  08/05/18 1400 08/05/18 2001  BP: 127/68 134/63  Pulse: (!) 57 64  Resp:  18  Temp: 98.5  F (36.9 C) 99 F (37.2 C)  SpO2: 96% 97%    General: Pleasant middle-age male sitting up comfortably in chair without distress. Cardiovascular: S1 and S2 heard, RRR.  No JVD or murmurs.  Telemetry shows sinus bradycardia in the 50s-SR. Respiratory: Clear to auscultation.  No increased work of breathing. CNS: Alert and oriented x3.  No focal neurological deficits.  Discharge Instructions   Discharge Instructions    Call MD for:  difficulty breathing, headache or visual disturbances   Complete by:  As directed    Call MD for:  extreme fatigue   Complete by:  As directed    Call MD for:  persistant dizziness or light-headedness   Complete by:  As directed    Call MD for:  persistant nausea and vomiting   Complete by:  As directed    Call MD for:  severe uncontrolled pain   Complete by:  As directed    Call MD for:  temperature >100.4   Complete by:  As directed    Diet - low sodium heart healthy   Complete by:  As directed    Diet Carb Modified   Complete by:  As directed    Increase activity slowly   Complete by:  As directed      Allergies as of 08/05/2018      Reactions   Codeine       Medication List    TAKE these medications   aspirin 81 MG chewable tablet Chew 81 mg by mouth daily.   atorvastatin 40 MG tablet Commonly known as:  LIPITOR Take 40 mg by mouth at bedtime. \   carbidopa-levodopa 25-100 MG tablet Commonly known as:  SINEMET IR Take 1.5 tablets by mouth 4 (four) times daily. Take 1 1/2 tablet   chlorhexidine 0.12 % solution Commonly known as:  PERIDEX Use as directed 5 mLs in the mouth or throat at bedtime.   donepezil 10 MG tablet Commonly known as:  ARICEPT Take 10 mg by mouth at bedtime.   lisinopril 10 MG tablet Commonly known as:  PRINIVIL,ZESTRIL Take 10 mg by mouth daily.   metoprolol succinate 100 MG 24 hr tablet Commonly known as:  TOPROL-XL Take 100 mg by mouth daily.   NON FORMULARY Hemp oil -4 sprays sublingual once a  day   rOPINIRole 0.25 MG tablet Commonly known as:  REQUIP Take 0.25 mg by mouth daily.   sennosides-docusate sodium 8.6-50 MG tablet Commonly known as:  SENOKOT-S Take 1 tablet by mouth every other day.      Allergies  Allergen Reactions  . Codeine     Contact information for follow-up providers    [name], [name], NP. Schedule an appointment as soon as possible for a visit.   Specialty:  Internal Medicine Contact information: [address] [place] [address] [phone]        MD at SNF. Schedule an appointment as soon as possible for a visit in 2 day(s).            Contact information for after-discharge care    Destination    [facility]erred [facility] .   Service:  Skilled Nursing Contact information: [address] [place] [address] [phone]                   The results of  significant diagnostics from this hospitalization (including imaging, microbiology, ancillary and laboratory) are listed below for reference.    Significant Diagnostic Studies: Dg Chest 2 View  Result Date: 08/03/2018 CLINICAL DATA:  Weakness EXAM: CHEST - 2 VIEW COMPARISON:  None FINDINGS: Heart is normal size. Left base atelectasis. Right lung clear. No effusions. Heart is normal size. No acute bony abnormality. IMPRESSION: Left basilar atelectasis. Electronically Signed   By: Charlett Nose M.D.   On: 08/03/2018 21:15   Ct Head Wo Contrast  Result Date: 08/03/2018 CLINICAL DATA:  Fall. EXAM: CT HEAD WITHOUT CONTRAST TECHNIQUE: Contiguous axial images were obtained from the base of the skull through the vertex without intravenous contrast. COMPARISON:  May 31, 2017 FINDINGS: Brain: No subdural, epidural, or subarachnoid hemorrhage. Ventricles and sulci are unremarkable. Cerebellum, brainstem, and basal cisterns are normal. No mass effect or midline shift. No acute cortical ischemia or infarct. Vascular: Calcified atherosclerosis  in the intracranial carotids. Skull: Normal. Negative for fracture or focal lesion. Sinuses/Orbits: Mucosal thickening in the maxillary sinuses, right greater than left. No air-fluid levels. Paranasal sinuses, mastoid air cells, and middle ears are otherwise unremarkable. Other: None. IMPRESSION: 1. No acute intracranial abnormalities noted. Electronically Signed   By: Gerome Sam III M.D   On: 08/03/2018 21:45    Microbiology: Recent Results (from the past 240 hour(s))  Culture, blood (Routine Wells 2) w Reflex to ID Panel     Status: None (Preliminary result)   Collection Time: 08/04/18 12:29 AM  Result Value Ref Range Status   Specimen Description BLOOD RIGHT HAND  Final   Special Requests   Final    BOTTLES DRAWN AEROBIC AND ANAEROBIC Blood Culture results may not be optimal due to an excessive volume of blood received in culture bottles   Culture   Final    NO GROWTH 1 DAY Performed at [facility], [address], [place], [place] [address]    Report Status PENDING  Incomplete  Culture, blood (Routine Wells 2) w Reflex to ID Panel     Status: None (Preliminary result)   Collection Time: 08/04/18 12:29 AM  Result Value Ref Range Status   Specimen Description BLOOD LEFT WRIST  Final   Special Requests   Final    BOTTLES DRAWN AEROBIC AND ANAEROBIC Blood Culture results may not be optimal due to an inadequate volume of blood received in culture bottles   Culture   Final    NO GROWTH 1 DAY Performed at [facility], [address] [place], [place] [address]    Report Status PENDING  Incomplete  Urine Culture     Status: Abnormal   Collection Time: 08/04/18  2:57 AM  Result Value Ref Range Status   Specimen Description URINE, CLEAN CATCH  Final   Special Requests   Final    NONE Performed at [facility], [address] [place], [place] [address]    Culture MULTIPLE SPECIES PRESENT, SUGGEST RECOLLECTION (A)  Final   Report Status 08/05/2018 FINAL  Final      Labs: Basic Metabolic Panel: Recent Labs  Lab 08/03/18 2036  NA 134*  K 3.6  CL 106  CO2 20*  GLUCOSE 159*  BUN 18  CREATININE 1.07  CALCIUM 9.0   CBC: Recent Labs  Lab 08/03/18 2036 08/05/18 0427  WBC 18.0* 10.2  HGB 14.1 12.8*  HCT 46.6 40.7  MCV 92.6 91.9  PLT 275 215   Cardiac Enzymes: Recent Labs  Lab 08/03/18 2345 08/04/18 0958  TROPONINI 0.26* 0.26*    CBG: Recent Labs  Lab 08/03/18 2048 08/04/18 0052  GLUCAP 139* 107*       Marcellus Scott, MD, [place], [facility]. [facility] Pager [phone]  If 7PM-7AM, please contact night-coverage [email or web address] Password [email or web address] 08/05/2018, 8:47 PM

## 2018-08-05 NOTE — Progress Notes (Signed)
   Dr. Royann Shiversroitoru asked me to assist with making f/u in 4-5 months. Appt scheduled 12/08/18. This will auto populate on AVS (confirmed with nurse that it hadnt been printed yet). Kalah Pflum PA-C

## 2018-08-05 NOTE — Progress Notes (Signed)
Pt attempted to go from bedside chair to bed without assistance. Chair alarm sounded. RN found pt on one knee kneeling next to bed. Staff assisted pt back to bed. Pt has small abrasion on right knee. MD notified. No new orders.

## 2018-08-05 NOTE — Progress Notes (Signed)
Progress Note  Patient Name: Alan Wells Date of Encounter: 08/05/2018  Primary Cardiologist: No primary care provider on file. New  Subjective   No angina. Troponin "flat" Echo with normal wall motion and EF.  Inpatient Medications    Scheduled Meds: . aspirin  243 mg Oral Daily  . atorvastatin  40 mg Oral QHS  . carbidopa-levodopa  1.5 tablet Oral QID  . chlorhexidine  5 mL Mouth/Throat QHS  . donepezil  10 mg Oral QHS  . enoxaparin (LOVENOX) injection  40 mg Subcutaneous Q24H  . lisinopril  10 mg Oral Daily  . metoprolol succinate  100 mg Oral Daily  . pneumococcal 23 valent vaccine  0.5 mL Intramuscular Tomorrow-1000  . rOPINIRole  0.25 mg Oral Daily  . senna-docusate  1 tablet Oral QODAY   Continuous Infusions: . cefTRIAXone (ROCEPHIN)  IV 1 g (08/04/18 1141)   PRN Meds: acetaminophen, hydrALAZINE, morphine injection, nitroGLYCERIN, ondansetron (ZOFRAN) IV, zolpidem   Vital Signs    Vitals:   08/04/18 0842 08/04/18 1218 08/04/18 1923 08/05/18 0359  BP: (!) 145/67 (!) 147/64 (!) 121/53 (!) 146/74  Pulse: 67 64 76 63  Resp:  18 18 18   Temp:  (!) 97.3 F (36.3 C) 98.6 F (37 C) 98 F (36.7 C)  TempSrc:  Oral Oral Oral  SpO2: 96% 98% 95% 95%  Weight:    113 kg  Height:        Intake/Output Summary (Last 24 hours) at 08/05/2018 1054 Last data filed at 08/05/2018 0915 Gross per 24 hour  Intake 1160 ml  Output 250 ml  Net 910 ml   Filed Weights   08/04/18 0128 08/05/18 0359  Weight: 113.4 kg 113 kg    Telemetry    NSR - Personally Reviewed  ECG    No new tracing - Personally Reviewed  Physical Exam  Appears comfortable GEN: No acute distress.   Neck: No JVD Cardiac: RRR, no murmurs, rubs, or gallops.  Respiratory: Clear to auscultation bilaterally. GI: Soft, nontender, non-distended  MS: No edema; No deformity. Neuro:  unusual slow speech pattern, otherwise non-focal Psych: Normal affect   Labs    Chemistry Recent Labs  Lab  08/03/18 2036  NA 134*  K 3.6  CL 106  CO2 20*  GLUCOSE 159*  BUN 18  CREATININE 1.07  CALCIUM 9.0  GFRNONAA >60  GFRAA >60  ANIONGAP 8     Hematology Recent Labs  Lab 08/03/18 2036 08/05/18 0427  WBC 18.0* 10.2  RBC 5.03 4.43  HGB 14.1 12.8*  HCT 46.6 40.7  MCV 92.6 91.9  MCH 28.0 28.9  MCHC 30.3 31.4  RDW 13.3 13.4  PLT 275 215    Cardiac Enzymes Recent Labs  Lab 08/03/18 2345 08/04/18 0958  TROPONINI 0.26* 0.26*    Recent Labs  Lab 08/03/18 2102  TROPIPOC 0.10*     BNPNo results for input(s): BNP, PROBNP in the last 168 hours.   DDimer No results for input(s): DDIMER in the last 168 hours.   Radiology    Dg Chest 2 View  Result Date: 08/03/2018 CLINICAL DATA:  Weakness EXAM: CHEST - 2 VIEW COMPARISON:  None FINDINGS: Heart is normal size. Left base atelectasis. Right lung clear. No effusions. Heart is normal size. No acute bony abnormality. IMPRESSION: Left basilar atelectasis. Electronically Signed   By: Charlett Nose M.D.   On: 08/03/2018 21:15   Ct Head Wo Contrast  Result Date: 08/03/2018 CLINICAL DATA:  Fall. EXAM: CT HEAD  WITHOUT CONTRAST TECHNIQUE: Contiguous axial images were obtained from the base of the skull through the vertex without intravenous contrast. COMPARISON:  May 31, 2017 FINDINGS: Brain: No subdural, epidural, or subarachnoid hemorrhage. Ventricles and sulci are unremarkable. Cerebellum, brainstem, and basal cisterns are normal. No mass effect or midline shift. No acute cortical ischemia or infarct. Vascular: Calcified atherosclerosis in the intracranial carotids. Skull: Normal. Negative for fracture or focal lesion. Sinuses/Orbits: Mucosal thickening in the maxillary sinuses, right greater than left. No air-fluid levels. Paranasal sinuses, mastoid air cells, and middle ears are otherwise unremarkable. Other: None. IMPRESSION: 1. No acute intracranial abnormalities noted. Electronically Signed   By: Gerome Samavid  Williams III M.D   On:  08/03/2018 21:45    Cardiac Studies   ECHO 08/05/2018 - Left ventricle: The cavity size was normal. Systolic function was   normal. The estimated ejection fraction was in the range of 60%   to 65%. Wall motion was normal; there were no regional wall   motion abnormalities. Doppler parameters are consistent with   indeterminate mean left atrial filling pressure. - Left atrium: The atrium was mildly dilated.   Patient Profile     62 y.o. male with frequent falls, neurological disorder, known CAD with PCI-LAD 2016 admitted after a mechanical fall and found to have an abnormal troponin level in a "plateau pattern", without angina, ECG abnormalities or wall motion abnormalities.  Assessment & Plan    No further ischemia workup is planned. Continue aspirin, beta blocker and statin. [facility] will sign off.   Medication Recommendations:  Continue aspirin, beta blocker and statin in current doses. Other recommendations (labs, testing, etc):  n/a Follow up as an outpatient:  will schedule in several months  For questions or updates, please contact [facility] Please consult [email or web address] for contact info under        Signed, Thurmon FairMihai Oneal Biglow, MD  08/05/2018, 10:54 AM

## 2018-08-06 NOTE — Progress Notes (Signed)
PROGRESS NOTE    Alan Wells  OZH:[id number] DOB: 1956/01/13 DOA: 08/03/2018 PCP: [name], [name], NP    Brief Narrative:  62 year old male who presented after a mechanical fall.  He does have significant past medical history for Parkinson's disease, hypertension, dyslipidemia, type 2 diabetes mellitus, depression, coronary disease, and cognitive impairment.  He reported multiple falls at home, associated with poor balance and lightheadedness.  On the initial physical examination blood pressure 128/65, heart rate 74, respirate 19, oxygen saturation 95%.  Also present rhythmic, lungs clear to auscultation bilaterally, abdomen soft nontender, no lower extremity edema, strength was preserved all 4 extremities.  Troponins were elevated.  Patient was admitted to the hospital with the working diagnosis of multiple falls, complicated by elevated troponin.  Assessment & Plan:   Principal Problem:   NSTEMI (non-ST elevated myocardial infarction) (HCC) Active Problems:   Cognitive impairment   Hypertension   Hypercholesterolemia   Diabetes mellitus type 2, controlled (HCC)   Coronary artery disease   Fall   Parkinson's disease (HCC)   Elevated troponin   Leukocytosis   1. Ambulatory dysfunction in the setting of Parkinson's disease. Pending placement into SNF. No chest pain, or dyspnea, no nausea or vomiting. Continue sinemet.   2, CAD. Patient ruled out for acute coronary syndrome.   3. HTN. Continue blood pressure control with metoprolol and lisinopril.   4. T2DM. Continue glucose monitoring.    DVT prophylaxis: scd  Code Status:  dnr  Family Communication: no family at the bedside  Disposition Plan/ discharge barriers: pending SNF placement.   Body mass index is 35.3 kg/m. Malnutrition Type:      Malnutrition Characteristics:      Nutrition Interventions:     RN Pressure Injury Documentation:     Consultants:     Procedures:     Antimicrobials:       Subjective: Patient with no nausea or vomiting, no chest pain or dyspnea, continue to have difficulty ambulating.   Objective: Vitals:   08/05/18 2001 08/06/18 0538 08/06/18 0544 08/06/18 0937  BP: 134/63 137/71  139/66  Pulse: 64 (!) 54  62  Resp: 18 18    Temp: 99 F (37.2 C) 98.2 F (36.8 C)    TempSrc: Oral Oral    SpO2: 97% 96%  95%  Weight:   111.6 kg   Height:        Intake/Output Summary (Last 24 hours) at 08/06/2018 1002 Last data filed at 08/06/2018 0939 Gross per 24 hour  Intake 828.95 ml  Output 600 ml  Net 228.95 ml   Filed Weights   08/04/18 0128 08/05/18 0359 08/06/18 0544  Weight: 113.4 kg 113 kg 111.6 kg    Examination:   General: Not in pain or dyspnea, deconditioned  Neurology: Awake and alert, non focal  E ENT: no pallor, no icterus, oral mucosa moist Cardiovascular: No JVD. S1-S2 present, rhythmic, no gallops, rubs, or murmurs. No lower extremity edema. Pulmonary: vesicular breath sounds bilaterally, adequate air movement, no wheezing, rhonchi or rales. Gastrointestinal. Abdomen protuberant no organomegaly, non tender, no rebound or guarding Skin. No rashes Musculoskeletal: no joint deformities     Data Reviewed: I have personally reviewed following labs and imaging studies  CBC: Recent Labs  Lab 08/03/18 2036 08/05/18 0427  WBC 18.0* 10.2  HGB 14.1 12.8*  HCT 46.6 40.7  MCV 92.6 91.9  PLT 275 215   Basic Metabolic Panel: Recent Labs  Lab 08/03/18 2036  NA 134*  K 3.6  CL 106  CO2 20*  GLUCOSE 159*  BUN 18  CREATININE 1.07  CALCIUM 9.0   GFR: Estimated Creatinine Clearance: 87.2 mL/min (by C-G formula based on SCr of 1.07 mg/dL). Liver Function Tests: No results for input(s): AST, ALT, ALKPHOS, BILITOT, PROT, ALBUMIN in the last 168 hours. No results for input(s): LIPASE, AMYLASE in the last 168 hours. No results for input(s): AMMONIA in the last 168 hours. Coagulation Profile: No results for input(s): INR,  PROTIME in the last 168 hours. Cardiac Enzymes: Recent Labs  Lab 08/03/18 2345 08/04/18 0958  TROPONINI 0.26* 0.26*   BNP (last 3 results) No results for input(s): PROBNP in the last 8760 hours. HbA1C: Recent Labs    08/04/18 0559  HGBA1C 6.3*   CBG: Recent Labs  Lab 08/03/18 2048 08/04/18 0052  GLUCAP 139* 107*   Lipid Profile: Recent Labs    08/04/18 0559  CHOL 95  HDL 31*  LDLCALC 47  TRIG 84  CHOLHDL 3.1   Thyroid Function Tests: No results for input(s): TSH, T4TOTAL, FREET4, T3FREE, THYROIDAB in the last 72 hours. Anemia Panel: No results for input(s): VITAMINB12, FOLATE, FERRITIN, TIBC, IRON, RETICCTPCT in the last 72 hours.    Radiology Studies: I have reviewed all of the imaging during this hospital visit personally     Scheduled Meds: . aspirin  81 mg Oral Daily  . atorvastatin  40 mg Oral QHS  . carbidopa-levodopa  1.5 tablet Oral QID  . chlorhexidine  5 mL Mouth/Throat QHS  . donepezil  10 mg Oral QHS  . enoxaparin (LOVENOX) injection  40 mg Subcutaneous Q24H  . lisinopril  10 mg Oral Daily  . metoprolol succinate  100 mg Oral Daily  . rOPINIRole  0.25 mg Oral Daily  . senna-docusate  1 tablet Oral QODAY   Continuous Infusions: . sodium chloride 10 mL/hr at 08/05/18 1529     LOS: 2 days        Coralie KeensMauricio Daniel Arrien, MD [facility] Pager [phone]

## 2018-08-06 NOTE — Plan of Care (Signed)

## 2018-08-06 NOTE — Plan of Care (Signed)
°  Problem: Coping: °Goal: Level of anxiety will decrease °Outcome: Progressing °  °

## 2018-08-06 NOTE — Progress Notes (Signed)
Occupational Therapy Treatment Patient Details Name: Alan Wells MRN: [id number] DOB: Jun 22, 1956 Today's Date: 08/06/2018    History of present illness Pt is a 62 y.o. male who was admitted after a fall at assisted living facility. PMH includes Parkinson's, cognitive impairment, HTN, light headedness, CAD, DM. CT scan revealed no abnormalities. CXR showed left basilar atelectasis. No c/o of LOC.    OT comments  Pt eager to get OOB and work with OT on ADL seated at sink. Min assist for UB, mod to max for LB due to poor standing balance and difficulty reaching his feet. Pt remained up in chair with alarm set and max in front with call bell and urinal. Progressing well.  Follow Up Recommendations  SNF;Supervision/Assistance - 24 hour    Equipment Recommendations       Recommendations for Other Services      Precautions / Restrictions Precautions Precautions: Fall       Mobility Bed Mobility Overal bed mobility: Needs Assistance Bed Mobility: Supine to Sit     Supine to sit: Supervision;HOB elevated     General bed mobility comments: for safety  Transfers Overall transfer level: Needs assistance Equipment used: Rolling walker (2 wheeled) Transfers: Sit to/from Stand Sit to Stand: Min assist         General transfer comment: assist to rise and steady    Balance Overall balance assessment: Needs assistance;History of Falls   Sitting balance-Leahy Scale: Fair       Standing balance-Leahy Scale: Poor Standing balance comment: Reliant on BUEs for support in standing and external support due to posterior bias/lean.                           ADL either performed or assessed with clinical judgement   ADL Overall ADL's : Needs assistance/impaired     Grooming: Oral care;Wash/dry hands;Wash/dry face;Sitting;Set up   Upper Body Bathing: Minimal assistance;Sitting Upper Body Bathing Details (indicate cue type and reason): for back Lower Body Bathing:  Moderate assistance;Sit to/from stand   Upper Body Dressing : Minimal assistance;Sitting Upper Body Dressing Details (indicate cue type and reason): for socks Lower Body Dressing: Maximal assistance;Sitting/lateral leans   Toilet Transfer: Minimal assistance;Ambulation;RW;BSC   Toileting- Clothing Manipulation and Hygiene: Sitting/lateral lean;Moderate assistance;Sit to/from stand       Functional mobility during ADLs: Minimal assistance;Rolling walker       Vision       Perception     Praxis      Cognition Arousal/Alertness: Awake/alert Behavior During Therapy: WFL for tasks assessed/performed Overall Cognitive Status: History of cognitive impairments - at baseline                                 General Comments: poor safety awareness        Exercises     Shoulder Instructions       General Comments      Pertinent Vitals/ Pain       Pain Assessment: No/denies pain  Home Living                                          Prior Functioning/Environment              Frequency  Min 2X/week  Progress Toward Goals  OT Goals(current goals can now be found in the care plan section)  Progress towards OT goals: Progressing toward goals  Acute Rehab OT Goals Patient Stated Goal: to watch impeachment hearing OT Goal Formulation: With patient Time For Goal Achievement: 08/18/18 Potential to Achieve Goals: Good  Plan Discharge plan remains appropriate    Co-evaluation                 AM-PAC PT "6 Clicks" Daily Activity     Outcome Measure   Help from another person eating meals?: None Help from another person taking care of personal grooming?: None Help from another person toileting, which includes using toliet, bedpan, or urinal?: A Lot Help from another person bathing (including washing, rinsing, drying)?: A Lot Help from another person to put on and taking off regular upper body clothing?: A  Little Help from another person to put on and taking off regular lower body clothing?: A Lot 6 Click Score: 17    End of Session Equipment Utilized During Treatment: Gait belt;Rolling walker  OT Visit Diagnosis: Unsteadiness on feet (R26.81);Other abnormalities of gait and mobility (R26.89);Muscle weakness (generalized) (M62.81);Other symptoms and signs involving cognitive function   Activity Tolerance Patient tolerated treatment well   Patient Left in chair;with call bell/phone within reach;with chair alarm set   Nurse Communication Mobility status;Other (comment)(aware of urine output and condom cath off)        Time: 1011-1040 OT Time Calculation (min): 29 min  Charges: OT General Charges $OT Visit: 1 Visit OT Treatments $Self Care/Home Management : 23-37 mins  Martie RoundJulie Carlson Belland, OTR/L Acute Rehabilitation Services Pager: [phone] Office: [phone]   Evern BioMayberry, Derward Marple Lynn 08/06/2018, 10:58 AM

## 2018-08-06 NOTE — Clinical Social Work Note (Addendum)
[facility] thought patient had BCBS Medicare. Notified them that he has [employer] commercial insurance. They will start insurance authorization now.  Charlynn CourtSarah Eyoel Throgmorton, CSW [phone]  11:40 am Per SNF admissions coordinator, patient will have a 40% copay for any facility he goes to. She is getting an amount for CSW to provide patient and brother.  Charlynn CourtSarah Moustafa Mossa, CSW [phone]  12:48 pm Per SNF admissions coordinator, it is hard to determine exact cost of copay per day because they do not know what therapy level they will give to bill at. Room and board are approximately $172 per day. This does not include therapy costs. Patient and brother are aware and agreeable.  Charlynn CourtSarah Prisca Gearing, CSW [phone]

## 2018-08-07 NOTE — Progress Notes (Signed)
Patient was discharge to [facility].  Patient was alert and oriented at baseline, denied chest pain or shortness of breath. Peripheral IV was removed, clean dry and intact, pressure and dressing applied.  Sent with belonging bag with clothes.  Called to give report to nurse Christina D. From [facility] facility.  Education was given to patient, patient responded well to teaching, patient denied questions or concerns.

## 2018-08-07 NOTE — Clinical Social Work Note (Addendum)
Insurance authorization still pending.  Charlynn CourtSarah Makenna Macaluso, CSW [phone]  2:21 pm Insurance authorization approved. Reference # [id number]. MD and patient's brother aware.  Charlynn CourtSarah Cheyeanne Roadcap, CSW [phone]

## 2018-08-07 NOTE — Clinical Social Work Placement (Signed)
   CLINICAL SOCIAL WORK PLACEMENT  NOTE  Date:  08/07/2018  Patient Details  Name: Alan BailiffHarry Voigt MRN: [id number] Date of Birth: 1956-02-13  Clinical Social Work is seeking post-discharge placement for this patient at the Skilled  Nursing Facility level of care (*CSW will initial, date and re-position this form in  chart as items are completed):  Yes   Patient/family provided with [facility] Clinical Social Work Department's list of facilities offering this level of care within the geographic area requested by the patient (or if unable, by the patient's family).  Yes   Patient/family informed of their freedom to choose among providers that offer the needed level of care, that participate in Medicare, Medicaid or managed care program needed by the patient, have an available bed and are willing to accept the patient.  Yes   Patient/family informed of [facility]'s ownership interest in [facility] and [facility], as well as of the fact that they are under no obligation to receive care at these facilities.  PASRR submitted to EDS on 08/04/18     PASRR number received on 08/05/18     Existing PASRR number confirmed on       FL2 transmitted to all facilities in geographic area requested by pt/family on 08/05/18     FL2 transmitted to all facilities within larger geographic area on       Patient informed that his/her managed care company has contracts with or will negotiate with certain facilities, including the following:        Yes   Patient/family informed of bed offers received.  Patient chooses bed at [facility]     Physician recommends and patient chooses bed at      Patient to be transferred to [facility] on 08/07/18.  Patient to be transferred to facility by [facility]     Patient family notified on 08/07/18 of transfer.  Name of family member notified:  Danella PentonFrank Deboy     PHYSICIAN Please prepare prescriptions     Additional Comment:     _______________________________________________ Margarito LinerSarah C Sommer Spickard, LCSW 08/07/2018, 2:27 PM

## 2018-08-07 NOTE — Progress Notes (Signed)
RN attempted to call pt's brother to notify him that transport is taking pt to nursing facility.

## 2018-08-07 NOTE — Progress Notes (Signed)
Physical Therapy Treatment Patient Details Name: Alan BailiffHarry Mees MRN: [id number] DOB: Jan 08, 1956 Today's Date: 08/07/2018    History of Present Illness Pt is a 62 y.o. male who was admitted after a fall at assisted living facility. PMH includes Parkinson's, cognitive impairment, HTN, light headedness, CAD, DM. CT scan revealed no abnormalities. CXR showed left basilar atelectasis. No c/o of LOC.     PT Comments    Patient progressing well towards PT goals. Continues to require assist to stand from all surfaces. Improved ambulation distance today but requires Min A for balance due to unsteadiness with tendency to list left. Pt high fall risk (found on knees this week after trying to transfer by himself back to bed). Dizziness present initially but resolved with ambulation. Even though pt's mobility is improving, continue to recommend SNF due to poor safety awareness, impulsivity and imbalance. Will follow.     Follow Up Recommendations  SNF;Supervision/Assistance - 24 hour     Equipment Recommendations  None recommended by PT    Recommendations for Other Services       Precautions / Restrictions Precautions Precautions: Fall Restrictions Weight Bearing Restrictions: No    Mobility  Bed Mobility Overal bed mobility: Needs Assistance Bed Mobility: Supine to Sit     Supine to sit: Min guard;HOB elevated     General bed mobility comments: Min guard for safety, some difficulty to elevate trunk with increased time. Dizziness.   Transfers Overall transfer level: Needs assistance Equipment used: Rolling walker (2 wheeled) Transfers: Sit to/from Stand Sit to Stand: Min assist         General transfer comment: assist to rise and steady, Dizziness. Transferred to chair post ambulation.  Ambulation/Gait Ambulation/Gait assistance: Min assist Gait Distance (Feet): 125 Feet Assistive device: Rolling walker (2 wheeled) Gait Pattern/deviations: Step-through pattern;Decreased  stride length;Shuffle;Trunk flexed;Staggering left     General Gait Details: Slow, shuffling like gait veering towards left; cues for RW management, constant Min A for balance. Poor safety awareness.   Stairs             Wheelchair Mobility    Modified Rankin (Stroke Patients Only)       Balance                                            Cognition Arousal/Alertness: Awake/alert Behavior During Therapy: Impulsive;WFL for tasks assessed/performed Overall Cognitive Status: History of cognitive impairments - at baseline                                 General Comments: poor safety awareness      Exercises      General Comments        Pertinent Vitals/Pain Pain Assessment: No/denies pain    Home Living                      Prior Function            PT Goals (current goals can now be found in the care plan section) Progress towards PT goals: Progressing toward goals    Frequency    Min 2X/week      PT Plan Current plan remains appropriate    Co-evaluation              AM-PAC PT "6 Clicks" Daily  Activity  Outcome Measure  Difficulty turning over in bed (including adjusting bedclothes, sheets and blankets)?: None Difficulty moving from lying on back to sitting on the side of the bed? : A Lot Difficulty sitting down on and standing up from a chair with arms (e.g., wheelchair, bedside commode, etc,.)?: Unable Help needed moving to and from a bed to chair (including a wheelchair)?: A Little Help needed walking in hospital room?: A Little Help needed climbing 3-5 steps with a railing? : A Lot 6 Click Score: 15    End of Session Equipment Utilized During Treatment: Gait belt Activity Tolerance: Patient tolerated treatment well Patient left: in chair;with call bell/phone within reach;with chair alarm set Nurse Communication: Mobility status PT Visit Diagnosis: Muscle weakness (generalized)  (M62.81);Difficulty in walking, not elsewhere classified (R26.2);Repeated falls (R29.6)     Time: 4098-1191 PT Time Calculation (min) (ACUTE ONLY): 21 min  Charges:  $Gait Training: 8-22 mins                     Mylo Red, PT, DPT Acute Rehabilitation Services Pager [phone] Office [phone]       Blake Divine A Lanier Ensign 08/07/2018, 9:22 AM

## 2018-08-07 NOTE — Clinical Social Work Note (Signed)
CSW facilitated patient discharge including contacting patient family and facility to confirm patient discharge plans. Clinical information faxed to facility and family agreeable with plan. CSW arranged ambulance transport via [facility] to [facility]. RN to call report prior to discharge ([phone] Room 101A).  CSW will sign off for now as social work intervention is no longer needed. Please consult us again if new needs arise.  Mykel Mohl, CSW [phone]  

## 2018-08-08 ENCOUNTER — Telehealth: Payer: Self-pay

## 2018-08-08 NOTE — Telephone Encounter (Signed)
Transition Care Management Follow-up Telephone Call  Date of discharge and from where: [facility] on 08/05/18  How have you been since you were released from the hospital? Feeling fine however he fell this morning again with no injury  Any questions or concerns? No   Items Reviewed:  Did the pt receive and understand the discharge instructions provided? Yes   Medications obtained and verified? Yes   Any new allergies since your discharge? No   Dietary orders reviewed? Yes  Do you have support at home? Yes , [facility]  Other (ie: DME, Home Health, etc) N/A  Functional Questionnaire: (I = Independent and D = Dependent) ADL's: I w assist  Bathing/Dressing- I w/ assist   Meal Prep- D  Eating- I  Maintaining continence- I  Transferring/Ambulation- I w/ assist of walker  Managing Meds- I w/ assist   Follow up appointments reviewed:    PCP Hospital f/u appt confirmed? Yes  [name], NP will complete TCM visit  Specialist Hospital f/u appt confirmed? 12/07/2017 with Dr. Royann Shiversroitoru  Are transportation arrangements needed? No   If their condition worsens, is the pt aware to call  their PCP or go to the ED? Yes  Was the patient provided with contact information for the PCP's office or ED? Yes  Was the pt encouraged to call back with questions or concerns? Yes

## 2018-08-09 LAB — CULTURE, BLOOD (ROUTINE X 2)
CULTURE: NO GROWTH
Culture: NO GROWTH

## 2018-08-28 ENCOUNTER — Non-Acute Institutional Stay: Payer: BLUE CROSS/BLUE SHIELD | Admitting: Family Medicine

## 2018-08-28 ENCOUNTER — Encounter: Payer: Self-pay | Admitting: Family Medicine

## 2018-08-28 DIAGNOSIS — E7800 Pure hypercholesterolemia, unspecified: Secondary | ICD-10-CM

## 2018-08-28 DIAGNOSIS — I214 Non-ST elevation (NSTEMI) myocardial infarction: Secondary | ICD-10-CM | POA: Diagnosis not present

## 2018-08-28 DIAGNOSIS — R1312 Dysphagia, oropharyngeal phase: Secondary | ICD-10-CM

## 2018-08-28 DIAGNOSIS — E119 Type 2 diabetes mellitus without complications: Secondary | ICD-10-CM

## 2018-08-28 DIAGNOSIS — R4189 Other symptoms and signs involving cognitive functions and awareness: Secondary | ICD-10-CM

## 2018-08-28 DIAGNOSIS — I10 Essential (primary) hypertension: Secondary | ICD-10-CM

## 2018-08-28 DIAGNOSIS — R2681 Unsteadiness on feet: Secondary | ICD-10-CM

## 2018-08-28 DIAGNOSIS — W19XXXA Unspecified fall, initial encounter: Secondary | ICD-10-CM

## 2018-08-28 NOTE — Progress Notes (Signed)
Provider:  Jacalyn Lefevre, MD Location:      Place of Service:     PCP: [name], [name], NP Patient Care Team: [name], [name], NP as PCP - General (Internal Medicine)  Extended Emergency Contact Information Primary Emergency Contact: Kirker,Frank Address: [address]          [place], [place] [address] D[place] of [place] Home Phone: [phone] Mobile Phone: [phone] Relation: Brother  Code Status: DNR Goals of Care: Advanced Directive information Advanced Directives 08/28/2018  Does Patient Have a Medical Advance Directive? Yes  Type of Estate agent of [place];Out of facility DNR (pink MOST or yellow form)  Does patient want to make changes to medical advance directive? No - Patient declined  Copy of Healthcare Power of Attorney in Chart? -  Would patient like information on creating a medical advance directive? -  Pre-existing out of facility DNR order (yellow form or pink MOST form) Yellow form placed in chart (order not valid for inpatient use)      Chief Complaint  Patient presents with  . New Admit To SNF    new admit to facility     HPI: Patient is a 62 y.o. male seen today for admission to assisted living from [facility] facility.  He was admitted there on November 21 after a hospital stay from November 17.  He had previously been living in assisted living at [facility].  He was admitted after a fall and near syncope.  Emergency room evaluation showed some elevated troponins and abnormal EKG.  He had a negative CT scan but he was admitted to telemetry and seen in consultation by cardiology.  He was discharged to skilled nursing for rehab with a broad-based gait and balance issues.  He does have a history of significant Parkinson's disease as well as hypertension hyperlipidemia diet-controlled diabetes depression coronary artery disease and cognitive impairment.  He had had 3 falls in the week prior to this  hospitalization.  Past Medical History:  Diagnosis Date  . Adult onset fluency disorder   . Coronary artery disease    a. s/p stent left anterior descending artery remotely (per 2017 note, "about 4 years" prior). b. Normal nuc 05/2015.  . Diabetes mellitus type 2, controlled (HCC) 01/18/2016  . Dysphagia 2018  . Gait instability 2018  . History of falling   . Hypercholesterolemia   . Hypertension   . Memory change 2018  . Parkinson's disease (HCC) 2018  . Weight loss 2017   intentional   Past Surgical History:  Procedure Laterality Date  . CAROTID STENT  2012   in left anterior descending artery past EF 55%  . nuclear stress test  06/17/2015   Gated SPECT images reveal normal LV systolic function. EF 55%. Comparing the rest & stress SPECT images, no ischemia. Gated images are normal. Dr. Vernell Barrier, MD [place], [place]     reports that he has never smoked. He has never used smokeless tobacco. He reports that he does not drink alcohol or use drugs. Social History   Socioeconomic History  . Marital status: Single    Spouse name: Not on file  . Number of children: Not on file  . Years of education: Not on file  . Highest education level: Not on file  Occupational History  . Occupation:  Engineer, maintenance  Social Needs  . Financial resource strain: Not on file  . Food insecurity:    Worry: Not on file  Inability: Not on file  . Transportation needs:    Medical: Not on file    Non-medical: Not on file  Tobacco Use  . Smoking status: Never Smoker  . Smokeless tobacco: Never Used  Substance and Sexual Activity  . Alcohol use: No  . Drug use: No  . Sexual activity: Never  Lifestyle  . Physical activity:    Days per week: Not on file    Minutes per session: Not on file  . Stress: Not on file  Relationships  . Social connections:    Talks on phone: Not on file    Gets together: Not on file    Attends religious service: Not on file    Active member of club or  organization: Not on file    Attends meetings of clubs or organizations: Not on file    Relationship status: Not on file  . Intimate partner violence:    Fear of current or ex partner: Not on file    Emotionally abused: Not on file    Physically abused: Not on file    Forced sexual activity: Not on file  Other Topics Concern  . Not on file  Social History Narrative   Moved [place] [facility] [place]   Single   Never smoked   Alcohol none    [employer] with walker    Functional Status Survey:    Family History  Problem Relation Age of Onset  . Cancer Mother   . Heart disease Mother     Health Maintenance  Topic Date Due  . Hepatitis C Screening  1955/11/08  . COLONOSCOPY  06/03/2004  . OPHTHALMOLOGY EXAM  10/08/2017  . FOOT EXAM  01/21/2018  . HEMOGLOBIN A1C  02/02/2019  . TETANUS/TDAP  09/17/2026  . INFLUENZA VACCINE  Completed  . HIV Screening  Completed    Allergies  Allergen Reactions  . Codeine     Outpatient Encounter Medications as of 08/28/2018  Medication Sig  . aspirin 81 MG chewable tablet Chew 81 mg by mouth daily.   [name] atorvastatin (LIPITOR) 40 MG tablet Take 40 mg by mouth at bedtime. \  . carbidopa-levodopa (SINEMET IR) 25-100 MG tablet Take 1.5 tablets by mouth 4 (four) times daily. Take 1 1/2 tablet  . chlorhexidine (PERIDEX) 0.12 % solution Use as directed 5 mLs in the mouth or throat at bedtime.  . donepezil (ARICEPT) 10 MG tablet Take 10 mg by mouth at bedtime.  [name] lisinopril (PRINIVIL,ZESTRIL) 10 MG tablet Take 10 mg by mouth daily.   . metoprolol succinate (TOPROL-XL) 100 MG 24 hr tablet Take 100 mg by mouth daily.   . NON FORMULARY Hemp oil -4 sprays sublingual once a day  . rOPINIRole (REQUIP) 0.25 MG tablet Take 0.25 mg by mouth daily.   . sennosides-docusate sodium (SENOKOT-S) 8.6-50 MG tablet Take 1 tablet by mouth every other day.    No facility-administered encounter medications on file as of 08/28/2018.      Review of Systems  Constitutional: Negative.   HENT: Positive for trouble swallowing.   Respiratory: Negative.   Cardiovascular: Negative.   Musculoskeletal: Positive for gait problem.  Neurological: Positive for tremors.  Psychiatric/Behavioral: Positive for confusion.    Vitals:   08/28/18 1156  BP: 128/70  Pulse: 88  Resp: 18  Temp: (!) 97.1 F (36.2 C)  SpO2: 96%  Weight: 250 lb 11.2 oz (113.7 kg)  Height: 5\' 11"  (1.803 m)   Body mass  index is 34.97 kg/m. Physical Exam Vitals signs and nursing note reviewed.  Constitutional:      Appearance: Normal appearance. He is normal weight.     Comments: Obvious mask like facies  HENT:     Head: Normocephalic and atraumatic.     Nose: Nose normal.     Mouth/Throat:     Mouth: Mucous membranes are moist.  Neck:     Musculoskeletal: Normal range of motion and neck supple.  Cardiovascular:     Rate and Rhythm: Normal rate and regular rhythm.  Neurological:     Mental Status: He is alert.     Labs reviewed: Basic Metabolic Panel: Recent Labs    12/03/17 06/10/18 08/03/18 2036  NA 138 140 134*  K 3.9 4.1 3.6  CL  --  106 106  CO2  --  28 20*  GLUCOSE  --   --  159*  BUN 15 14 18   CREATININE 0.8 0.8 1.07  CALCIUM  --  9.1 9.0   Liver Function Tests: Recent Labs    12/03/17 06/10/18  AST 13* 12*  ALT 17 18  ALKPHOS 77 89  PROT  --  6.3  ALBUMIN  --  4.1   No results for input(s): LIPASE, AMYLASE in the last 8760 hours. No results for input(s): AMMONIA in the last 8760 hours. CBC: Recent Labs    06/10/18 08/03/18 2036 08/05/18 0427  WBC 6.7 18.0* 10.2  HGB 13.8 14.1 12.8*  HCT 42 46.6 40.7  MCV  --  92.6 91.9  PLT 210 275 215   Cardiac Enzymes: Recent Labs    08/03/18 2345 08/04/18 0958  TROPONINI 0.26* 0.26*   BNP: Invalid input(s): POCBNP Lab Results  Component Value Date   HGBA1C 6.3 (H) 08/04/2018   Lab Results  Component Value Date   TSH 1.70 12/03/2017   No results found  for: VITAMINB12 No results found for: FOLATE No results found for: IRON, TIBC, FERRITIN  Imaging and Procedures obtained prior to SNF admission: Dg Chest 2 View  Result Date: 08/03/2018 CLINICAL DATA:  Weakness EXAM: CHEST - 2 VIEW COMPARISON:  None FINDINGS: Heart is normal size. Left base atelectasis. Right lung clear. No effusions. Heart is normal size. No acute bony abnormality. IMPRESSION: Left basilar atelectasis. Electronically Signed   By: Charlett Nose M.D.   On: 08/03/2018 21:15   Ct Head Wo Contrast  Result Date: 08/03/2018 CLINICAL DATA:  Fall. EXAM: CT HEAD WITHOUT CONTRAST TECHNIQUE: Contiguous axial images were obtained from the base of the skull through the vertex without intravenous contrast. COMPARISON:  May 31, 2017 FINDINGS: Brain: No subdural, epidural, or subarachnoid hemorrhage. Ventricles and sulci are unremarkable. Cerebellum, brainstem, and basal cisterns are normal. No mass effect or midline shift. No acute cortical ischemia or infarct. Vascular: Calcified atherosclerosis in the intracranial carotids. Skull: Normal. Negative for fracture or focal lesion. Sinuses/Orbits: Mucosal thickening in the maxillary sinuses, right greater than left. No air-fluid levels. Paranasal sinuses, mastoid air cells, and middle ears are otherwise unremarkable. Other: None. IMPRESSION: 1. No acute intracranial abnormalities noted. Electronically Signed   By: Gerome Sam III M.D   On: 08/03/2018 21:45    Assessment/Plan 1. NSTEMI (non-ST elevated myocardial infarction) [facility]) Patient had elevated troponins and white count upon admission to the hospital.  There was no further intervention and he does not have ongoing chest pain.  2. Essential hypertension Blood pressures are managed with metoprolol and lisinopril.  Recent pressure was 136/80  3.  Controlled type 2 diabetes mellitus without complication, without long-term current use of insulin (HCC) On no medications.  Most recent  A1c was 6.3 approximately 3 months ago  4. Oropharyngeal dysphagia Patient has been schooled in swallowing techniques with proper chewing and taking liquids after each bite.  There is been no history of aspiration  5. Cognitive impairment Patient does take Aricept.  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  6. Hypercholesterolemia Currently takes atorvastatin 40 mg.  Last LDL approximately 3 months ago was 37  7. Unsteady gait Falls will continue to be a problem with his Parkinson's.  May benefit from ongoing therapy for balance     Family/ staff Communication: Findings communicated to patient and nursing staff  Labs/tests ordered:  Bertram MillardStephen M. Hyacinth MeekerMiller, MD [facility] [address] [place], [place] [address] Office [phone]

## 2018-09-15 ENCOUNTER — Other Ambulatory Visit: Payer: Self-pay | Admitting: *Deleted

## 2018-09-15 LAB — CBC AND DIFFERENTIAL
HCT: 39 — AB (ref 41–53)
HEMOGLOBIN: 12.8 — AB (ref 13.5–17.5)
Platelets: 196 (ref 150–399)
WBC: 6.6

## 2018-09-15 LAB — BASIC METABOLIC PANEL
BUN: 15 (ref 4–21)
CREATININE: 0.7 (ref <=1.3)
Glucose: 109
POTASSIUM: 4.2 (ref 3.4–5.3)
SODIUM: 140 (ref 137–147)

## 2018-09-15 LAB — COMPLETE METABOLIC PANEL WITH GFR
CHLORIDE: 104
CO2: 25
Calcium: 8.8
GFR CALC NON AF AMER: 100

## 2018-10-04 ENCOUNTER — Emergency Department (HOSPITAL_COMMUNITY): Payer: BLUE CROSS/BLUE SHIELD

## 2018-10-04 ENCOUNTER — Emergency Department (HOSPITAL_COMMUNITY)
Admission: EM | Admit: 2018-10-04 | Discharge: 2018-10-04 | Disposition: A | Discharge status: Home / Self Care | Payer: BLUE CROSS/BLUE SHIELD | Attending: Emergency Medicine | Admitting: Emergency Medicine

## 2018-10-04 ENCOUNTER — Encounter (HOSPITAL_COMMUNITY): Payer: Self-pay

## 2018-10-04 ENCOUNTER — Other Ambulatory Visit: Payer: Self-pay

## 2018-10-04 DIAGNOSIS — E119 Type 2 diabetes mellitus without complications: Secondary | ICD-10-CM | POA: Insufficient documentation

## 2018-10-04 DIAGNOSIS — W01198A Fall on same level from slipping, tripping and stumbling with subsequent striking against other object, initial encounter: Secondary | ICD-10-CM | POA: Diagnosis not present

## 2018-10-04 DIAGNOSIS — S0990XA Unspecified injury of head, initial encounter: Secondary | ICD-10-CM | POA: Diagnosis present

## 2018-10-04 DIAGNOSIS — I2510 Atherosclerotic heart disease of native coronary artery without angina pectoris: Secondary | ICD-10-CM | POA: Diagnosis not present

## 2018-10-04 DIAGNOSIS — G20 Parkinson's disease: Secondary | ICD-10-CM | POA: Insufficient documentation

## 2018-10-04 DIAGNOSIS — I10 Essential (primary) hypertension: Secondary | ICD-10-CM | POA: Insufficient documentation

## 2018-10-04 DIAGNOSIS — Z7982 Long term (current) use of aspirin: Secondary | ICD-10-CM | POA: Diagnosis not present

## 2018-10-04 DIAGNOSIS — Y9389 Activity, other specified: Secondary | ICD-10-CM | POA: Diagnosis not present

## 2018-10-04 DIAGNOSIS — Z79899 Other long term (current) drug therapy: Secondary | ICD-10-CM | POA: Diagnosis not present

## 2018-10-04 DIAGNOSIS — Y92009 Unspecified place in unspecified non-institutional (private) residence as the place of occurrence of the external cause: Secondary | ICD-10-CM | POA: Insufficient documentation

## 2018-10-04 DIAGNOSIS — Y998 Other external cause status: Secondary | ICD-10-CM | POA: Insufficient documentation

## 2018-10-04 DIAGNOSIS — I252 Old myocardial infarction: Secondary | ICD-10-CM | POA: Insufficient documentation

## 2018-10-04 DIAGNOSIS — S0101XA Laceration without foreign body of scalp, initial encounter: Secondary | ICD-10-CM | POA: Diagnosis not present

## 2018-10-04 DIAGNOSIS — W19XXXA Unspecified fall, initial encounter: Secondary | ICD-10-CM

## 2018-10-04 DIAGNOSIS — Z8669 Personal history of other diseases of the nervous system and sense organs: Secondary | ICD-10-CM

## 2018-10-04 MED ORDER — LIDOCAINE HCL (PF) 1 % IJ SOLN
5.0000 mL | Freq: Once | INTRAMUSCULAR | Status: AC
  Administered 2018-10-04: 5 mL via INTRADERMAL
  Filled 2018-10-04: qty 5

## 2018-10-04 NOTE — ED Notes (Signed)
Assisted pt using urinal

## 2018-10-04 NOTE — ED Notes (Signed)
ED Provider at bedside. 

## 2018-10-04 NOTE — ED Provider Notes (Signed)
[facility] EMERGENCY DEPARTMENT Provider Note   CSN: [id number] Arrival date & time: 10/04/18  5621     History   Chief Complaint Chief Complaint  Patient presents with  . Fall    HPI Drummond Hankes is a 63 y.o. male.  Patient is a 63 year old male with history of Parkinson's disease.  He presents today for evaluation of fall.  He has a history of frequent falls related to his Parkinson's.  He apparently got up this morning, fell forward, and hit his head on the wall.  He has a laceration to the right parietal region.  He denies any loss of consciousness or neck pain.  He denies other injury in the fall.  The history is provided by the patient.  Fall  This is a new problem. The current episode started less than 1 hour ago. The problem occurs constantly. The problem has not changed since onset.Pertinent negatives include no chest pain, no headaches and no shortness of breath. Nothing aggravates the symptoms. Nothing relieves the symptoms. He has tried nothing for the symptoms.    Past Medical History:  Diagnosis Date  . Adult onset fluency disorder   . Coronary artery disease    a. s/p stent left anterior descending artery remotely (per 2017 note, "about 4 years" prior). b. Normal nuc 05/2015.  . Diabetes mellitus type 2, controlled (HCC) 01/18/2016  . Dysphagia 2018  . Gait instability 2018  . History of falling   . Hypercholesterolemia   . Hypertension   . Memory change 2018  . Parkinson's disease (HCC) 2018  . Weight loss 2017   intentional    Patient Active Problem List   Diagnosis Date Noted  . Elevated troponin 08/03/2018  . Leukocytosis 08/03/2018  . Parkinson's disease (HCC) 11/29/2017  . Constipation 06/03/2017  . Fall 06/03/2017  . Obesity (BMI 30-39.9) 05/10/2017  . Insomnia 03/19/2017  . NSTEMI (non-ST elevated myocardial infarction) (HCC)   . Hypertension   . Hypercholesterolemia   . 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  Cognitive impairment 09/17/2016  . Unsteady gait 09/17/2016  . Dysphagia 09/17/2016  . Diabetes mellitus type 2, controlled (HCC) 01/18/2016  . Coronary artery disease 09/17/2014    Past Surgical History:  Procedure Laterality Date  . CAROTID STENT  2012   in left anterior descending artery past EF 55%  . nuclear stress test  06/17/2015   Gated SPECT images reveal normal LV systolic function. EF 55%. Comparing the rest & stress SPECT images, no ischemia. Gated images are normal. Dr. Vernell Barrier, MD [place], [place]         Home Medications    Prior to Admission medications   Medication Sig Start Date End Date Taking? Authorizing Provider  aspirin 81 MG chewable tablet Chew 81 mg by mouth daily.     [provider]  atorvastatin (LIPITOR) 40 MG tablet Take 40 mg by mouth at bedtime. \    [provider]  carbidopa-levodopa (SINEMET IR) 25-100 MG tablet Take 1.5 tablets by mouth 4 (four) times daily. Take 1 1/2 tablet    [provider]  chlorhexidine (PERIDEX) 0.12 % solution Use as directed 5 mLs in the mouth or throat at bedtime.    [provider]  donepezil (ARICEPT) 10 MG tablet Take 10 mg by mouth at bedtime.    [provider]  lisinopril (PRINIVIL,ZESTRIL) 10 MG tablet Take 10 mg by mouth daily.     [provider]  metoprolol  succinate (TOPROL-XL) 100 MG 24 hr tablet Take 100 mg by mouth daily.     [provider]  NON FORMULARY Hemp oil -4 sprays sublingual once a day    [provider]  rOPINIRole (REQUIP) 0.25 MG tablet Take 0.25 mg by mouth daily.     [provider]  sennosides-docusate sodium (SENOKOT-S) 8.6-50 MG tablet Take 1 tablet by mouth every other day.     [provider]    Family History Family History  Problem Relation Age of Onset  . Cancer Mother   . Heart disease Mother     Social History Social History   Tobacco Use  . Smoking status: Never Smoker  .  Smokeless tobacco: Never Used  Substance Use Topics  . Alcohol use: No  . Drug use: No     Allergies   Codeine   Review of Systems Review of Systems  Respiratory: Negative for shortness of breath.   Cardiovascular: Negative for chest pain.  Neurological: Negative for headaches.  All other systems reviewed and are negative.    Physical Exam Updated Vital Signs BP 113/64 (BP Location: Right Arm)   Pulse (!) 56   Temp 98.1 F (36.7 C) (Oral)   Resp 20   SpO2 96%   Physical Exam Vitals signs and nursing note reviewed.  Constitutional:      General: He is not in acute distress.    Appearance: Normal appearance. He is well-developed. He is not ill-appearing or diaphoretic.  HENT:     Head: Normocephalic.     Comments: There is a 2.5 cm laceration to the right parietal region.  Bleeding is controlled. Neck:     Musculoskeletal: Normal range of motion and neck supple.     Comments: There is no cervical tenderness or step-offs.  He has painless range of motion in all directions. Cardiovascular:     Rate and Rhythm: Normal rate and regular rhythm.     Heart sounds: No murmur. No friction rub.  Pulmonary:     Effort: Pulmonary effort is normal. No respiratory distress.     Breath sounds: Normal breath sounds. No wheezing or rales.  Abdominal:     General: Bowel sounds are normal. There is no distension.     Palpations: Abdomen is soft.     Tenderness: There is no abdominal tenderness.  Musculoskeletal: Normal range of motion.  Skin:    General: Skin is warm and dry.  Neurological:     General: No focal deficit present.     Mental Status: He is alert and oriented to person, place, and time.     Cranial Nerves: No cranial nerve deficit.     Sensory: No sensory deficit.     Motor: No weakness.     Coordination: Coordination normal.      ED Treatments / Results  Labs (all labs ordered are listed, but only abnormal results are displayed) Labs Reviewed - No data to  display  EKG None  Radiology No results found.  Procedures Procedures (including critical care time)  Medications Ordered in ED Medications  lidocaine (PF) (XYLOCAINE) 1 % injection 5 mL (has no administration in time range)     Initial Impression / Assessment and Plan / ED Course  I have reviewed the triage vital signs and the nursing notes.  Pertinent labs & imaging results that were available during my care of the patient were reviewed by me and considered in my medical decision making (see chart for  details).  LACERATION REPAIR Performed by: Geoffery Lyons Authorized by: Geoffery Lyons Consent: Verbal consent obtained. Risks and benefits: risks, benefits and alternatives were discussed Consent given by: patient Patient identity confirmed: provided demographic data Prepped and Draped in normal sterile fashion Wound explored  Laceration Location: right scalp  Laceration Length: 2.5 cm  No Foreign Bodies seen or palpated  Anesthesia: local infiltration  Local anesthetic: lidocaine 1 % without epinephrine  Anesthetic total: 4 ml  Irrigation method: syringe Amount of cleaning: standard  Skin closure: Staples  Number of sutures: 4  Technique: staples  Patient tolerance: Patient tolerated the procedure well with no immediate complications.  Patient is a 63 year old male with history of Parkinson's brought for evaluation of fall.  He fell this morning while attempting to ambulate and struck his head on a brick wall.  He has a laceration to the right parietal region.  He is not taking any blood thinners and head CT is negative.  The laceration was repaired as above.  Patient will be discharged with staple removal in 5 to 7 days and return as needed for any problems.  Final Clinical Impressions(s) / ED Diagnoses   Final diagnoses:  None    ED Discharge Orders    None       Geoffery Lyons, MD 10/04/18 1132

## 2018-10-04 NOTE — ED Notes (Signed)
Pt returns from ct scan. 

## 2018-10-04 NOTE — ED Notes (Signed)
Family at bedside. 

## 2018-10-04 NOTE — Discharge Instructions (Addendum)
Staples are to be removed in 5 to 7 days.  Please follow-up with your primary doctor for this.  Return to the emergency department if you develop worsening headache, confusion, nausea/vomiting or other new and concerning symptoms.

## 2018-10-04 NOTE — ED Notes (Signed)
Patient transported to CT 

## 2018-10-04 NOTE — ED Notes (Signed)
Patient verbalizes understanding of discharge instructions. Opportunity for questioning and answers were provided. Armband removed by staff, pt discharged from ED.  

## 2018-10-04 NOTE — ED Triage Notes (Signed)
Pt presents for evaluation of mechanical fall today. Hx of parkinsons and frequent falls. Hit head on brick wall. Patient has laceration to R parietal head. Bleeding controlled, no blood thinners. No other injuries.

## 2018-10-05 ENCOUNTER — Emergency Department (HOSPITAL_COMMUNITY)
Admission: EM | Admit: 2018-10-05 | Discharge: 2018-10-05 | Disposition: A | Discharge status: Home / Self Care | Payer: BLUE CROSS/BLUE SHIELD | Attending: Emergency Medicine | Admitting: Emergency Medicine

## 2018-10-05 ENCOUNTER — Encounter (HOSPITAL_COMMUNITY): Payer: Self-pay | Admitting: Emergency Medicine

## 2018-10-05 DIAGNOSIS — I10 Essential (primary) hypertension: Secondary | ICD-10-CM | POA: Diagnosis not present

## 2018-10-05 DIAGNOSIS — R296 Repeated falls: Secondary | ICD-10-CM | POA: Insufficient documentation

## 2018-10-05 DIAGNOSIS — G20 Parkinson's disease: Secondary | ICD-10-CM | POA: Diagnosis not present

## 2018-10-05 DIAGNOSIS — I2510 Atherosclerotic heart disease of native coronary artery without angina pectoris: Secondary | ICD-10-CM | POA: Diagnosis not present

## 2018-10-05 DIAGNOSIS — W1830XA Fall on same level, unspecified, initial encounter: Secondary | ICD-10-CM | POA: Insufficient documentation

## 2018-10-05 DIAGNOSIS — E119 Type 2 diabetes mellitus without complications: Secondary | ICD-10-CM | POA: Insufficient documentation

## 2018-10-05 DIAGNOSIS — R42 Dizziness and giddiness: Secondary | ICD-10-CM | POA: Diagnosis present

## 2018-10-05 LAB — CBC WITH DIFFERENTIAL/PLATELET
Abs Immature Granulocytes: 0.01 10*3/uL (ref 0.00–0.07)
Basophils Absolute: 0 10*3/uL (ref 0.0–0.1)
Basophils Relative: 1 %
Eosinophils Absolute: 0.1 10*3/uL (ref 0.0–0.5)
Eosinophils Relative: 1 %
HCT: 43.5 % (ref 39.0–52.0)
Hemoglobin: 13.3 g/dL (ref 13.0–17.0)
Immature Granulocytes: 0 %
Lymphocytes Relative: 26 %
Lymphs Abs: 1.7 10*3/uL (ref 0.7–4.0)
MCH: 28.5 pg (ref 26.0–34.0)
MCHC: 30.6 g/dL (ref 30.0–36.0)
MCV: 93.1 fL (ref 80.0–100.0)
MONO ABS: 0.6 10*3/uL (ref 0.1–1.0)
Monocytes Relative: 9 %
Neutro Abs: 4.1 10*3/uL (ref 1.7–7.7)
Neutrophils Relative %: 63 %
Platelets: 217 10*3/uL (ref 150–400)
RBC: 4.67 MIL/uL (ref 4.22–5.81)
RDW: 13.3 % (ref 11.5–15.5)
WBC: 6.5 10*3/uL (ref 4.0–10.5)
nRBC: 0 % (ref 0.0–0.2)

## 2018-10-05 LAB — BASIC METABOLIC PANEL
Anion gap: 11 (ref 5–15)
BUN: 12 mg/dL (ref 8–23)
CO2: 24 mmol/L (ref 22–32)
Calcium: 8.9 mg/dL (ref 8.9–10.3)
Chloride: 103 mmol/L (ref 98–111)
Creatinine, Ser: 0.71 mg/dL (ref 0.61–1.24)
GFR calc Af Amer: >60 mL/min (ref >60)
GFR calc non Af Amer: >60 mL/min (ref >60)
Glucose, Bld: 122 mg/dL — ABNORMAL HIGH (ref 70–99)
Potassium: 3.8 mmol/L (ref 3.5–5.1)
Sodium: 138 mmol/L (ref 135–145)

## 2018-10-05 LAB — I-STAT TROPONIN, ED: Troponin i, poc: 0 ng/mL (ref 0.00–0.08)

## 2018-10-05 NOTE — ED Notes (Signed)
D/c reviewed with patient and brother

## 2018-10-05 NOTE — ED Notes (Signed)
Patient ambulated to the bathroom with walker-no incident.

## 2018-10-05 NOTE — ED Provider Notes (Signed)
[facility] Provider Note   CSN: [id number] Arrival date & time: 10/05/18  0840     History   Chief Complaint No chief complaint on file.   HPI Alan BailiffHarry Wells is a 63 y.o. male.  63 year old male with history of Parkinson's presents after becoming dizzy when standing today.  Patient fell to the ground but did not strike his head.  Denies any neck discomfort.  Denies any recent fever, vomiting, diarrhea.  No abdominal or chest discomfort.  He here yesterday after a fall and had a laceration repair.  States he did have his morning medications.  EMS was called and there was some question of the nursing home staff saw the patient with an altered mental status.  There is no reported seizure activity.  This event was not witnessed initially.  By time EMS arrived there, his blood sugar was 155.  According to staff at the nursing home he was at his baseline.  Was transported here for further evaluation     Past Medical History:  Diagnosis Date  . Adult onset fluency disorder   . Coronary artery disease    a. s/p stent left anterior descending artery remotely (per 2017 note, "about 4 years" prior). b. Normal nuc 05/2015.  . Diabetes mellitus type 2, controlled (HCC) 01/18/2016  . Dysphagia 2018  . Gait instability 2018  . History of falling   . Hypercholesterolemia   . Hypertension   . Memory change 2018  . Parkinson's disease (HCC) 2018  . Weight loss 2017   intentional    Patient Active Problem List   Diagnosis Date Noted  . Elevated troponin 08/03/2018  . Leukocytosis 08/03/2018  . Parkinson's disease (HCC) 11/29/2017  . Constipation 06/03/2017  . Fall 06/03/2017  . Obesity (BMI 30-39.9) 05/10/2017  . Insomnia 03/19/2017  . NSTEMI (non-ST elevated myocardial infarction) (HCC)   . Hypertension   . Hypercholesterolemia   . 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 Cognitive impairment 09/17/2016  . Unsteady gait 09/17/2016  .  Dysphagia 09/17/2016  . Diabetes mellitus type 2, controlled (HCC) 01/18/2016  . Coronary artery disease 09/17/2014    Past Surgical History:  Procedure Laterality Date  . CAROTID STENT  2012   in left anterior descending artery past EF 55%  . nuclear stress test  06/17/2015   Gated SPECT images reveal normal LV systolic function. EF 55%. Comparing the rest & stress SPECT images, no ischemia. Gated images are normal. Dr. Vernell BarrierWaheed Akhtar, [place]         Home Medications    Prior to Admission medications   Medication Sig Start Date End Date Taking? Authorizing Provider  aspirin 81 MG chewable tablet Chew 81 mg by mouth daily.     [provider]  atorvastatin (LIPITOR) 40 MG tablet Take 40 mg by mouth at bedtime. \    [provider]  carbidopa-levodopa (SINEMET IR) 25-100 MG tablet Take 1.5 tablets by mouth 4 (four) times daily. Take 1 1/2 tablet    [provider]  chlorhexidine (PERIDEX) 0.12 % solution Use as directed 5 mLs in the mouth or throat at bedtime.    [provider]  donepezil (ARICEPT) 10 MG tablet Take 10 mg by mouth at bedtime.    [provider]  lisinopril (PRINIVIL,ZESTRIL) 10 MG tablet Take 10 mg by mouth daily.     [provider]  metoprolol succinate (TOPROL-XL) 100 MG 24 hr tablet Take 100 mg by  mouth daily.     [provider]  NON FORMULARY Hemp oil -4 sprays sublingual once a day    [provider]  rOPINIRole (REQUIP) 0.25 MG tablet Take 0.25 mg by mouth daily.     [provider]  sennosides-docusate sodium (SENOKOT-S) 8.6-50 MG tablet Take 1 tablet by mouth every other day.     [provider]    Family History Family History  Problem Relation Age of Onset  . Cancer Mother   . Heart disease Mother     Social History Social History   Tobacco Use  . Smoking status: Never Smoker  . Smokeless tobacco: Never Used  Substance Use Topics  . Alcohol use:  No  . Drug use: No     Allergies   Codeine   Review of Systems Review of Systems  All other systems reviewed and are negative.    Physical Exam Updated Vital Signs Ht 1.803 m (5\' 11" )   Wt 110.7 kg   BMI 34.03 kg/m   Physical Exam Vitals signs and nursing note reviewed.  Constitutional:      General: He is not in acute distress.    Appearance: Normal appearance. He is well-developed. He is not toxic-appearing.  HENT:     Head:     Comments: Occipital bandage intact without new bleeding. Eyes:     General: Lids are normal.     Conjunctiva/sclera: Conjunctivae normal.     Pupils: Pupils are equal, round, and reactive to light.  Neck:     Musculoskeletal: Normal range of motion and neck supple. No spinous process tenderness or muscular tenderness.     Thyroid: No thyroid mass.     Trachea: No tracheal deviation.  Cardiovascular:     Rate and Rhythm: Normal rate and regular rhythm.     Heart sounds: Normal heart sounds. No murmur. No gallop.   Pulmonary:     Effort: Pulmonary effort is normal. No respiratory distress.     Breath sounds: Normal breath sounds. No stridor. No decreased breath sounds, wheezing, rhonchi or rales.  Abdominal:     General: Bowel sounds are normal. There is no distension.     Palpations: Abdomen is soft.     Tenderness: There is no abdominal tenderness. There is no rebound.  Musculoskeletal: Normal range of motion.        General: No tenderness.  Skin:    General: Skin is warm and dry.     Findings: No abrasion or rash.  Neurological:     Mental Status: He is alert and oriented to person, place, and time. Mental status is at baseline.     GCS: GCS eye subscore is 4. GCS verbal subscore is 5. GCS motor subscore is 6.     Cranial Nerves: No cranial nerve deficit.     Sensory: No sensory deficit.     Motor: Tremor present.     Coordination: Finger-Nose-Finger Test normal.     Comments: Strength is 5 of 5 in all 4 extremities.  Speech is  somewhat slow but appears to be his baseline.  Psychiatric:        Attention and Perception: Attention normal.        Speech: Speech is delayed.      ED Treatments / Results  Labs (all labs ordered are listed, but only abnormal results are displayed) Labs Reviewed  CBC WITH DIFFERENTIAL/PLATELET  BASIC METABOLIC PANEL  I-STAT TROPONIN, ED    EKG EKG Interpretation  Date/Time:  Sunday October 05 2018 08:57:08 EST Ventricular Rate:  50 PR Interval:    QRS Duration: 86 QT Interval:  467 QTC Calculation: 426 R Axis:   34 Text Interpretation:  Sinus rhythm Abnormal R-wave progression, early transition No significant change since last tracing Confirmed by Lorre Nick ([id number]) on 10/05/2018 9:42:41 AM   Radiology Ct Head Wo Contrast  Result Date: 10/04/2018 CLINICAL DATA:  Pain after hitting head against wall EXAM: CT HEAD WITHOUT CONTRAST TECHNIQUE: Contiguous axial images were obtained from the base of the skull through the vertex without intravenous contrast. COMPARISON:  August 03, 2018 FINDINGS: Brain: There is mild diffuse atrophy. There is no intracranial mass, hemorrhage, extra-axial fluid collection, or midline shift. Brain parenchyma appears unremarkable. No acute infarct evident. Vascular: No hyperdense vessel. There is calcification in each carotid siphon region as well as in the distal left vertebral artery. Skull: There are skin staples overlying the posterosuperior right frontal bone. Bony calvarium appears intact. Sinuses/Orbits: There is mucosal thickening in the maxillary antra as well as in several ethmoid air cells. Other visualized paranasal sinuses are clear. Orbits appear symmetric bilaterally. Other: Visualized mastoid air cells are clear. There is debris in the right external auditory canal. IMPRESSION: Brain parenchyma appears unremarkable.  No mass or hemorrhage. Foci of arterial vascular calcification noted. Areas of paranasal sinus disease noted. Probable  cerumen in the right external auditory canal. Electronically Signed   By: Bretta Bang III M.D.   On: 10/04/2018 11:04    Procedures Procedures (including critical care time)  Medications Ordered in ED Medications - No data to display   Initial Impression / Assessment and Plan / ED Course  I have reviewed the triage vital signs and the nursing notes.  Pertinent labs & imaging results that were available during my care of the patient were reviewed by me and considered in my medical decision making (see chart for details).     Labs reviewed and are reassuring.  Patient is able to ambulate at his baseline.  Was able to have a meal and feels able to be discharged to home at this time.  Return precautions given  Final Clinical Impressions(s) / ED Diagnoses   Final diagnoses:  None    ED Discharge Orders    None       Lorre Nick, MD 10/05/18 1120

## 2018-10-05 NOTE — ED Notes (Signed)
Breakfast tray ordered to meet patient's preference

## 2018-10-05 NOTE — ED Triage Notes (Signed)
[facility] due [facility]. Patient reports that he got up to get dressed, felt dizzy, and fell on his "bottom." Patient was here yesterday for fall. Hx: Parkinson's, DM, HTN, CAD. Speech is at baseline GCS 15, A&O X 4 Denies pain at this time

## 2018-10-06 ENCOUNTER — Encounter: Payer: Self-pay | Admitting: Nurse Practitioner

## 2018-10-06 ENCOUNTER — Non-Acute Institutional Stay: Payer: BLUE CROSS/BLUE SHIELD | Admitting: Nurse Practitioner

## 2018-10-06 DIAGNOSIS — W19XXXA Unspecified fall, initial encounter: Secondary | ICD-10-CM

## 2018-10-06 DIAGNOSIS — R4189 Other symptoms and signs involving cognitive functions and awareness: Secondary | ICD-10-CM

## 2018-10-06 DIAGNOSIS — S0101XS Laceration without foreign body of scalp, sequela: Secondary | ICD-10-CM | POA: Diagnosis not present

## 2018-10-06 DIAGNOSIS — R42 Dizziness and giddiness: Secondary | ICD-10-CM | POA: Diagnosis not present

## 2018-10-06 DIAGNOSIS — R2681 Unsteadiness on feet: Secondary | ICD-10-CM

## 2018-10-06 DIAGNOSIS — S0101XD Laceration without foreign body of scalp, subsequent encounter: Secondary | ICD-10-CM | POA: Insufficient documentation

## 2018-10-06 DIAGNOSIS — G20 Parkinson's disease: Secondary | ICD-10-CM

## 2018-10-06 NOTE — Assessment & Plan Note (Signed)
The patient needs hight level of care, SNF when bed is available, continue Donepezil 10mg  qd.

## 2018-10-06 NOTE — Assessment & Plan Note (Signed)
With standing up, it was noted orthostatic blood pressure change, the patient was instructed to change position slowly, call for assistance for transferring.

## 2018-10-06 NOTE — Progress Notes (Signed)
Location:  [facility] Room Number: 915 Place of Service:  ALF [phone] Provider:  Chipper OmanMast, ManXie  NP  [name], [name], NP  Patient Care Team: [name], [name], NP as PCP - General (Internal Medicine)  Extended Emergency Contact Information Primary Emergency Contact: Vandenbos,Frank Address: [address]          [place], [place] [address] [place] Home Phone: [phone] Mobile Phone: [phone] Relation: Brother  Code Status:  DNR Goals of care: Advanced Directive information Advanced Directives 10/04/2018  Does Patient Have a Medical Advance Directive? Yes  Type of Estate agentAdvance Directive Healthcare Power of [place];Living will  Does patient want to make changes to medical advance directive? -  Copy of Healthcare Power of Attorney in Chart? -  Would patient like information on creating a medical advance directive? -  Pre-existing out of facility DNR order (yellow form or pink MOST form) -     Chief Complaint  Patient presents with  . Acute Visit    Larey SeatFell 3 times, one with injury (laceration to forehead)    HPI:  Pt is a 63 y.o. male seen today for an acute visit for s/p falls,  scalp laceration-s/p staples x4, ED evaluation 10/04/18, 10/05/18.   Fell: 10/04/18, fell backwards, stoke head against edge of brick wall when stood up after breakfast, the patient stated he felt dizziness associated with standing up, sustained right parietal laceration, staple closure x4, ED eval, CT head wo CM showed unremarkable.   Larey SeatFell 10/05/18 the patient was found standing at closet door with walker to the right side of him after a loud noise. He c/o dizziness associated with falling, but is unable to tell hos he fell or got up. EKG BMP, CBC/diff Troponin I are unremarkable. F/u [facility] 12/07/17  Larey SeatFell 10/06/18 in his room, stated he was getting g up off the bed to get in chair and tripped over his own feet   10/06/18 Bp Standing 132/70, laying 134/70  10/05/18 Bp standing  156/84, siting 148/70   Hx of parkinson's disease, on Requip 0.25mg  qd, Sinemet 25/100 1.5 tab qid. HTN, blood pressure is controlled on Metoprolol 100mg  qd, Lisinopril 10mg  qd. Dementia, on Onepezil 10mg  qd.   Past Medical History:  Diagnosis Date  . Adult onset fluency disorder   . Coronary artery disease    a. s/p stent left anterior descending artery remotely (per 2017 note, "about 4 years" prior). b. Normal nuc 05/2015.  . Diabetes mellitus type 2, controlled (HCC) 01/18/2016  . Dysphagia 2018  . Gait instability 2018  . History of falling   . Hypercholesterolemia   . Hypertension   . Memory change 2018  . Parkinson's disease (HCC) 2018  . Weight loss 2017   intentional   Past Surgical History:  Procedure Laterality Date  . CAROTID STENT  2012   in left anterior descending artery past EF 55%  . nuclear stress test  06/17/2015   Gated SPECT images reveal normal LV systolic function. EF 55%. Comparing the rest & stress SPECT images, no ischemia. Gated images are normal. Dr. Vernell BarrierWaheed Akhtar, [place]     Allergies  Allergen Reactions  . Codeine     Unknown     Outpatient Encounter Medications as of 10/06/2018  Medication Sig  . aspirin 81 MG chewable tablet Chew 81 mg by mouth daily.   [name]. atorvastatin (LIPITOR) 40 MG tablet Take 40 mg by mouth at bedtime. \  . carbidopa-levodopa (  SINEMET IR) 25-100 MG tablet Take 1.5 tablets by mouth 4 (four) times daily. Take 1 1/2 tablet  . chlorhexidine (PERIDEX) 0.12 % solution Use as directed 5 mLs in the mouth or throat at bedtime.  . donepezil (ARICEPT) 10 MG tablet Take 10 mg by mouth at bedtime.  [name]. lisinopril (PRINIVIL,ZESTRIL) 10 MG tablet Take 10 mg by mouth daily.   . metoprolol succinate (TOPROL-XL) 100 MG 24 hr tablet Take 100 mg by mouth daily.   [name]. rOPINIRole (REQUIP) 0.25 MG tablet Take 0.25 mg by mouth daily.   . sennosides-docusate sodium (SENOKOT-S) 8.6-50 MG tablet Take 1 tablet by mouth every other day.   .  [DISCONTINUED] NON FORMULARY Hemp oil -4 sprays sublingual once a day   No facility-administered encounter medications on file as of 10/06/2018.    ROS was provided with assistance of staff Review of Systems  Constitutional: Negative for activity change, appetite change, chills, diaphoresis, fatigue and fever.  HENT: Positive for trouble swallowing. Negative for congestion, hearing loss and voice change.   Eyes: Negative for visual disturbance.  Respiratory: Negative for cough, shortness of breath and wheezing.   Gastrointestinal: Negative for abdominal distention, abdominal pain, constipation, diarrhea, nausea and vomiting.  Genitourinary: Negative for difficulty urinating, dysuria and urgency.  Musculoskeletal: Positive for arthralgias and gait problem.  Skin: Positive for wound. Negative for color change.       Right parietal traumatic scalp laceration, staple closures x4  Neurological: Positive for dizziness. Negative for facial asymmetry, speech difficulty, weakness, numbness and headaches.       Memory lapses.   Psychiatric/Behavioral: Negative for agitation, behavioral problems, hallucinations and sleep disturbance. The patient is not nervous/anxious.     Immunization History  Administered Date(s) Administered  . Influenza Whole 06/20/2018  . Influenza-Unspecified 07/08/2017  . PPD Test 01/15/2017  . Pneumococcal Polysaccharide-23 08/05/2018  . Td 09/17/2016   Pertinent  Health Maintenance Due  Topic Date Due  . COLONOSCOPY  06/03/2004  . OPHTHALMOLOGY EXAM  10/08/2017  . FOOT EXAM  01/21/2018  . HEMOGLOBIN A1C  02/02/2019  . INFLUENZA VACCINE  Completed   Fall Risk  01/21/2017  Falls in the past year? Yes  Number falls in past yr: 2 or more  Injury with Fall? Yes  Risk Factor Category  High Fall Risk  Risk for fall due to : History of fall(s);Impaired balance/gait;Impaired mobility  Follow up Falls evaluation completed   Functional Status Survey:    Vitals:    10/06/18 1046  BP: 140/88  Pulse: 60  Resp: 18  Temp: 97.8 F (36.6 C)  SpO2: 97%  Weight: 249 lb 9.6 oz (113.2 kg)  Height: 5\' 11"  (1.803 m)   Body mass index is 34.81 kg/m. Physical Exam Constitutional:      General: He is not in acute distress.    Appearance: Normal appearance. He is not ill-appearing, toxic-appearing or diaphoretic.  HENT:     Head: Normocephalic.     Comments: Traumatic right parietal scalp laceration, staple closure x4    Nose: Nose normal. No congestion or rhinorrhea.     Mouth/Throat:     Mouth: Mucous membranes are moist.  Eyes:     Extraocular Movements: Extraocular movements intact.     Conjunctiva/sclera: Conjunctivae normal.     Pupils: Pupils are equal, round, and reactive to light.  Neck:     Musculoskeletal: Normal range of motion and neck supple.  Cardiovascular:     Rate and Rhythm: Normal rate and regular  rhythm.     Heart sounds: No murmur.  Pulmonary:     Effort: Pulmonary effort is normal.     Breath sounds: Normal breath sounds. No wheezing or rhonchi.  Abdominal:     General: There is no distension.     Palpations: Abdomen is soft.     Tenderness: There is no abdominal tenderness. There is no guarding or rebound.  Musculoskeletal: Normal range of motion.     Right lower leg: No edema.     Left lower leg: No edema.     Comments: Needs assistance with transfer, w/c for mobility.   Skin:    General: Skin is warm and dry.     Coloration: Skin is not pale.     Findings: No erythema or rash.     Comments: Right parietal scalp laceration with staple closures x4, no s/s of infection.   Neurological:     General: No focal deficit present.     Mental Status: He is alert. Mental status is at baseline.     Cranial Nerves: No cranial nerve deficit.     Motor: No weakness.     Coordination: Coordination normal.     Gait: Gait abnormal.     Comments: Oriented to person and place. Masking facial looks.   Psychiatric:        Mood and  Affect: Mood normal.        Behavior: Behavior normal.     Labs reviewed: Recent Labs    08/03/18 2036 09/15/18 10/05/18 0922  NA 134* 140 138  K 3.6 4.2 3.8  CL 106 104 103  CO2 20* 25 24  GLUCOSE 159*  --  122*  BUN 18 15 12   CREATININE 1.07 0.7 0.71  CALCIUM 9.0 8.8 8.9   Recent Labs    12/03/17 06/10/18  AST 13* 12*  ALT 17 18  ALKPHOS 77 89  PROT  --  6.3  ALBUMIN  --  4.1   Recent Labs    08/03/18 2036 08/05/18 0427 09/15/18 10/05/18 0922  WBC 18.0* 10.2 6.6 6.5  NEUTROABS  --   --   --  4.1  HGB 14.1 12.8* 12.8* 13.3  HCT 46.6 40.7 39* 43.5  MCV 92.6 91.9  --  93.1  PLT 275 215 196 217   Lab Results  Component Value Date   TSH 1.70 12/03/2017   Lab Results  Component Value Date   HGBA1C 6.3 (H) 08/04/2018   Lab Results  Component Value Date   CHOL 95 08/04/2018   HDL 31 (L) 08/04/2018   LDLCALC 47 08/04/2018   TRIG 84 08/04/2018   CHOLHDL 3.1 08/04/2018    Significant Diagnostic Results in last 30 days:  Ct Head Wo Contrast  Result Date: 10/04/2018 CLINICAL DATA:  Pain after hitting head against wall EXAM: CT HEAD WITHOUT CONTRAST TECHNIQUE: Contiguous axial images were obtained from the base of the skull through the vertex without intravenous contrast. COMPARISON:  August 03, 2018 FINDINGS: Brain: There is mild diffuse atrophy. There is no intracranial mass, hemorrhage, extra-axial fluid collection, or midline shift. Brain parenchyma appears unremarkable. No acute infarct evident. Vascular: No hyperdense vessel. There is calcification in each carotid siphon region as well as in the distal left vertebral artery. Skull: There are skin staples overlying the posterosuperior right frontal bone. Bony calvarium appears intact. Sinuses/Orbits: There is mucosal thickening in the maxillary antra as well as in several ethmoid air cells. Other visualized paranasal sinuses are clear. Orbits  appear symmetric bilaterally. Other: Visualized mastoid air cells are  clear. There is debris in the right external auditory canal. IMPRESSION: Brain parenchyma appears unremarkable.  No mass or hemorrhage. Foci of arterial vascular calcification noted. Areas of paranasal sinus disease noted. Probable cerumen in the right external auditory canal. Electronically Signed   By: Bretta Bang III M.D.   On: 10/04/2018 11:04    Assessment/Plan Scalp laceration, sequela Right parietal, staple closure x4, no s/s of infection, removal 7 days. Observe.   Fall Lack of safety awareness, unsteady gait, change of orthostatic BP are contributory. The patient needs hight level of care for safety. Close supervision needed.   Dizziness With standing up, it was noted orthostatic blood pressure change, the patient was instructed to change position slowly, call for assistance for transferring.   Cognitive impairment The patient needs hight level of care, SNF when bed is available, continue Donepezil 10mg  qd.   Parkinson's disease (HCC) Continue Sinemet 25/100mg  1.5 tab qid, Requip 0.25mg  qd, assistance the patient with transfer, change position gradually. W/c for mobility.   Unsteady gait Parkinson's disease is etiology, he needs assistance for transfer, w/c for mobility, higher level of care for safety.      Family/ staff Communication: plan of care reviewed with the patient and charge nurse.   Labs/tests ordered:  none  Time spend 25 minutes.

## 2018-10-06 NOTE — Assessment & Plan Note (Signed)
Right parietal, staple closure x4, no s/s of infection, removal 7 days. Observe.

## 2018-10-06 NOTE — Assessment & Plan Note (Signed)
Lack of safety awareness, unsteady gait, change of orthostatic BP are contributory. The patient needs hight level of care for safety. Close supervision needed.

## 2018-10-06 NOTE — Assessment & Plan Note (Signed)
Continue Sinemet 25/100mg  1.5 tab qid, Requip 0.25mg  qd, assistance the patient with transfer, change position gradually. W/c for mobility.

## 2018-10-06 NOTE — Assessment & Plan Note (Signed)
Parkinson's disease is etiology, he needs assistance for transfer, w/c for mobility, higher level of care for safety.

## 2018-10-17 ENCOUNTER — Encounter: Payer: Self-pay | Admitting: Internal Medicine

## 2018-10-17 NOTE — Progress Notes (Signed)
Opened in Error.

## 2018-10-28 ENCOUNTER — Non-Acute Institutional Stay: Payer: BLUE CROSS/BLUE SHIELD | Admitting: Internal Medicine

## 2018-10-28 ENCOUNTER — Encounter: Payer: Self-pay | Admitting: Internal Medicine

## 2018-10-28 DIAGNOSIS — I10 Essential (primary) hypertension: Secondary | ICD-10-CM | POA: Diagnosis not present

## 2018-10-28 DIAGNOSIS — I2510 Atherosclerotic heart disease of native coronary artery without angina pectoris: Secondary | ICD-10-CM | POA: Diagnosis not present

## 2018-10-28 DIAGNOSIS — E119 Type 2 diabetes mellitus without complications: Secondary | ICD-10-CM | POA: Diagnosis not present

## 2018-10-28 DIAGNOSIS — R4189 Other symptoms and signs involving cognitive functions and awareness: Secondary | ICD-10-CM

## 2018-10-28 DIAGNOSIS — G20 Parkinson's disease: Secondary | ICD-10-CM | POA: Diagnosis not present

## 2018-10-28 DIAGNOSIS — E7800 Pure hypercholesterolemia, unspecified: Secondary | ICD-10-CM

## 2018-10-28 DIAGNOSIS — R2681 Unsteadiness on feet: Secondary | ICD-10-CM

## 2018-10-28 NOTE — Progress Notes (Signed)
Provider:   Location:  [facility] Room Number: 4 Place of Service:  SNF (31)  PCP: [name], [name], NP Patient Care Team: [name], [name], NP as PCP - General (Internal Medicine)  Extended Emergency Contact Information Primary Emergency Contact: Navarra,Frank Address: [address]          [place], [place] [address] Darden [place] of [place] Home Phone: [phone] Mobile Phone: [phone] Relation: Brother  Code Status: DNR Goals of Care: Advanced Directive information Advanced Directives 10/28/2018  Does Patient Have a Medical Advance Directive? Yes  Type of Estate agent of [place];Out of facility DNR (pink MOST or yellow form)  Does patient want to make changes to medical advance directive? No - Patient declined  Copy of Healthcare Power of Attorney in Chart? Yes - validated most recent copy scanned in chart (See row information)  Would patient like information on creating a medical advance directive? -  Pre-existing out of facility DNR order (yellow form or pink MOST form) Yellow form placed in chart (order not valid for inpatient use)      Chief Complaint  Patient presents with  . New Admit To SNF    to admit to facility     HPI: Patient is a 63 y.o. male seen today for admission to SNF. [place] Patient has a history of recurrent falls , Parkinson disease diagnosed in 2018, diabetes mellitus, coronary artery disease, hypertension, hyperlipidemia, cognitive impairment  Patient has had number of falls recently in past few months leading to some injuries including lacerations.  Since he was requiring more care he was transferred from [place] to SNF Patient did not have any acute complaints today.  Discussed with the nurses did not have any acute issues.  Patient states that he walks with a walker and most of the fall he just loses his balance and falls on his back.  He denies any dizziness or syncope Past Medical  History:  Diagnosis Date  . Adult onset fluency disorder   . Coronary artery disease    a. s/p stent left anterior descending artery remotely (per 2017 note, "about 4 years" prior). b. Normal nuc 05/2015.  . Diabetes mellitus type 2, controlled (HCC) 01/18/2016  . Dysphagia 2018  . Gait instability 2018  . History of falling   . Hypercholesterolemia   . Hypertension   . Memory change 2018  . Parkinson's disease (HCC) 2018  . Weight loss 2017   intentional   Past Surgical History:  Procedure Laterality Date  . CAROTID STENT  2012   in left anterior descending artery past EF 55%  . nuclear stress test  06/17/2015   Gated SPECT images reveal normal LV systolic function. EF 55%. Comparing the rest & stress SPECT images, no ischemia. Gated images are normal. Dr. Vernell Barrier, MD [place], [place]     reports that he has never smoked. He has never used smokeless tobacco. He reports that he does not drink alcohol or use drugs. Social History   Socioeconomic History  . Marital status: Single    Spouse name: Not on file  . Number of children: Not on file  . Years of education: Not on file  . Highest education level: Not on file  Occupational History  . Occupation:  Engineer, maintenance  Social Needs  . Financial resource strain: Not on file  . Food insecurity:    Worry: Not on file    Inability: Not  on file  . Transportation needs:    Medical: Not on file    Non-medical: Not on file  Tobacco Use  . Smoking status: Never Smoker  . Smokeless tobacco: Never Used  Substance and Sexual Activity  . Alcohol use: No  . Drug use: No  . Sexual activity: Never  Lifestyle  . Physical activity:    Days per week: Not on file    Minutes per session: Not on file  . Stress: Not on file  Relationships  . Social connections:    Talks on phone: Not on file    Gets together: Not on file    Attends religious service: Not on file    Active member of club or organization: Not on file    Attends  meetings of clubs or organizations: Not on file    Relationship status: Not on file  . Intimate partner violence:    Fear of current or ex partner: Not on file    Emotionally abused: Not on file    Physically abused: Not on file    Forced sexual activity: Not on file  Other Topics Concern  . Not on file  Social History Narrative   [facility] [place]   Single   Never smoked   Alcohol none    [employer]   Walks with walker    Functional Status Survey:    Family History  Problem Relation Age of Onset  . Cancer Mother   . Heart disease Mother     Health Maintenance  Topic Date Due  . Hepatitis C Screening  [birth year]  . COLONOSCOPY  06/03/2004  . OPHTHALMOLOGY EXAM  10/08/2017  . FOOT EXAM  01/21/2018  . HEMOGLOBIN A1C  02/02/2019  . TETANUS/TDAP  09/17/2026  . INFLUENZA VACCINE  Completed  . HIV Screening  Completed    Allergies  Allergen Reactions  . Codeine     Unknown     Outpatient Encounter Medications as of 10/28/2018  Medication Sig  . aspirin 81 MG chewable tablet Chew 81 mg by mouth daily.   [name]. atorvastatin (LIPITOR) 40 MG tablet Take 40 mg by mouth at bedtime. \  . carbidopa-levodopa (SINEMET IR) 25-100 MG tablet Take 1.5 tablets by mouth 4 (four) times daily. Take 1 1/2 tablet  . chlorhexidine (PERIDEX) 0.12 % solution Use as directed 5 mLs in the mouth or throat at bedtime.  . donepezil (ARICEPT) 10 MG tablet Take 10 mg by mouth at bedtime.  [name]. lisinopril (PRINIVIL,ZESTRIL) 10 MG tablet Take 10 mg by mouth daily.   . metoprolol succinate (TOPROL-XL) 100 MG 24 hr tablet Take 100 mg by mouth daily.   [name]. rOPINIRole (REQUIP) 0.25 MG tablet Take 0.25 mg by mouth daily.   . sennosides-docusate sodium (SENOKOT-S) 8.6-50 MG tablet Take 1 tablet by mouth every other day.    No facility-administered encounter medications on file as of 10/28/2018.     Review of Systems Review of Systems  Constitutional: Negative for activity change,  appetite change, chills, diaphoresis, fatigue and fever.  HENT: Negative for mouth sores, postnasal drip, rhinorrhea, sinus pain and sore throat.   Respiratory: Negative for apnea, cough, chest tightness, shortness of breath and wheezing.   Cardiovascular: Negative for chest pain, palpitations and leg swelling.  Gastrointestinal: Negative for abdominal distention, abdominal pain, constipation, diarrhea, nausea and vomiting.  Genitourinary: Negative for dysuria and frequency.  Musculoskeletal: Negative for arthralgias, joint swelling and myalgias.  Skin: Negative for rash.  Neurological: Negative for dizziness, syncope, weakness, light-headedness and numbness.  Psychiatric/Behavioral: Negative for behavioral problems, confusion and sleep disturbance.    Vitals:   10/28/18 0841  BP: 128/72  Pulse: (!) 54  Resp: 18  Temp: (!) 97.5 F (36.4 C)  SpO2: 96%  Weight: 245 lb 9.6 oz (111.4 kg)  Height: 5\' 11"  (1.803 m)   Body mass index is 34.25 kg/m. Physical Exam Constitutional:      Appearance: Normal appearance.  HENT:     Head: Normocephalic.     Nose: Nose normal.     Mouth/Throat:     Mouth: Mucous membranes are moist.     Pharynx: Oropharynx is clear.  Neck:     Musculoskeletal: Neck supple.  Cardiovascular:     Rate and Rhythm: Normal rate and regular rhythm.     Pulses: Normal pulses.     Heart sounds: Normal heart sounds.  Pulmonary:     Effort: Pulmonary effort is normal. No respiratory distress.     Breath sounds: Normal breath sounds.  Abdominal:     General: Abdomen is flat. Bowel sounds are normal. There is no distension.     Palpations: Abdomen is soft.     Tenderness: There is no abdominal tenderness.  Musculoskeletal:        General: No swelling.  Skin:    General: Skin is warm and dry.  Neurological:     General: No focal deficit present.     Mental Status: He is alert and oriented to person, place, and time.     Comments: Does have Flat Effect. No  Rigidity in Extremities.  Psychiatric:        Mood and Affect: Mood normal.        Thought Content: Thought content normal.        Judgment: Judgment normal.     Labs reviewed: Basic Metabolic Panel: Recent Labs    08/03/18 2036 09/15/18 10/05/18 0922  NA 134* 140 138  K 3.6 4.2 3.8  CL 106 104 103  CO2 20* 25 24  GLUCOSE 159*  --  122*  BUN 18 15 12   CREATININE 1.07 0.7 0.71  CALCIUM 9.0 8.8 8.9   Liver Function Tests: Recent Labs    12/03/17 06/10/18  AST 13* 12*  ALT 17 18  ALKPHOS 77 89  PROT  --  6.3  ALBUMIN  --  4.1   No results for input(s): LIPASE, AMYLASE in the last 8760 hours. No results for input(s): AMMONIA in the last 8760 hours. CBC: Recent Labs    08/03/18 2036 08/05/18 0427 09/15/18 10/05/18 0922  WBC 18.0* 10.2 6.6 6.5  NEUTROABS  --   --   --  4.1  HGB 14.1 12.8* 12.8* 13.3  HCT 46.6 40.7 39* 43.5  MCV 92.6 91.9  --  93.1  PLT 275 215 196 217   Cardiac Enzymes: Recent Labs    08/03/18 2345 08/04/18 0958  TROPONINI 0.26* 0.26*   BNP: Invalid input(s): POCBNP Lab Results  Component Value Date   HGBA1C 6.3 (H) 08/04/2018   Lab Results  Component Value Date   TSH 1.70 12/03/2017   No results found for: VITAMINB12 No results found for: FOLATE No results found for: IRON, TIBC, FERRITIN  Imaging and Procedures obtained prior to SNF admission: No results found.  Assessment/Plan Parkinson's disease with Unsteady Gait and Recurrent Falls. Patient on Requip and Sinemet It does not look like he has had Neurology Assessment Will get Neurology Consult  Diabetes Mellitus Not on Any Meds Last A1C was 6.3 in 11/19 Continue Accu check   CAD Was seen by Cardiologist in 11/19 when he was admitted with Syncope. 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

## 2018-11-26 ENCOUNTER — Non-Acute Institutional Stay: Payer: BLUE CROSS/BLUE SHIELD | Admitting: Nurse Practitioner

## 2018-11-26 ENCOUNTER — Encounter: Payer: Self-pay | Admitting: Nurse Practitioner

## 2018-11-26 DIAGNOSIS — I10 Essential (primary) hypertension: Secondary | ICD-10-CM

## 2018-11-26 DIAGNOSIS — G231 Progressive supranuclear ophthalmoplegia [Steele-Richardson-Olszewski]: Secondary | ICD-10-CM

## 2018-11-26 DIAGNOSIS — R2681 Unsteadiness on feet: Secondary | ICD-10-CM

## 2018-11-26 DIAGNOSIS — R4189 Other symptoms and signs involving cognitive functions and awareness: Secondary | ICD-10-CM | POA: Diagnosis not present

## 2018-11-26 NOTE — Assessment & Plan Note (Signed)
Continue w/c for mobility, contributory to falling.

## 2018-11-26 NOTE — Progress Notes (Signed)
[facility]:  [facility] Room Number: 4B Place of Service:  SNF (31) Provider: Elray Dains,ManXie  NP  Tomy Khim X, NP  Patient Care Team: Anjenette Gerbino X, NP as PCP - General (Internal Medicine)  Extended Emergency Contact Information Primary Emergency Contact: Winkles,Frank Address: [address]          [place], [place] [address] Darden [place] of [place] Home Phone: [phone] Mobile Phone: [phone] Relation: Brother  Code Status:  DNR Goals of care: Advanced Directive information Advanced Directives 11/26/2018  Does Patient Have a Medical Advance Directive? Yes  Type of Estate agent of [place];Out of facility DNR (pink MOST or yellow form)  Does patient want to make changes to medical advance directive? No - Patient declined  Copy of Healthcare Power of Attorney in Chart? Yes - validated most recent copy scanned in chart (See row information)  Would patient like information on creating a medical advance directive? -  Pre-existing out of facility DNR order (yellow form or pink MOST form) Yellow form placed in chart (order not valid for inpatient use)     Chief Complaint  Patient presents with  . Medical Management of Chronic Issues    HPI:  Pt is a 63 y.o. male seen today for medical management of chronic diseases.     The patient has history of progressive supranuclear pulase, w/c for mobility, frequent falling, on Requip 0.25mg  qd, Sinemet 25/100 1.5 tab qid. His memory is preserved on Donepezil  qd. HTN, blood pressure is controlled on Metoprolol  qd, Lisinopril  qd.    Past Medical History:  Diagnosis Date  . Adult onset fluency disorder   . Coronary artery disease    a. s/p stent left anterior descending artery remotely (per 2017 note, "about 4 years" prior). b. Normal nuc 05/2015.  . Diabetes mellitus type 2, controlled (HCC) 01/18/2016  . Dysphagia 2018  . Gait instability 2018  . History of falling   .  Hypercholesterolemia   . Hypertension   . Memory change 2018  . Parkinson's disease (HCC) 2018  . Weight loss 2017   intentional   Past Surgical History:  Procedure Laterality Date  . CAROTID STENT  2012   in left anterior descending artery past EF 55%  . nuclear stress test  06/17/2015   Gated SPECT images reveal normal LV systolic function. EF 55%. Comparing the rest & stress SPECT images, no ischemia. Gated images are normal. Dr. Vernell Barrier, MD [place], [place]     Allergies  Allergen Reactions  . Codeine     Unknown     Outpatient Encounter Medications as of 11/26/2018  Medication Sig  . aspirin 81 MG chewable tablet Chew 81 mg by mouth daily.   [name] atorvastatin (LIPITOR) 40 MG tablet Take 40 mg by mouth at bedtime. \  . carbidopa-levodopa (SINEMET IR) 25-100 MG tablet Take 1.5 tablets by mouth 4 (four) times daily.   . chlorhexidine (PERIDEX) 0.12 % solution Use as directed 5 mLs in the mouth or throat at bedtime.  . donepezil (ARICEPT) 10 MG tablet Take 10 mg by mouth at bedtime.  [name] lisinopril (PRINIVIL,ZESTRIL) 10 MG tablet Take 10 mg by mouth daily.   . metoprolol succinate (TOPROL-XL) 100 MG 24 hr tablet Take 100 mg by mouth daily.   [name] rOPINIRole (REQUIP) 0.25 MG tablet Take 0.25 mg by mouth at bedtime.   . sennosides-docusate sodium (SENOKOT-S) 8.6-50 MG tablet Take 1 tablet by mouth every other day.  No facility-administered encounter medications on file as of 11/26/2018.     Review of Systems  Constitutional: Negative for activity change, appetite change, chills, diaphoresis, fatigue, fever and unexpected weight change.  HENT: Positive for hearing loss and trouble swallowing. Negative for congestion and voice change.   Respiratory: Negative for cough, shortness of breath and wheezing.   Cardiovascular: Negative for chest pain, palpitations and leg swelling.  Gastrointestinal: Negative for abdominal pain, constipation, diarrhea, nausea and vomiting.  Genitourinary:  Negative for difficulty urinating, dysuria and urgency.  Musculoskeletal: Positive for gait problem.  Skin: Negative for color change and pallor.  Neurological: Negative for dizziness, tremors, speech difficulty, weakness and headaches.       Memory lapses.   Psychiatric/Behavioral: Negative for agitation, behavioral problems, hallucinations and sleep disturbance. The patient is not nervous/anxious.     Immunization History  Administered Date(s) Administered  . Influenza Whole 06/20/2018  . Influenza-Unspecified 07/08/2017  . PPD Test 01/15/2017  . Pneumococcal Polysaccharide-23 08/05/2018  . Td 09/17/2016   Pertinent  Health Maintenance Due  Topic Date Due  . COLONOSCOPY  06/03/2004  . OPHTHALMOLOGY EXAM  10/08/2017  . FOOT EXAM  01/21/2018  . HEMOGLOBIN A1C  02/02/2019  . INFLUENZA VACCINE  Completed   Fall Risk  01/21/2017  Falls in the past year? Yes  Number falls in past yr: 2 or more  Injury with Fall? Yes  Risk Factor Category  High Fall Risk  Risk for fall due to : History of fall(s);Impaired balance/gait;Impaired mobility  Follow up Falls evaluation completed   Functional Status Survey:    Vitals:   11/26/18 0810  BP: (!) 150/80  Pulse: 66  Resp: 20  Temp: 98.3 F (36.8 C)  SpO2: 99%  Weight: 244 lb 6.4 oz (110.9 kg)  Height: 5\' 11"  (1.803 m)   Body mass index is 34.09 kg/m. Physical Exam Constitutional:      General: He is not in acute distress.    Appearance: Normal appearance. He is obese. He is not ill-appearing, toxic-appearing or diaphoretic.  HENT:     Head: Normocephalic and atraumatic.     Nose: Nose normal.     Mouth/Throat:     Mouth: Mucous membranes are moist.  Eyes:     Extraocular Movements: Extraocular movements intact.     Pupils: Pupils are equal, round, and reactive to light.  Neck:     Musculoskeletal: Normal range of motion and neck supple.  Cardiovascular:     Rate and Rhythm: Normal rate and regular rhythm.     Heart  sounds: No murmur.  Pulmonary:     Effort: Pulmonary effort is normal.     Breath sounds: No wheezing, rhonchi or rales.  Abdominal:     General: Bowel sounds are normal.     Tenderness: There is no abdominal tenderness. There is no guarding or rebound.  Musculoskeletal:     Right lower leg: No edema.     Left lower leg: No edema.  Skin:    General: Skin is warm and dry.  Neurological:     General: No focal deficit present.     Mental Status: He is alert. Mental status is at baseline.     Motor: No weakness.     Coordination: Coordination normal.     Gait: Gait abnormal.     Comments: Oriented to person and place. W/c for mobility. Flat affect.   Psychiatric:        Mood and Affect: Mood normal.  Behavior: Behavior normal.     Labs reviewed: Recent Labs    08/03/18 2036 09/15/18 10/05/18 0922  NA 134* 140 138  K 3.6 4.2 3.8  CL 106 104 103  CO2 20* 25 24  GLUCOSE 159*  --  122*  BUN 18 15 12   CREATININE 1.07 0.7 0.71  CALCIUM 9.0 8.8 8.9   Recent Labs    12/03/17 06/10/18  AST 13* 12*  ALT 17 18  ALKPHOS 77 89  PROT  --  6.3  ALBUMIN  --  4.1   Recent Labs    08/03/18 2036 08/05/18 0427 09/15/18 10/05/18 0922  WBC 18.0* 10.2 6.6 6.5  NEUTROABS  --   --   --  4.1  HGB 14.1 12.8* 12.8* 13.3  HCT 46.6 40.7 39* 43.5  MCV 92.6 91.9  --  93.1  PLT 275 215 196 217   Lab Results  Component Value Date   TSH 1.70 12/03/2017   Lab Results  Component Value Date   HGBA1C 6.3 (H) 08/04/2018   Lab Results  Component Value Date   CHOL 95 08/04/2018   HDL 31 (L) 08/04/2018   LDLCALC 47 08/04/2018   TRIG 84 08/04/2018   CHOLHDL 3.1 08/04/2018    Significant Diagnostic Results in last 30 days:  No results found.  Assessment/Plan Hypertension Blood pressure is controlled, continue Lisinopril 11m qd, Metoprolol 100mg  qd.   Progressive supranuclear palsy (HCC) F/u neurology, frequent falling, continue SNF [facility] for safety and care assistance, continue  Sinemet qid.   Cognitive impairment Continue SNF [facility] for safety and care assistance, continue Donepezil 10mg  qd.   Unsteady gait Continue w/c for mobility, contributory to falling.     Family/ staff Communication: Plan of care reviewed with the patient and charge nurse.   Labs/tests ordered:  none  Time spend 25 minutes.

## 2018-11-26 NOTE — Assessment & Plan Note (Signed)
Continue SNF [facility] for safety and care assistance, continue Donepezil 10mg qd.  

## 2018-11-26 NOTE — Assessment & Plan Note (Signed)
Blood pressure is controlled, continue Lisinopril 65m qd, Metoprolol 100mg  qd.

## 2018-11-26 NOTE — Assessment & Plan Note (Signed)
F/u neurology, frequent falling, continue SNF [facility] for safety and care assistance, continue Sinemet qid.

## 2018-12-08 ENCOUNTER — Ambulatory Visit: Payer: BLUE CROSS/BLUE SHIELD | Admitting: Cardiovascular Disease

## 2018-12-23 ENCOUNTER — Encounter: Payer: Self-pay | Admitting: Nurse Practitioner

## 2018-12-23 ENCOUNTER — Non-Acute Institutional Stay: Payer: BLUE CROSS/BLUE SHIELD | Admitting: Nurse Practitioner

## 2018-12-23 DIAGNOSIS — G231 Progressive supranuclear ophthalmoplegia [Steele-Richardson-Olszewski]: Secondary | ICD-10-CM | POA: Diagnosis not present

## 2018-12-23 DIAGNOSIS — R4189 Other symptoms and signs involving cognitive functions and awareness: Secondary | ICD-10-CM

## 2018-12-23 DIAGNOSIS — K5901 Slow transit constipation: Secondary | ICD-10-CM

## 2018-12-23 DIAGNOSIS — I10 Essential (primary) hypertension: Secondary | ICD-10-CM

## 2018-12-23 NOTE — Assessment & Plan Note (Signed)
Blood pressure is controlled, continue Lisinopril 10mg  qd, Metoprolol 100mg  qd.

## 2018-12-23 NOTE — Assessment & Plan Note (Signed)
Stable, continue Senokot S I qod.  

## 2018-12-23 NOTE — Assessment & Plan Note (Signed)
Frequent falling, ambulates with walker, close supervision needed for safety. Continue Sinemet 25/100mg  11/2 tab qid, Requip 0.25mg  qd.

## 2018-12-23 NOTE — Assessment & Plan Note (Signed)
Continue SNF [facility] for safety and care assistance, continue Donepezil 10mg qd.  

## 2018-12-23 NOTE — Progress Notes (Signed)
Location:  [facility] Room Number: 48 Place of Service:  SNF (31) Provider:  Chipper Oman  NP  [name], [name], NP  Patient Care Team: [name], [name], NP as PCP - General (Internal Medicine)  Extended Emergency Contact Information Primary Emergency Contact: Ebbs,Frank Address: [address]          [place], [place] [address] D[place] of [place] Home Phone: [phone] Mobile Phone: [phone] Relation: Brother  Code Status:  DNR Goals of care: Advanced Directive information Advanced Directives 12/23/2018  Does Patient Have a Medical Advance Directive? Yes  Type of Estate agent of [place];Out of facility DNR (pink MOST or yellow form)  Does patient want to make changes to medical advance directive? No - Patient declined  Copy of Healthcare Power of Attorney in Chart? Yes - validated most recent copy scanned in chart (See row information)  Would patient like information on creating a medical advance directive? -  Pre-existing out of facility DNR order (yellow form or pink MOST form) Yellow form placed in chart (order not valid for inpatient use)     Chief Complaint  Patient presents with  . Medical Management of Chronic Issues    HPI:  Pt is a 63 y.o. male seen today for medical management of chronic diseases.     The patient resides in SNF [facility] for safety and care assistance, frequent falling, ambulates with walker, on Requip 0.25mg  qd, Sinemet 25/100 11/2 tab qid for progressive  supranuclear palsy, he exhibits difficulty moving eyes, slowing of movement/speech, dementia. He takes Donepezil 10mg  qd to preserve memory. HTN, blood pressure is controlled on Lisinopril 10mg  qd, Metoprolol 100mg  qd. No constipation while taking Senokot S qod.    Past Medical History:  Diagnosis Date  . Adult onset fluency disorder   . Coronary artery disease    a. s/p stent left anterior descending artery remotely (per 2017 note, "about 4 years"  prior). b. Normal nuc 05/2015.  . Diabetes mellitus type 2, controlled (HCC) 01/18/2016  . Dysphagia 2018  . Gait instability 2018  . History of falling   . Hypercholesterolemia   . Hypertension   . Memory change 2018  . Parkinson's disease (HCC) 2018  . Weight loss 2017   intentional   Past Surgical History:  Procedure Laterality Date  . CAROTID STENT  2012   in left anterior descending artery past EF 55%  . nuclear stress test  06/17/2015   Gated SPECT images reveal normal LV systolic function. EF 55%. Comparing the rest & stress SPECT images, no ischemia. Gated images are normal. Dr. Vernell Barrier, MD [place], [place]     Allergies  Allergen Reactions  . Codeine     Unknown     Outpatient Encounter Medications as of 12/23/2018  Medication Sig  . aspirin 81 MG chewable tablet Chew 81 mg by mouth daily.   [name] atorvastatin (LIPITOR) 40 MG tablet Take 40 mg by mouth at bedtime. \  . carbidopa-levodopa (SINEMET IR) 25-100 MG tablet Take 1.5 tablets by mouth 4 (four) times daily.   . chlorhexidine (PERIDEX) 0.12 % solution Use as directed 5 mLs in the mouth or throat at bedtime.  . donepezil (ARICEPT) 10 MG tablet Take 10 mg by mouth at bedtime.  [name] lisinopril (PRINIVIL,ZESTRIL) 10 MG tablet Take 10 mg by mouth daily.   . metoprolol succinate (TOPROL-XL) 100 MG 24 hr tablet Take 100 mg by mouth daily.   [name] rOPINIRole (REQUIP) 0.25 MG  tablet Take 0.25 mg by mouth at bedtime.   . sennosides-docusate sodium (SENOKOT-S) 8.6-50 MG tablet Take 1 tablet by mouth every other day.    No facility-administered encounter medications on file as of 12/23/2018.    ROS was provided with assistance of staff.  Review of Systems  Constitutional: Negative for activity change, appetite change, chills, diaphoresis, fatigue, fever and unexpected weight change.  HENT: Positive for hearing loss and trouble swallowing. Negative for congestion and voice change.   Respiratory: Negative for cough, shortness of  breath and wheezing.   Cardiovascular: Negative for chest pain, palpitations and leg swelling.  Gastrointestinal: Negative for abdominal distention, abdominal pain, constipation, diarrhea, nausea and vomiting.  Genitourinary: Negative for difficulty urinating, dysuria and urgency.  Musculoskeletal: Positive for gait problem.  Skin: Negative for color change and pallor.  Neurological: Negative for dizziness, tremors, facial asymmetry, speech difficulty and headaches.       Slow movement/speech, memory difficulty, difficulty eye movement, unsteady gait.   Psychiatric/Behavioral: Negative for agitation, behavioral problems, hallucinations and sleep disturbance. The patient is not nervous/anxious.     Immunization History  Administered Date(s) Administered  . Influenza Whole 06/20/2018  . Influenza-Unspecified 07/08/2017  . PPD Test 01/15/2017  . Pneumococcal Polysaccharide-23 08/05/2018  . Td 09/17/2016   Pertinent  Health Maintenance Due  Topic Date Due  . COLONOSCOPY  06/03/2004  . OPHTHALMOLOGY EXAM  10/08/2017  . FOOT EXAM  01/21/2018  . HEMOGLOBIN A1C  02/02/2019  . INFLUENZA VACCINE  04/18/2019   Fall Risk  01/21/2017  Falls in the past year? Yes  Number falls in past yr: 2 or more  Injury with Fall? Yes  Risk Factor Category  High Fall Risk  Risk for fall due to : History of fall(s);Impaired balance/gait;Impaired mobility  Follow up Falls evaluation completed   Functional Status Survey:    Vitals:   12/23/18 0945  BP: 120/68  Pulse: 61  Resp: 18  Temp: 98.2 F (36.8 C)  SpO2: 98%  Weight: 242 lb 9.6 oz (110 kg)  Height:  (1.803 m)   Body mass index is 33.84 kg/m. Physical Exam Vitals signs and nursing note reviewed.  Constitutional:      General: He is not in acute distress.    Appearance: Normal appearance. He is obese. He is not ill-appearing, toxic-appearing or diaphoretic.  HENT:     Head: Normocephalic and atraumatic.     Nose: Nose normal.      Mouth/Throat:     Mouth: Mucous membranes are moist.  Eyes:     Extraocular Movements: Extraocular movements intact.     Conjunctiva/sclera: Conjunctivae normal.     Pupils: Pupils are equal, round, and reactive to light.     Comments: Slow eye movement.   Neck:     Musculoskeletal: Normal range of motion and neck supple.  Cardiovascular:     Rate and Rhythm: Normal rate and regular rhythm.     Heart sounds: No murmur.  Pulmonary:     Effort: Pulmonary effort is normal.     Breath sounds: No wheezing, rhonchi or rales.  Abdominal:     General: There is no distension.     Palpations: Abdomen is soft.     Tenderness: There is no abdominal tenderness. There is no guarding or rebound.  Musculoskeletal:     Right lower leg: No edema.     Left lower leg: No edema.  Skin:    General: Skin is warm and dry.  Neurological:  General: No focal deficit present.     Mental Status: He is alert. Mental status is at baseline.     Cranial Nerves: No cranial nerve deficit.     Motor: No weakness.     Coordination: Coordination abnormal.     Gait: Gait abnormal.     Comments: Oriented to person and place. Slow movement/speech. Difficulty eye movement. Unsteady gait.   Psychiatric:        Mood and Affect: Mood normal.        Behavior: Behavior normal.     Labs reviewed: Recent Labs    08/03/18 2036 09/15/18 10/05/18 0922  NA 134* 140 138  K 3.6 4.2 3.8  CL 106 104 103  CO2 20* 25 24  GLUCOSE 159*  --  122*  BUN 18 15 12   CREATININE 1.07 0.7 0.71  CALCIUM 9.0 8.8 8.9   Recent Labs    06/10/18  AST 12*  ALT 18  ALKPHOS 89  PROT 6.3  ALBUMIN 4.1   Recent Labs    08/03/18 2036 08/05/18 0427 09/15/18 10/05/18 0922  WBC 18.0* 10.2 6.6 6.5  NEUTROABS  --   --   --  4.1  HGB 14.1 12.8* 12.8* 13.3  HCT 46.6 40.7 39* 43.5  MCV 92.6 91.9  --  93.1  PLT 275 215 196 217   Lab Results  Component Value Date   TSH 1.70 12/03/2017   Lab Results  Component Value Date    HGBA1C 6.3 (H) 08/04/2018   Lab Results  Component Value Date   CHOL 95 08/04/2018   HDL 31 (L) 08/04/2018   LDLCALC 47 08/04/2018   TRIG 84 08/04/2018   CHOLHDL 3.1 08/04/2018    Significant Diagnostic Results in last 30 days:  No results found.  Assessment/Plan Progressive supranuclear palsy (HCC) Frequent falling, ambulates with walker, close supervision needed for safety. Continue Sinemet 25/100mg  11/2 tab qid, Requip 0.25mg  qd.   Cognitive impairment Continue SNF [facility] for safety and care assistance, continue Donepezil 10mg  qd.   Constipation Stable, continue Senokot S I qod  Hypertension Blood pressure is controlled, continue Lisinopril 10mg  qd, Metoprolol 100mg  qd.      Family/ staff Communication: plan of care reviewed with the patient and charge nurse.   Labs/tests ordered:  none  Time spend 25 minutes.

## 2019-01-16 ENCOUNTER — Encounter: Payer: Self-pay | Admitting: Internal Medicine

## 2019-01-16 NOTE — Progress Notes (Signed)
Opened in error

## 2019-01-20 ENCOUNTER — Encounter: Payer: Self-pay | Admitting: Internal Medicine

## 2019-01-20 ENCOUNTER — Non-Acute Institutional Stay: Payer: BLUE CROSS/BLUE SHIELD | Admitting: Internal Medicine

## 2019-01-20 DIAGNOSIS — R296 Repeated falls: Secondary | ICD-10-CM

## 2019-01-20 DIAGNOSIS — I10 Essential (primary) hypertension: Secondary | ICD-10-CM | POA: Diagnosis not present

## 2019-01-20 DIAGNOSIS — R42 Dizziness and giddiness: Secondary | ICD-10-CM | POA: Diagnosis not present

## 2019-01-20 DIAGNOSIS — G20 Parkinson's disease: Secondary | ICD-10-CM

## 2019-01-20 NOTE — Progress Notes (Signed)
Location:  [facility] Room Number: 4/B Place of Service:  SNF (31) Provider:Salam Chesterfield L,MD   Mahlon GammonGupta, Santana Edell L, MD  Patient Care Team: Mahlon GammonGupta, Chandlar Staebell L, MD as PCP - General (Internal Medicine) [name], [name], NP as Nurse Practitioner (Internal Medicine)  Extended Emergency Contact Information Primary Emergency Contact: Galbreath,Frank Address: [address]          [place], [place] [address] [place] Home Phone: [phone] Mobile Phone: [phone] Relation: Brother  Code Status: DNR  Goals of care: Advanced Directive information Advanced Directives 01/20/2019  Does Patient Have a Medical Advance Directive? Yes  Type of Estate agent[place]vance Directive Healthcare Power of [place];Out of facility DNR (pink MOST or yellow form)  Does patient want to make changes to medical advance directive? No - Patient declined  Copy of Healthcare Power of Attorney in Chart? Yes - validated most recent copy scanned in chart (See row information)  Would patient like information on creating a medical advance directive? -  Pre-existing out of facility DNR order (yellow form or pink MOST form) Yellow form placed in chart (order not valid for inpatient use)     Chief Complaint  Patient presents with  . Medical Management of Chronic Issues    Routine visit     HPI:  Pt is a 63 y.o. male seen today for medical management of chronic diseases.   Patient also has had number of falls in spite of moving to SNF.  Patient has a history of recurrent falls , Parkinson disease diagnosed in 2018, diabetes mellitus, coronary artery disease, hypertension, hyperlipidemia, cognitive impairment  [place] ago as he was having recurrent falls.  And was requiring higher level of care.  But he has had number of falls since he has been in SNF. He had another fall 2 days ago.  He did not sustain any injuries.  He states that he was standing with a walker near his closet  and suddenly felt dizzy and fell.   He states that he feels dizzy a lot when he is standing up.  He denies any vertigo is more that he feels that he is going to pass out.  Denies any chest pain, cough, fever,SOB His weight is stable.  His appetite is good.  No new nursing issues.     Past Medical History:  Diagnosis Date  . Adult onset fluency disorder   . Coronary artery disease    a. s/p stent left anterior descending artery remotely (per 2017 note, "about 4 years" prior). b. Normal nuc 05/2015.  . Diabetes mellitus type 2, controlled (HCC) 01/18/2016  . Dysphagia 2018  . Gait instability 2018  . History of falling   . Hypercholesterolemia   . Hypertension   . Memory change 2018  . Parkinson's disease (HCC) 2018  . Weight loss 2017   intentional   Past Surgical History:  Procedure Laterality Date  . CAROTID STENT  2012   in left anterior descending artery past EF 55%  . nuclear stress test  06/17/2015   Gated SPECT images reveal normal LV systolic function. EF 55%. Comparing the rest & stress SPECT images, no ischemia. Gated images are normal. Dr. Vernell BarrierWaheed Akhtar, MD [place]     Allergies  Allergen Reactions  . Codeine     Unknown     Outpatient Encounter Medications as of 01/20/2019  Medication Sig  . aspirin 81 MG chewable tablet Chew 81 mg by  mouth daily.   [name] atorvastatin (LIPITOR) 40 MG tablet Take 40 mg by mouth at bedtime. \  . carbidopa-levodopa (SINEMET IR) 25-100 MG tablet Take 1.5 tablets by mouth 4 (four) times daily.   . chlorhexidine (PERIDEX) 0.12 % solution Use as directed 5 mLs in the mouth or throat at bedtime.  . donepezil (ARICEPT) 10 MG tablet Take 10 mg by mouth at bedtime.  [name] lisinopril (PRINIVIL,ZESTRIL) 10 MG tablet Take 10 mg by mouth daily.   . metoprolol succinate (TOPROL-XL) 100 MG 24 hr tablet Take 100 mg by mouth daily.   [name] rOPINIRole (REQUIP) 0.25 MG tablet Take 0.25 mg by mouth at bedtime.   . sennosides-docusate sodium (SENOKOT-S)  8.6-50 MG tablet Take 1 tablet by mouth every other day.    No facility-administered encounter medications on file as of 01/20/2019.     Review of Systems  Constitutional: Negative.   HENT: Negative.   Respiratory: Negative.   Cardiovascular: Negative.   Gastrointestinal: Negative.   Genitourinary: Negative.   Musculoskeletal: Negative.   Neurological: Positive for dizziness and weakness.  Psychiatric/Behavioral: Negative.     Immunization History  Administered Date(s) Administered  . Influenza Whole 06/20/2018  . Influenza-Unspecified 07/08/2017  . PPD Test 01/15/2017  . Pneumococcal Polysaccharide-23 08/05/2018  . Td 09/17/2016   Pertinent  Health Maintenance Due  Topic Date Due  . COLONOSCOPY  06/03/2004  . OPHTHALMOLOGY EXAM  10/08/2017  . FOOT EXAM  01/21/2018  . HEMOGLOBIN A1C  02/02/2019  . INFLUENZA VACCINE  04/18/2019   Fall Risk  01/21/2017  Falls in the past year? Yes  Number falls in past yr: 2 or more  Injury with Fall? Yes  Risk Factor Category  High Fall Risk  Risk for fall due to : History of fall(s);Impaired balance/gait;Impaired mobility  Follow up Falls evaluation completed   Functional Status Survey:    Vitals:   01/20/19 0938  BP: (!) 151/62  Pulse: (!) 54  Resp: 18  Temp: 98.1 F (36.7 C)  SpO2: 96%  Weight: 242 lb 9.6 oz (110 kg)  Height:  (1.803 m)   Body mass index is 33.84 kg/m. Physical Exam Vitals signs reviewed.  Constitutional:      Appearance: Normal appearance.  HENT:     Head: Normocephalic.     Nose: Nose normal.     Mouth/Throat:     Mouth: Mucous membranes are moist.  Eyes:     Pupils: Pupils are equal, round, and reactive to light.  Neck:     Musculoskeletal: Neck supple.  Cardiovascular:     Rate and Rhythm: Normal rate and regular rhythm.     Pulses: Normal pulses.     Heart sounds: Normal heart sounds.  Pulmonary:     Effort: Pulmonary effort is normal.     Breath sounds: Normal breath sounds.   Abdominal:     General: Abdomen is flat. Bowel sounds are normal.     Palpations: Abdomen is soft.  Musculoskeletal:        General: No swelling.  Skin:    General: Skin is warm and dry.  Neurological:     General: No focal deficit present.     Mental Status: He is alert and oriented to person, place, and time.  Psychiatric:        Mood and Affect: Mood normal.        Behavior: Behavior normal.        Thought Content: Thought content normal.  Labs reviewed: Recent Labs    08/03/18 2036 09/15/18 10/05/18 0922  NA 134* 140 138  K 3.6 4.2 3.8  CL 106 104 103  CO2 20* 25 24  GLUCOSE 159*  --  122*  BUN 18 15 12   CREATININE 1.07 0.7 0.71  CALCIUM 9.0 8.8 8.9   Recent Labs    06/10/18  AST 12*  ALT 18  ALKPHOS 89  PROT 6.3  ALBUMIN 4.1   Recent Labs    08/03/18 2036 08/05/18 0427 09/15/18 10/05/18 0922  WBC 18.0* 10.2 6.6 6.5  NEUTROABS  --   --   --  4.1  HGB 14.1 12.8* 12.8* 13.3  HCT 46.6 40.7 39* 43.5  MCV 92.6 91.9  --  93.1  PLT 275 215 196 217   Lab Results  Component Value Date   TSH 1.70 12/03/2017   Lab Results  Component Value Date   HGBA1C 6.3 (H) 08/04/2018   Lab Results  Component Value Date   CHOL 95 08/04/2018   HDL 31 (L) 08/04/2018   LDLCALC 47 08/04/2018   TRIG 84 08/04/2018   CHOLHDL 3.1 08/04/2018    Significant Diagnostic Results in last 30 days:  No results found.  Assessment/Plan Parkinson's disease with Unsteady Gait and Recurrent Falls. And c/o Dizziness before the Fall Check For Orthostatic Hypotension Decrease his Metoprolol to 75 mg I am worried that he is getting Orthostatic with his h/o Parkinson leading to falls He is on Requip and Sinemet He has Follow up with Neurology Pending   Diabetes Mellitus Not on any Meds Repeat A1C  CAD Was seen by Cardiologist in 11/19 when he was admitted with Syncope. Work up was negative Echo was negative for any acute process Essential hypertension With these Falls and  Bradycardia Will decrease his Metoprolol to 75 mg Continue to check his BP Loose control of BP  Hypercholesterolemia Last LDL was 47 in 11/19 Repeat Fasting Lipids  Cognitive impairment On Aricept       Family/ staff Communication:   Labs/tests ordered:  CBC,CMP, Fasting Lipids, A1C  Total time spent in this patient care encounter was  25_  minutes; greater than 50% of the visit spent counseling patient and staff, reviewing records , Labs and coordinating care for problems addressed at this encounter.

## 2019-01-22 LAB — HEPATIC FUNCTION PANEL
ALT: 16 (ref 10–40)
AST: 11 — AB (ref 14–40)

## 2019-01-22 LAB — BASIC METABOLIC PANEL
BUN: 15 (ref 4–21)
Creatinine: 0.7 (ref 0.6–1.3)
Glucose: 108
Potassium: 4 (ref 3.4–5.3)
Sodium: 141 (ref 137–147)

## 2019-01-22 LAB — HEMOGLOBIN A1C: Hemoglobin A1C: 6.1

## 2019-02-03 ENCOUNTER — Non-Acute Institutional Stay: Payer: BLUE CROSS/BLUE SHIELD | Admitting: Internal Medicine

## 2019-02-03 ENCOUNTER — Encounter: Payer: Self-pay | Admitting: Internal Medicine

## 2019-02-03 DIAGNOSIS — E119 Type 2 diabetes mellitus without complications: Secondary | ICD-10-CM | POA: Diagnosis not present

## 2019-02-03 DIAGNOSIS — G231 Progressive supranuclear ophthalmoplegia [Steele-Richardson-Olszewski]: Secondary | ICD-10-CM | POA: Diagnosis not present

## 2019-02-03 DIAGNOSIS — R296 Repeated falls: Secondary | ICD-10-CM

## 2019-02-03 DIAGNOSIS — G20 Parkinson's disease: Secondary | ICD-10-CM

## 2019-02-03 DIAGNOSIS — I10 Essential (primary) hypertension: Secondary | ICD-10-CM | POA: Diagnosis not present

## 2019-02-03 NOTE — Progress Notes (Signed)
Location:  [facility] Room Number: 4/B Place of Service:  SNF (31) Provider:  L.MD   Mahlon Gammon, MD  Patient Care Team: Mahlon Gammon, MD as PCP - General (Internal Medicine) [name], [name], NP as Nurse Practitioner (Internal Medicine)  Extended Emergency Contact Information Primary Emergency Contact: Doerner,Frank Address: [address]          [place], [place] [address] [place] of [place] Home Phone: [phone] Mobile Phone: [phone] Relation: Brother  Code Status: DNR  Goals of care: Advanced Directive information Advanced Directives 02/03/2019  Does Patient Have a Medical Advance Directive? No  Type of Estate agent of [place];Out of facility DNR (pink MOST or yellow form)  Does patient want to make changes to medical advance directive? No - Patient declined  Copy of Healthcare Power of Attorney in Chart? Yes - validated most recent copy scanned in chart (See row information)  Would patient like information on creating a medical advance directive? No - Patient declined  Pre-existing out of facility DNR order (yellow form or pink MOST form) Yellow form placed in chart (order not valid for inpatient use)     Chief Complaint  Patient presents with  . Acute Visit    Falls with injury     HPI:  Pt is a 63 y.o. male seen today for an acute visit for Recurrent Falls  Patient has a history of recurrent falls,Parkinson disease diagnosed in 2018 with ? PSP ,diabetes mellitus, coronary artery disease,hypertension, hyperlipidemia, cognitive impairment  Patient moved to SNF for recurrent Falls. But he continues to have falls including 2 more this week One was at night when he got confused and fell . The other one was when he just fell in the bathroom.  I talked to the nurses and they said that they have reinforced patient to call for help but he usually forgets. I had reduced dose of his metoprolol on my last  visit to 75 mg to see if that helps.AS he was c/o Dizziness  He was not orthostatic.  He is follow-up with neurology still pending He is not c/o Dizziness today   Past Medical History:  Diagnosis Date  . Adult onset fluency disorder   . Coronary artery disease    a. s/p stent left anterior descending artery remotely (per 2017 note, "about 4 years" prior). b. Normal nuc 05/2015.  . Diabetes mellitus type 2, controlled (HCC) 01/18/2016  . Dysphagia 2018  . Gait instability 2018  . History of falling   . Hypercholesterolemia   . Hypertension   . Memory change 2018  . Parkinson's disease (HCC) 2018  . Weight loss 2017   intentional   Past Surgical History:  Procedure Laterality Date  . CAROTID STENT  2012   in left anterior descending artery past EF 55%  . nuclear stress test  06/17/2015   Gated SPECT images reveal normal LV systolic function. EF 55%. Comparing the rest & stress SPECT images, no ischemia. Gated images are normal. Dr. Vernell Barrier, MD [place], [place]     Allergies  Allergen Reactions  . Codeine     Unknown     Outpatient Encounter Medications as of 02/03/2019  Medication Sig  . aspirin 81 MG chewable tablet Chew 81 mg by mouth daily.   [name] atorvastatin (LIPITOR) 40 MG tablet Take 40 mg by mouth at bedtime. \  . carbidopa-levodopa (SINEMET IR) 25-100 MG tablet Take 1.5 tablets by mouth 4 (four) times  daily.   . donepezil (ARICEPT) 10 MG tablet Take 10 mg by mouth at bedtime.  [name] lisinopril (PRINIVIL,ZESTRIL) 10 MG tablet Take 10 mg by mouth daily.   . Metoprolol Succinate 50 MG CS24 Take 75 mg by mouth daily. Take 1 1/2 tabs once a day  . rOPINIRole (REQUIP) 0.25 MG tablet Take 0.25 mg by mouth at bedtime.   . sennosides-docusate sodium (SENOKOT-S) 8.6-50 MG tablet Take 1 tablet by mouth every other day.   . [DISCONTINUED] chlorhexidine (PERIDEX) 0.12 % solution Use as directed 5 mLs in the mouth or throat at bedtime.  . [DISCONTINUED] metoprolol succinate  (TOPROL-XL) 100 MG 24 hr tablet Take 100 mg by mouth daily.    No facility-administered encounter medications on file as of 02/03/2019.     Review of Systems  Constitutional: Negative.   HENT: Negative.   Respiratory: Negative.   Cardiovascular: Negative.   Gastrointestinal: Negative.   Genitourinary: Negative.   Musculoskeletal: Negative.   Neurological: Positive for weakness. Negative for dizziness and light-headedness.  Psychiatric/Behavioral: Negative.     Immunization History  Administered Date(s) Administered  . Influenza Whole 06/20/2018  . Influenza-Unspecified 07/08/2017  . PPD Test 01/15/2017  . Pneumococcal Polysaccharide-23 08/05/2018  . Td 09/17/2016   Pertinent  Health Maintenance Due  Topic Date Due  . COLONOSCOPY  06/03/2004  . OPHTHALMOLOGY EXAM  10/08/2017  . FOOT EXAM  01/21/2018  . HEMOGLOBIN A1C  02/02/2019  . INFLUENZA VACCINE  04/18/2019   Fall Risk  01/21/2017  Falls in the past year? Yes  Number falls in past yr: 2 or more  Injury with Fall? Yes  Risk Factor Category  High Fall Risk  Risk for fall due to : History of fall(s);Impaired balance/gait;Impaired mobility  Follow up Falls evaluation completed   Functional Status Survey:    Vitals:   02/03/19 1027  BP: 128/79  Pulse: 63  Resp: (!) 22  Temp: 98.7 F (37.1 C)  SpO2: 96%  Weight: 244 lb 12.8 oz (111 kg)  Height: 5\' 11"  (1.803 m)   Body mass index is 34.14 kg/m. Physical Exam Vitals signs reviewed.  Constitutional:      Appearance: Normal appearance.  HENT:     Head: Normocephalic.     Nose: Nose normal.     Mouth/Throat:     Mouth: Mucous membranes are moist.     Pharynx: Oropharynx is clear.  Eyes:     Pupils: Pupils are equal, round, and reactive to light.  Neck:     Musculoskeletal: Neck supple.  Cardiovascular:     Rate and Rhythm: Normal rate and regular rhythm.     Pulses: Normal pulses.     Heart sounds: Normal heart sounds.  Pulmonary:     Effort: Pulmonary  effort is normal. No respiratory distress.     Breath sounds: Normal breath sounds.  Abdominal:     General: Abdomen is flat. Bowel sounds are normal. There is no distension.     Palpations: Abdomen is soft.     Tenderness: There is no abdominal tenderness.  Musculoskeletal:        General: No swelling.  Skin:    General: Skin is warm and dry.  Neurological:     General: No focal deficit present.     Mental Status: He is alert and oriented to person, place, and time.     Comments: Does have Flat Effect. No Rigidity in Extremities. Mild tremor  Psychiatric:  Mood and Affect: Mood normal.        Thought Content: Thought content normal.        Judgment: Judgment normal.     Labs reviewed: Recent Labs    08/03/18 2036 09/15/18 10/05/18 0922 01/22/19  NA 134* 140 138 141  K 3.6 4.2 3.8 4.0  CL 106 104 103  --   CO2 20* 25 24  --   GLUCOSE 159*  --  122*  --   BUN 18 15 12 15   CREATININE 1.07 0.7 0.71 0.7  CALCIUM 9.0 8.8 8.9  --    Recent Labs    06/10/18 01/22/19  AST 12* 11*  ALT 18 16  ALKPHOS 89  --   PROT 6.3  --   ALBUMIN 4.1  --    Recent Labs    08/03/18 2036 08/05/18 0427 09/15/18 10/05/18 0922  WBC 18.0* 10.2 6.6 6.5  NEUTROABS  --   --   --  4.1  HGB 14.1 12.8* 12.8* 13.3  HCT 46.6 40.7 39* 43.5  MCV 92.6 91.9  --  93.1  PLT 275 215 196 217   Lab Results  Component Value Date   TSH 1.70 12/03/2017   Lab Results  Component Value Date   HGBA1C 6.1 01/22/2019   Lab Results  Component Value Date   CHOL 95 08/04/2018   HDL 31 (L) 08/04/2018   LDLCALC 47 08/04/2018   TRIG 84 08/04/2018   CHOLHDL 3.1 08/04/2018    Significant Diagnostic Results in last 30 days:  No results found.  Assessment/Plan  Parkinson's disease with ? PSPwith Unsteady Gait and Recurrent Falls.  No Dizziness this time. Mostly unsteady gait and fall Neurology Consult pending Will have therapy to evaluate Reinforce patient to call for help He is on Requip  and Sinement  Diabetes Mellitus Repeat A1C was 6.1  Not on any meds  CAD Was seen by Cardiologist in 11/19 when he was admitted with Syncope. Work up was negative Echo was negative for any acute process On Beta blocker Essential hypertension With these Falls and Bradycardia I had decreased his Metoprolol to 75 mg His BP seems controlled and no more Bradycardia Continue to check his BP Loose control of BP  Hypercholesterolemia Last LDL was 47 in 11/19   Cognitive impairment On Aricept  Family/ staff Communication:   Labs/tests ordered:   Total time spent in this patient care encounter was  _25  minutes; greater than 50% of the visit spent counseling patient and staff, reviewing records , Labs and coordinating care for problems addressed at this encounter.

## 2019-02-16 ENCOUNTER — Encounter: Payer: Self-pay | Admitting: Internal Medicine

## 2019-02-16 ENCOUNTER — Non-Acute Institutional Stay: Payer: BLUE CROSS/BLUE SHIELD | Admitting: Nurse Practitioner

## 2019-02-16 ENCOUNTER — Encounter: Payer: Self-pay | Admitting: Nurse Practitioner

## 2019-02-16 DIAGNOSIS — I10 Essential (primary) hypertension: Secondary | ICD-10-CM

## 2019-02-16 DIAGNOSIS — G231 Progressive supranuclear ophthalmoplegia [Steele-Richardson-Olszewski]: Secondary | ICD-10-CM

## 2019-02-16 DIAGNOSIS — K5901 Slow transit constipation: Secondary | ICD-10-CM | POA: Diagnosis not present

## 2019-02-16 DIAGNOSIS — R4189 Other symptoms and signs involving cognitive functions and awareness: Secondary | ICD-10-CM

## 2019-02-16 NOTE — Progress Notes (Signed)
Location:   [facility] Room Number: 4/B Place of Service:  SNF (31) [facility]  Mahlon GammonGupta, Anjali L, MD  Patient Care Team: Mahlon GammonGupta, Anjali L, MD as PCP - General (Internal Medicine) Dailin Sosnowski X, NP as Nurse Practitioner (Internal Medicine)  Extended Emergency Contact Information Primary Emergency Contact: Parisien,Frank Address: [address]          [place], [place] [address] [place] Home Phone: [phone] Mobile Phone: [phone] Relation: Brother  Code Status:  DNR Goals of care: Advanced Directive information Advanced Directives 02/16/2019  Does Patient Have a Medical Advance Directive? Yes  Type of Estate agentAdvance Directive Healthcare Power of [place];Out of facility DNR (pink MOST or yellow form)  Does patient want to make changes to medical advance directive? No - Patient declined  Copy of Healthcare Power of Attorney in Chart? Yes - validated most recent copy scanned in chart (See row information)  Would patient like information on creating a medical advance directive? No - Patient declined  Pre-existing out of facility DNR order (yellow form or pink MOST form) Yellow form placed in chart (order not valid for inpatient use)     Chief Complaint  Patient presents with  . Medical Management of Chronic Issues    Routine visit   . Health Maintenance    Hepatitis C screening, Colonoscopy, eye exam, foot exam     HPI:  Pt is a 63 y.o. male seen today for medical management of chronic diseases.    [facility]. Hx of progressive supranuclear palsy, frequent falling, on Requip 0.25mg  qd, Sinemet 25/100mg  11/2 tab qid. His memory is preserved on Donepezil 10mg  qd. HTN, blood pressure is controlled on Metoprolol 75mg  qd, Lisinopril 10mg  qd. No constipation, on Senokot S qod.   Past Medical History:  Diagnosis Date  . Adult onset fluency disorder   . Coronary artery disease    a. s/p stent left  anterior descending artery remotely (per 2017 note, "about 4 years" prior). b. Normal nuc 05/2015.  . Diabetes mellitus type 2, controlled (HCC) 01/18/2016  . Dysphagia 2018  . Gait instability 2018  . History of falling   . Hypercholesterolemia   . Hypertension   . Memory change 2018  . Parkinson's disease (HCC) 2018  . Weight loss 2017   intentional   Past Surgical History:  Procedure Laterality Date  . CAROTID STENT  2012   in left anterior descending artery past EF 55%  . nuclear stress test  06/17/2015   Gated SPECT images reveal normal LV systolic function. EF 55%. Comparing the rest & stress SPECT images, no ischemia. Gated images are normal. Dr. Vernell BarrierWaheed Akhtar, MD [place]     Allergies  Allergen Reactions  . Codeine     Unknown     Allergies as of 02/16/2019      Reactions   Codeine    Unknown       Medication List       Accurate as of February 16, 2019  5:03 PM. If you have any questions, ask your nurse or doctor.        aspirin 81 MG chewable tablet Chew 81 mg by mouth daily.   atorvastatin 40 MG tablet Commonly known as:  LIPITOR Take 40 mg by mouth at bedtime. \   carbidopa-levodopa 25-100 MG tablet Commonly known as:  SINEMET IR Take 1.5 tablets by mouth 4 (four) times daily. Take  1 and 1/2 tablets four times a day   donepezil 10 MG tablet Commonly known as:  ARICEPT Take 10 mg by mouth at bedtime.   lisinopril 10 MG tablet Commonly known as:  ZESTRIL Take 10 mg by mouth daily.   Metoprolol Succinate 50 MG Cs24 Take 75 mg by mouth daily. Take 1 1/2 tabs once a day   rOPINIRole 0.25 MG tablet Commonly known as:  REQUIP Take 0.25 mg by mouth at bedtime.   sennosides-docusate sodium 8.6-50 MG tablet Commonly known as:  SENOKOT-S Take 1 tablet by mouth every other day.      ROS was provided with assistance of staff.  Review of Systems  Constitutional: Negative for activity change, appetite change, chills, diaphoresis, fatigue, fever and  unexpected weight change.  HENT: Positive for hearing loss. Negative for congestion and voice change.   Eyes: Negative for visual disturbance.  Respiratory: Negative for cough, shortness of breath and wheezing.   Cardiovascular: Negative for chest pain, palpitations and leg swelling.  Gastrointestinal: Negative for abdominal distention, abdominal pain, constipation, diarrhea, nausea and vomiting.  Genitourinary: Negative for difficulty urinating, dysuria, hematuria and urgency.       Urinary leakage.   Musculoskeletal: Positive for gait problem.  Neurological: Positive for tremors. Negative for dizziness, speech difficulty, weakness and headaches.       Slowed speech and body movements  Psychiatric/Behavioral: Negative for agitation, behavioral problems, hallucinations and sleep disturbance. The patient is not nervous/anxious.     Immunization History  Administered Date(s) Administered  . Influenza Whole 06/20/2018  . Influenza-Unspecified 07/08/2017  . PPD Test 01/15/2017  . Pneumococcal Polysaccharide-23 08/05/2018  . Td 09/17/2016   Pertinent  Health Maintenance Due  Topic Date Due  . COLONOSCOPY  06/03/2004  . OPHTHALMOLOGY EXAM  10/08/2017  . FOOT EXAM  01/21/2018  . INFLUENZA VACCINE  04/18/2019  . HEMOGLOBIN A1C  07/25/2019   Fall Risk  01/21/2017  Falls in the past year? Yes  Number falls in past yr: 2 or more  Injury with Fall? Yes  Risk Factor Category  High Fall Risk  Risk for fall due to : History of fall(s);Impaired balance/gait;Impaired mobility  Follow up Falls evaluation completed   Functional Status Survey:    Vitals:   02/16/19 0913  BP: (!) 150/70  Pulse: 72  Resp: 20  Temp: 97.9 F (36.6 C)  SpO2: 96%  Weight: 240 lb 12.8 oz (109.2 kg)  Height: 5\' 11"  (1.803 m)   Body mass index is 33.58 kg/m. Physical Exam Constitutional:      General: He is not in acute distress.    Appearance: Normal appearance. He is obese. He is not ill-appearing,  toxic-appearing or diaphoretic.  HENT:     Head: Normocephalic and atraumatic.     Nose: Nose normal.     Mouth/Throat:     Mouth: Mucous membranes are moist.  Eyes:     Extraocular Movements: Extraocular movements intact.     Conjunctiva/sclera: Conjunctivae normal.     Pupils: Pupils are equal, round, and reactive to light.  Neck:     Musculoskeletal: Normal range of motion.  Cardiovascular:     Rate and Rhythm: Normal rate and regular rhythm.     Heart sounds: No murmur.  Pulmonary:     Effort: Pulmonary effort is normal.     Breath sounds: No wheezing, rhonchi or rales.  Abdominal:     General: Bowel sounds are normal. There is no distension.  Palpations: Abdomen is soft.     Tenderness: There is no abdominal tenderness. There is no right CVA tenderness, left CVA tenderness, guarding or rebound.  Musculoskeletal:     Right lower leg: No edema.     Left lower leg: No edema.  Skin:    General: Skin is warm and dry.  Neurological:     General: No focal deficit present.     Mental Status: He is alert. Mental status is at baseline.     Cranial Nerves: No cranial nerve deficit.     Motor: No weakness.     Coordination: Coordination abnormal.     Gait: Gait abnormal.     Comments: Oriented to person and place. Rigidity in limbs. Slowed speech and body movements. Flat affect.   Psychiatric:        Mood and Affect: Mood normal.        Behavior: Behavior normal.        Thought Content: Thought content normal.     Labs reviewed: Recent Labs    08/03/18 2036 09/15/18 10/05/18 0922 01/22/19  NA 134* 140 138 141  K 3.6 4.2 3.8 4.0  CL 106 104 103  --   CO2 20* 25 24  --   GLUCOSE 159*  --  122*  --   BUN CREATININE 1.07 0.7 0.71 0.7  CALCIUM 9.0 8.8 8.9  --    Recent Labs    06/10/18 01/22/19  AST 12* 11*  ALT 18 16  ALKPHOS 89  --   PROT 6.3  --   ALBUMIN 4.1  --    Recent Labs    08/03/18 2036 08/05/18 0427 09/15/18 10/05/18 0922  WBC 18.0*  10.2 6.6 6.5  NEUTROABS  --   --   --  4.1  HGB 14.1 12.8* 12.8* 13.3  HCT 46.6 40.7 39* 43.5  MCV 92.6 91.9  --  93.1  PLT 275 215 196 217   Lab Results  Component Value Date   TSH 1.70 12/03/2017   Lab Results  Component Value Date   HGBA1C 6.1 01/22/2019   Lab Results  Component Value Date   CHOL 95 08/04/2018   HDL 31 (L) 08/04/2018   LDLCALC 47 08/04/2018   TRIG 84 08/04/2018   CHOLHDL 3.1 08/04/2018    Significant Diagnostic Results in last 30 days:  No results found.  Assessment/Plan  Hypertension Blood pressure is controlled, continue Metoprolol  qd, Lisinopril  qd.   Progressive supranuclear palsy (HCC) Frequent falling, unsteady gait, cognitive impairment. Continue SNF [facility] for safety and care assistance, continue Requip 0.25mg  qd, Sinemet 25/100mg  11/2 tab qid. Close supervision needed for safety.   Cognitive impairment Continue SNF [facility] for safety and care assistance, continue Donepezil  qd for memory.   Constipation Stable, continue Senokot S qod.    Family/ staff Communication:  Plan of care reviewed with the patient and charge nurse.    Labs/tests ordered:  none  Time spend 25 minutes.

## 2019-02-16 NOTE — Assessment & Plan Note (Signed)
Blood pressure is controlled, continue Metoprolol 75mg qd, Lisinopril 10mg qd.  

## 2019-02-16 NOTE — Progress Notes (Signed)
Opened in error

## 2019-02-16 NOTE — Assessment & Plan Note (Signed)
Frequent falling, unsteady gait, cognitive impairment. Continue SNF [facility] for safety and care assistance, continue Requip 0.25mg  qd, Sinemet 25/100mg  11/2 tab qid. Close supervision needed for safety.

## 2019-02-16 NOTE — Assessment & Plan Note (Signed)
Stable, continue Senokot S qod.

## 2019-02-16 NOTE — Assessment & Plan Note (Signed)
Continue SNF [facility] for safety and care assistance, continue Donepezil 10mg qd for memory.  

## 2019-03-13 ENCOUNTER — Non-Acute Institutional Stay: Payer: BC Managed Care – PPO | Admitting: Internal Medicine

## 2019-03-13 ENCOUNTER — Encounter: Payer: Self-pay | Admitting: Internal Medicine

## 2019-03-13 DIAGNOSIS — T148XXA Other injury of unspecified body region, initial encounter: Secondary | ICD-10-CM

## 2019-03-13 DIAGNOSIS — R296 Repeated falls: Secondary | ICD-10-CM

## 2019-03-13 DIAGNOSIS — G231 Progressive supranuclear ophthalmoplegia [Steele-Richardson-Olszewski]: Secondary | ICD-10-CM | POA: Diagnosis not present

## 2019-03-13 DIAGNOSIS — I10 Essential (primary) hypertension: Secondary | ICD-10-CM | POA: Diagnosis not present

## 2019-03-13 NOTE — Progress Notes (Signed)
Location:  [facility] Room Number: 4 Place of Service:  SNF [phone](31) Provider:  Dr. Pollyann SavoyAnjali Jaylia Wells   Scott Fix L, MD  Patient Care Team: Mahlon GammonGupta, Nickolaos Brallier L, MD as PCP - General (Internal Medicine) [name], [name], NP as Nurse Practitioner (Internal Medicine)  Extended Emergency Contact Information Primary Emergency Contact: Atiyeh,Frank Address: [address]          [place], [place] [address] [place] Home Phone: [phone] Mobile Phone: [phone] Relation: Brother  Code Status: DNR Goals of care: Advanced Directive information Advanced Directives 03/13/2019  Does Patient Have a Medical Advance Directive? Yes  Type of Estate agentAdvance Directive Healthcare Power of AngelicaAttorney;Living will;Out of facility DNR (pink MOST or yellow form)  Does patient want to make changes to medical advance directive? No - Patient declined  Copy of Healthcare Power of Attorney in Chart? Yes - validated most recent copy scanned in chart (See row information)  Would patient like information on creating a medical advance directive? -  Pre-existing out of facility DNR order (yellow form or pink MOST form) Yellow form placed in chart (order not valid for inpatient use)     Chief Complaint  Patient presents with  . Acute Visit    Fall.    HPI:  Pt is a 63 y.o. male seen today for an acute visit for Fall this Morning  Patient has a history of recurrent falls,Parkinson disease diagnosed in 2018 with ? PSP ,diabetes mellitus, coronary artery disease,hypertension, hyperlipidemia, cognitive impairment Was moved to SNF for recurrent falls.  But since he has been here he continues to have falls 2 more this week. This morning patient fell again.  This time he sustained a hematoma above his right eye. The nurses have told him many times to call for help but he usually forgets.  I had reduced his metoprolol to 75 mg as he was  previously.complaining of dizziness  He has not been  orthostatic He states his legs just give away and he falls. Denies Any headache.  No other neuro deficit.  No double vision or redness in his eyes Patient was complaining of urinary frequency Which is new ,denied any dysuria  Past Medical History:  Diagnosis Date  . Adult onset fluency disorder   . Coronary artery disease    a. s/p stent left anterior descending artery remotely (per 2017 note, "about 4 years" prior). b. Normal nuc 05/2015.  . Diabetes mellitus type 2, controlled (HCC) 01/18/2016  . Dysphagia 2018  . Gait instability 2018  . History of falling   . Hypercholesterolemia   . Hypertension   . Memory change 2018  . Parkinson's disease (HCC) 2018  . Weight loss 2017   intentional   Past Surgical History:  Procedure Laterality Date  . CAROTID STENT  2012   in left anterior descending artery past EF 55%  . nuclear stress test  06/17/2015   Gated SPECT images reveal normal LV systolic function. EF 55%. Comparing the rest & stress SPECT images, no ischemia. Gated images are normal. Dr. Vernell BarrierWaheed Akhtar, MD [place], [place]     Allergies  Allergen Reactions  . Codeine     Unknown     Outpatient Encounter Medications as of 03/13/2019  Medication Sig  . aspirin 81 MG chewable tablet Chew 81 mg by mouth daily.   [name]. atorvastatin (LIPITOR) 40 MG tablet Take 40 mg by mouth at bedtime. \  . carbidopa-levodopa (SINEMET IR) 25-100 MG tablet Take 1.5  tablets by mouth 4 (four) times daily. Take 1 and 1/2 tablets four times a day  . donepezil (ARICEPT) 10 MG tablet Take 10 mg by mouth at bedtime.  [name] lisinopril (PRINIVIL,ZESTRIL) 10 MG tablet Take 10 mg by mouth daily.   . Metoprolol Succinate 50 MG CS24 Take 75 mg by mouth daily. Take 1 1/2 tabs once a day  . rOPINIRole (REQUIP) 0.25 MG tablet Take 0.25 mg by mouth at bedtime.   . sennosides-docusate sodium (SENOKOT-S) 8.6-50 MG tablet Take 1 tablet by mouth every other day.    No facility-administered encounter medications on file as of  03/13/2019.     Review of Systems  Constitutional: Positive for activity change.  HENT: Negative.   Respiratory: Negative.   Cardiovascular: Negative.   Gastrointestinal: Negative.   Genitourinary: Positive for frequency. Negative for dysuria.  Musculoskeletal: Negative.   Neurological: Positive for weakness. Negative for dizziness.  Psychiatric/Behavioral: Negative.     Immunization History  Administered Date(s) Administered  . Influenza Whole 06/20/2018  . Influenza-Unspecified 07/08/2017  . PPD Test 01/15/2017  . Pneumococcal Polysaccharide-23 08/05/2018  . Td 09/17/2016   Pertinent  Health Maintenance Due  Topic Date Due  . COLONOSCOPY  06/03/2004  . OPHTHALMOLOGY EXAM  10/08/2017  . FOOT EXAM  01/21/2018  . INFLUENZA VACCINE  04/18/2019  . HEMOGLOBIN A1C  07/25/2019   Fall Risk  01/21/2017  Falls in the past year? Yes  Number falls in past yr: 2 or more  Injury with Fall? Yes  Risk Factor Category  High Fall Risk  Risk for fall due to : History of fall(s);Impaired balance/gait;Impaired mobility  Follow up Falls evaluation completed   Functional Status Survey:    Vitals:   03/13/19 1407  BP: 122/78  Pulse: 76  Resp: 18  Temp: (!) 96.9 F (36.1 C)  TempSrc: Oral  SpO2: 98%  Weight: 237 lb 11.2 oz (107.8 kg)  Height: 5\' 11"  (1.803 m)   Body mass index is 33.15 kg/m. Physical Exam Vitals signs reviewed.  HENT:     Head: Normocephalic.     Nose: Nose normal.     Mouth/Throat:     Mouth: Mucous membranes are moist.     Pharynx: Oropharynx is clear.  Eyes:     Pupils: Pupils are equal, round, and reactive to light.     Comments: Has Hematoma over his Right Eye  Neck:     Musculoskeletal: Neck supple.  Cardiovascular:     Rate and Rhythm: Normal rate and regular rhythm.     Pulses: Normal pulses.     Heart sounds: Normal heart sounds.  Pulmonary:     Effort: Pulmonary effort is normal.     Breath sounds: Normal breath sounds.  Abdominal:      General: Abdomen is flat.     Palpations: Abdomen is soft.  Musculoskeletal:        General: No swelling.  Skin:    General: Skin is warm and dry.  Neurological:     General: No focal deficit present.     Mental Status: He is alert.  Psychiatric:        Mood and Affect: Mood normal.        Thought Content: Thought content normal.     Labs reviewed: Recent Labs    08/03/18 2036 09/15/18 10/05/18 0922 01/22/19  NA 134* 140 138 141  K 3.6 4.2 3.8 4.0  CL 106 104 103  --   CO2 20* 25 24  --  GLUCOSE 159*  --  122*  --   BUN 18 15 12 15   CREATININE 1.07 0.7 0.71 0.7  CALCIUM 9.0 8.8 8.9  --    Recent Labs    06/10/18 01/22/19  AST 12* 11*  ALT 18 16  ALKPHOS 89  --   PROT 6.3  --   ALBUMIN 4.1  --    Recent Labs    08/03/18 2036 08/05/18 0427 09/15/18 10/05/18 0922  WBC 18.0* 10.2 6.6 6.5  NEUTROABS  --   --   --  4.1  HGB 14.1 12.8* 12.8* 13.3  HCT 46.6 40.7 39* 43.5  MCV 92.6 91.9  --  93.1  PLT 275 215 196 217   Lab Results  Component Value Date   TSH 1.70 12/03/2017   Lab Results  Component Value Date   HGBA1C 6.1 01/22/2019   Lab Results  Component Value Date   CHOL 95 08/04/2018   HDL 31 (L) 08/04/2018   LDLCALC 47 08/04/2018   TRIG 84 08/04/2018   CHOLHDL 3.1 08/04/2018    Significant Diagnostic Results in last 30 days:   Assessment/Plan S/P Hematoma after Fall Will continue neuro check does not have any ocular symptoms right now  Parkinson's disease with ? PSPwith Unsteady Gait and Recurrent Falls No dizziness this time mostly unsteady gait and falls Therapy had evaluated him and discharged him Reinforced patient to call for help Continue Requip and Sinemet  Urinary frequency Looks like symptoms of BPH We will check UA I do not feel comfortable at this time to start him on Flomax due to his risk of falls.  Other issues are stable  CAD Was seen by Cardiologist in 11/19 when he was admitted with Syncope. Work up was negative  Echo was negative for any acute process On Beta blocker Essential hypertension With these Falls and Bradycardia I had decreased his Metoprolol to 75 mg His BP seems controlled and no more Bradycardia Continue to check his BP Loose control of BP  Hypercholesterolemia Last LDL was 47 in 11/19   Cognitive impairment On Aricept  Family/ staff Communication:   Labs/tests ordered:  UA  Total time spent in this patient care encounter was  25_  minutes; greater than 50% of the visit spent counseling patient and staff, reviewing records , Labs and coordinating care for problems addressed at this encounter.

## 2019-03-14 DIAGNOSIS — T148XXA Other injury of unspecified body region, initial encounter: Secondary | ICD-10-CM | POA: Insufficient documentation

## 2019-03-25 ENCOUNTER — Non-Acute Institutional Stay: Payer: BC Managed Care – PPO | Admitting: Nurse Practitioner

## 2019-03-25 ENCOUNTER — Encounter: Payer: Self-pay | Admitting: Nurse Practitioner

## 2019-03-25 DIAGNOSIS — I10 Essential (primary) hypertension: Secondary | ICD-10-CM | POA: Diagnosis not present

## 2019-03-25 DIAGNOSIS — G231 Progressive supranuclear ophthalmoplegia [Steele-Richardson-Olszewski]: Secondary | ICD-10-CM

## 2019-03-25 DIAGNOSIS — R4189 Other symptoms and signs involving cognitive functions and awareness: Secondary | ICD-10-CM | POA: Diagnosis not present

## 2019-03-25 DIAGNOSIS — R634 Abnormal weight loss: Secondary | ICD-10-CM | POA: Insufficient documentation

## 2019-03-25 DIAGNOSIS — R2681 Unsteadiness on feet: Secondary | ICD-10-CM

## 2019-03-25 NOTE — Assessment & Plan Note (Addendum)
Continue SNF [facility] for safety and care assistance, frequent cuing and reminders for safety, continue Donepezil 10mg  qd for memory.

## 2019-03-25 NOTE — Assessment & Plan Note (Signed)
Persists, continue SNF [facility] for safety and care assistance, encourage w/c for mobility, asking for assistance with transferring.

## 2019-03-25 NOTE — Progress Notes (Signed)
Location:   SNF [place] Room Number: 4/B Place of Service:  SNF (31) Provider: [facility] Tahja Liao NP  [name], MD  Patient Care Team: [name], MD as PCP - General (Internal Medicine) Genell Thede X, NP as Nurse Practitioner (Internal Medicine)  Extended Emergency Contact Information Primary Emergency Contact: [place],Frank Address: [place], [place] [address] [place] Phone: [phone] Mobile Phone: [phone] Relation: Brother  Code Status:  DNR Goals of care: Advanced Directive information Advanced Directives 03/25/2019  Does Patient Have a Medical Advance Directive? Yes  Type of Advance Directive [place]  Does patient want to make changes to medical advance directive? No - Patient declined  Copy of [place] in Chart? Yes - validated most recent copy scanned in chart (See row information)  Would patient like information on creating a medical advance directive? No - Patient declined  Pre-existing out of facility DNR order (yellow form or pink MOST form) Yellow form placed in chart (order not valid for inpatient use)     Chief Complaint  Patient presents with  . Medical Management of Chronic Issues    Routine visit, blood sugar 11/19/2018 139    HPI:  Pt is a 63 y.o. male seen today for medical management of chronic diseases.    The patient resides in SNF [facility] for safety and care assistance, on Donepezil 45m qd for memory,  frequent falling, residual bruise right peri orbital area. Hx of Progressive supranuclear palsy, unsteady gait, ib Requip 0.218mqd, Sinemet 25/10031m1/2 tab qid. HTN, blood pressure is controlled on Metoprolol 41m41m, Lisinopril 10mg56mast Medical History:  Diagnosis Date  . Adult onset fluency disorder   . Coronary artery disease    a. s/p stent left anterior descending artery remotely (per 2017 note, "about 4 years" prior). b. Normal nuc 05/2015.  .  Diabetes mellitus type 2, controlled (HCC) [place]03/2017  . Dysphagia 2018  . Gait instability 2018  . History of falling   . Hypercholesterolemia   . Hypertension   . Memory change 2018  . Parkinson's disease (HCC) [place]  . Weight loss 2017   intentional   Past Surgical History:  Procedure Laterality Date  . CAROTID STENT  2012   in left anterior descending artery past EF 55%  . nuclear stress test  06/17/2015   Gated SPECT images reveal normal LV systolic function. EF 55%. Comparing the rest & stress SPECT images, no ischemia. Gated images are normal. Dr. WaheeThedora HindersGoldsClover Mealy  [place]  Allergen Reactions  . Codeine     Unknown     Allergies as of 03/25/2019      Reactions   Codeine    Unknown       Medication List       Accurate as of March 25, 2019  2:33 PM. If you have any questions, ask your nurse or doctor.        aspirin 81 MG chewable tablet Chew 81 mg by mouth daily.   atorvastatin 40 MG tablet Commonly known as: LIPITOR Take 40 mg by mouth at bedtime. \   carbidopa-levodopa 25-100 MG tablet Commonly known as: SINEMET IR Take 1.5 tablets by mouth 4 (four) times daily. Take 1 and 1/2 tablets four times a day   donepezil 10 MG tablet Commonly known as: ARICEPT Take 10 mg by mouth  at bedtime.   lisinopril 10 MG tablet Commonly known as: ZESTRIL Take 10 mg by mouth daily.   Metoprolol Succinate 50 MG Cs24 Take 75 mg by mouth daily. Take 1 1/2 tabs once a day   rOPINIRole 0.25 MG tablet Commonly known as: REQUIP Take 0.25 mg by mouth at bedtime.   sennosides-docusate sodium 8.6-50 MG tablet Commonly known as: SENOKOT-S Take 1 tablet by mouth every other day.      ROS was provided with assistance of staff.  Review of Systems  Constitutional: Positive for unexpected weight change. Negative for activity change and appetite change.       #8Ibs weight loss in the past month.   HENT: Positive for hearing loss. Negative for congestion and  voice change.   Eyes: Negative for visual disturbance.  Respiratory: Negative for cough, shortness of breath and wheezing.   Gastrointestinal: Negative for abdominal distention, constipation, diarrhea, nausea and vomiting.  Genitourinary: Negative for difficulty urinating, dysuria and urgency.  Musculoskeletal: Positive for gait problem.  Skin: Positive for color change.       Residual of the right peri orbital bruise.   Neurological: Negative for dizziness, speech difficulty, weakness and headaches.       Memory lapses.   Psychiatric/Behavioral: Negative for agitation, behavioral problems, hallucinations and sleep disturbance. The patient is not nervous/anxious.     Immunization History  Administered Date(s) Administered  . Influenza Whole 06/20/2018  . Influenza-Unspecified 07/08/2017  . PPD Test 01/15/2017  . Pneumococcal Polysaccharide-23 08/05/2018  . Td 09/17/2016   Pertinent  Health Maintenance Due  Topic Date Due  . COLONOSCOPY  06/03/2004  . OPHTHALMOLOGY EXAM  10/08/2017  . FOOT EXAM  01/21/2018  . INFLUENZA VACCINE  04/18/2019  . HEMOGLOBIN A1C  07/25/2019   Fall Risk  01/21/2017  Falls in the past year? Yes  Number falls in past yr: 2 or more  Injury with Fall? Yes  Risk Factor Category  High Fall Risk  Risk for fall due to : History of fall(s);Impaired balance/gait;Impaired mobility  Follow up Falls evaluation completed   Functional Status Survey:    Vitals:   03/25/19 0848  BP: 130/82  Pulse: 76  Resp: 18  Temp: 97.8 F (36.6 C)  SpO2: 96%  Weight: 232 lb 9.6 oz (105.5 kg)  Height: _0  (1.803 m)   Body mass index is 32.44 kg/m. Physical Exam Constitutional:      General: He is not in acute distress.    Appearance: Normal appearance. He is obese. He is not ill-appearing, toxic-appearing or diaphoretic.  HENT:     Head: Normocephalic and atraumatic.     Nose: Nose normal.     Mouth/Throat:     Mouth: Mucous membranes are moist.  Eyes:      Extraocular Movements: Extraocular movements intact.     Conjunctiva/sclera: Conjunctivae normal.     Pupils: Pupils are equal, round, and reactive to light.     Comments: Difficulty with eye movements.   Neck:     Musculoskeletal: Normal range of motion and neck supple.  Cardiovascular:     Rate and Rhythm: Normal rate and regular rhythm.     Heart sounds: No murmur.  Pulmonary:     Effort: Pulmonary effort is normal.     Breath sounds: No wheezing, rhonchi or rales.  Abdominal:     General: Bowel sounds are normal. There is no distension.     Palpations: Abdomen is soft.     Tenderness: There  is no abdominal tenderness. There is no left CVA tenderness, guarding or rebound.  Musculoskeletal:     Right lower leg: No edema.     Left lower leg: No edema.     Comments: Unsteady gait, w/c for mobility.   Skin:    General: Skin is warm and dry.     Findings: Bruising present.     Comments: Residual of the right peri orbital bruise.   Neurological:     General: No focal deficit present.     Mental Status: He is alert. Mental status is at baseline.     Cranial Nerves: No cranial nerve deficit.     Motor: No weakness.     Coordination: Coordination abnormal.     Gait: Gait abnormal.     Comments: Oriented to person and place. Mask facial looking. Rigidity of limbs.   Psychiatric:        Mood and Affect: Mood normal.        Behavior: Behavior normal.     Labs reviewed: Recent Labs    08/03/18 2036 09/15/18 10/05/18 0922 01/22/19  NA 134* 140 138 141  K 3.6 4.2 3.8 4.0  CL 106 104 103  --   CO2 20* 25 24  --   GLUCOSE 159*  --  122*  --   BUN _0 CREATININE 1.07 0.7 0.71 0.7  CALCIUM 9.0 8.8 8.9  --    Recent Labs    06/10/18 01/22/19  AST 12* 11*  ALT 18 16  ALKPHOS 89  --   PROT 6.3  --   ALBUMIN 4.1  --    Recent Labs    08/03/18 2036 08/05/18 0427 09/15/18 10/05/18 0922  WBC 18.0* 10.2 6.6 6.5  NEUTROABS  --   --   --  4.1  HGB 14.1 12.8* 12.8*  13.3  HCT 46.6 40.7 39* 43.5  MCV 92.6 91.9  --  93.1  PLT 275 215 196 217   Lab Results  Component Value Date   TSH 1.70 12/03/2017   Lab Results  Component Value Date   HGBA1C 6.1 01/22/2019   Lab Results  Component Value Date   CHOL 95 08/04/2018   HDL 31 (L) 08/04/2018   LDLCALC 47 08/04/2018   TRIG 84 08/04/2018   CHOLHDL 3.1 08/04/2018    Significant Diagnostic Results in last 30 days:  No results found.  Assessment/Plan  Hypertension Blood pressure is controlled, continue Metoprolol 50m qd, Lisinopril 130mqd.   Progressive supranuclear palsy (HCC) Mask facial looks, frequent falling, unsteady gait, continue Sinemet 25/10051m1/2 tab qid, Requip 0.15m22m.   Cognitive impairment Continue SNF [facility] for safety and care assistance, frequent cuing and reminders for safety, continue Donepezil 10mg56mfor memory.   Unsteady gait Persists, continue SNF [facility] for safety and care assistance, encourage w/c for mobility, asking for assistance with transferring.   Weight loss About #8Ibs in the past month in the absence of GI symptoms, will f/u dietitian, desirable. Update CBC/diff, CMP/eGFR, TSH   Family/ staff Communication: plan of care reviewed with the patient and charge nurse.   Labs/tests ordered:  CBC/diff, CMP/eGFR, TSH  Time spend 25 minutes.

## 2019-03-25 NOTE — Assessment & Plan Note (Signed)
Mask facial looks, frequent falling, unsteady gait, continue Sinemet 25/100mg  11/2 tab qid, Requip 0.25mg  qd.

## 2019-03-25 NOTE — Assessment & Plan Note (Addendum)
About #8Ibs in the past month in the absence of GI symptoms, will f/u dietitian, desirable. Update CBC/diff, CMP/eGFR, TSH

## 2019-03-25 NOTE — Assessment & Plan Note (Signed)
Blood pressure is controlled, continue Metoprolol 75mg qd, Lisinopril 10mg qd.  

## 2019-03-26 ENCOUNTER — Other Ambulatory Visit: Payer: Self-pay

## 2019-03-26 ENCOUNTER — Emergency Department (HOSPITAL_COMMUNITY)
Admission: EM | Admit: 2019-03-26 | Discharge: 2019-03-26 | Disposition: A | Discharge status: Home / Self Care | Payer: BC Managed Care – PPO | Attending: Emergency Medicine | Admitting: Emergency Medicine

## 2019-03-26 ENCOUNTER — Encounter (HOSPITAL_COMMUNITY): Payer: Self-pay

## 2019-03-26 ENCOUNTER — Emergency Department (HOSPITAL_COMMUNITY): Payer: BC Managed Care – PPO

## 2019-03-26 DIAGNOSIS — Y999 Unspecified external cause status: Secondary | ICD-10-CM | POA: Insufficient documentation

## 2019-03-26 DIAGNOSIS — I2510 Atherosclerotic heart disease of native coronary artery without angina pectoris: Secondary | ICD-10-CM | POA: Insufficient documentation

## 2019-03-26 DIAGNOSIS — I252 Old myocardial infarction: Secondary | ICD-10-CM | POA: Insufficient documentation

## 2019-03-26 DIAGNOSIS — W010XXA Fall on same level from slipping, tripping and stumbling without subsequent striking against object, initial encounter: Secondary | ICD-10-CM | POA: Diagnosis not present

## 2019-03-26 DIAGNOSIS — Y9301 Activity, walking, marching and hiking: Secondary | ICD-10-CM | POA: Insufficient documentation

## 2019-03-26 DIAGNOSIS — Y92122 Bedroom in nursing home as the place of occurrence of the external cause: Secondary | ICD-10-CM | POA: Insufficient documentation

## 2019-03-26 DIAGNOSIS — I10 Essential (primary) hypertension: Secondary | ICD-10-CM | POA: Insufficient documentation

## 2019-03-26 DIAGNOSIS — W19XXXA Unspecified fall, initial encounter: Secondary | ICD-10-CM

## 2019-03-26 DIAGNOSIS — E119 Type 2 diabetes mellitus without complications: Secondary | ICD-10-CM | POA: Diagnosis not present

## 2019-03-26 DIAGNOSIS — Z7982 Long term (current) use of aspirin: Secondary | ICD-10-CM | POA: Diagnosis not present

## 2019-03-26 DIAGNOSIS — G20 Parkinson's disease: Secondary | ICD-10-CM | POA: Diagnosis not present

## 2019-03-26 DIAGNOSIS — Z79899 Other long term (current) drug therapy: Secondary | ICD-10-CM | POA: Insufficient documentation

## 2019-03-26 DIAGNOSIS — S0101XA Laceration without foreign body of scalp, initial encounter: Secondary | ICD-10-CM | POA: Insufficient documentation

## 2019-03-26 DIAGNOSIS — S0990XA Unspecified injury of head, initial encounter: Secondary | ICD-10-CM | POA: Diagnosis present

## 2019-03-26 MED ORDER — LIDOCAINE-EPINEPHRINE 2 %-1:100000 IJ SOLN
10.0000 mL | Freq: Once | INTRAMUSCULAR | Status: DC
  Filled 2019-03-26: qty 1

## 2019-03-26 NOTE — ED Notes (Signed)
Bed: WA04 Expected date:  Expected time:  Means of arrival:  Comments: 

## 2019-03-26 NOTE — ED Notes (Signed)
Staff at [facility] called for his return

## 2019-03-26 NOTE — ED Notes (Signed)
[facility] called for transportation back to [facility]

## 2019-03-26 NOTE — ED Notes (Signed)
Pt states that he doesn't remember how he fell but states he was in the bathroom and doesn't know what he hit his head on, he has about a 2 inch laceration to the base of his head, bleeding controlled at present

## 2019-03-26 NOTE — ED Provider Notes (Signed)
[facility]  CSN: [id number] Arrival date & time: 03/26/19 0300  Chief Complaint(s) No chief complaint on file.  HPI Alan Wells is a 63 y.o. male with a history of Parkinson's disease and gait instability who lives in a facility presents to the emergency department after an unwitnessed fall.  Earlier this morning patient reports that he woke up and got up to go to the bathroom.  States that he lost his balance causing him to fall backwards and hitting his head.  He is unsure whether he lost consciousness.  He is endorsing occipital head/scalp pain that is exacerbated with palpation.  He denies any neck pain, back pain, extremity pain, chest pain, shortness of breath, abdominal pain.  He denies any other physical complaints or other injuries.  HPI  Past Medical History Past Medical History:  Diagnosis Date  . Adult onset fluency disorder   . Coronary artery disease    a. s/p stent left anterior descending artery remotely (per 2017 note, "about 4 years" prior). b. Normal nuc 05/2015.  . Diabetes mellitus type 2, controlled (HCC) 01/18/2016  . Dysphagia 2018  . Gait instability 2018  . History of falling   . Hypercholesterolemia   . Hypertension   . Memory change 2018  . Parkinson's disease (HCC) 2018  . Weight loss 2017   intentional   Patient Active Problem List   Diagnosis Date Noted  . Weight loss 03/25/2019  . Hematoma 03/14/2019  . Recurrent falls 01/20/2019  . Dizziness 10/06/2018  . Leukocytosis 08/03/2018  . Constipation 06/03/2017  . Fall 06/03/2017  . Obesity (BMI 30-39.9) 05/10/2017  . Insomnia 03/19/2017  . NSTEMI (non-ST elevated myocardial infarction) (HCC)   . Hypertension   . Hypercholesterolemia   . 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 Cognitive impairment 09/17/2016  . Unsteady gait 09/17/2016  . Dysphagia 09/17/2016  . Diabetes mellitus type 2, controlled (HCC) 01/18/2016  . Coronary  artery disease 09/17/2014   Home Medication(s) Prior to Admission medications   Medication Sig Start Date End Date Taking? Authorizing Provider  aspirin 81 MG chewable tablet Chew 81 mg by mouth daily.     [provider]  atorvastatin (LIPITOR) 40 MG tablet Take 40 mg by mouth at bedtime. \    [provider]  carbidopa-levodopa (SINEMET IR) 25-100 MG tablet Take 1.5 tablets by mouth 4 (four) times daily. Take 1 and 1/2 tablets four times a day    [provider]  donepezil (ARICEPT) 10 MG tablet Take 10 mg by mouth at bedtime.    [provider]  lisinopril (PRINIVIL,ZESTRIL) 10 MG tablet Take 10 mg by mouth daily.     [provider]  Metoprolol Succinate 50 MG CS24 Take 75 mg by mouth daily. Take 1 1/2 tabs once a day    [provider]  rOPINIRole (REQUIP) 0.25 MG tablet Take 0.25 mg by mouth at bedtime.     [provider]  sennosides-docusate sodium (SENOKOT-S) 8.6-50 MG tablet Take 1 tablet by mouth every other day.     [provider]  Past Surgical History Past Surgical History:  Procedure Laterality Date  . CAROTID STENT  2012   in left anterior descending artery past EF 55%  . nuclear stress test  06/17/2015   Gated SPECT images reveal normal LV systolic function. EF 55%. Comparing the rest & stress SPECT images, no ischemia. Gated images are normal. Dr. Vernell BarrierWaheed Akhtar, [place]    Family History Family History  Problem Relation Age of Onset  . Cancer Mother   . Heart disease Mother     Social History Social History   Tobacco Use  . Smoking status: Never Smoker  . Smokeless tobacco: Never Used  Substance Use Topics  . Alcohol use: No  . Drug use: No   Allergies Codeine  Review of Systems Review of Systems All other systems are reviewed and are negative for  acute change except as noted in the HPI  Physical Exam Vital Signs  I have reviewed the triage vital signs BP 134/62   Pulse (!) 54   Temp 98.1 F (36.7 C) (Oral)   Resp 18   SpO2 95%   Physical Exam Constitutional:      General: He is not in acute distress.    Appearance: He is well-developed. He is not diaphoretic.  HENT:     Head: Normocephalic.      Right Ear: External ear normal.     Left Ear: External ear normal.  Eyes:     General: No scleral icterus.       Right eye: No discharge.        Left eye: No discharge.     Conjunctiva/sclera: Conjunctivae normal.     Pupils: Pupils are equal, round, and reactive to light.  Neck:     Musculoskeletal: Normal range of motion and neck supple.  Cardiovascular:     Rate and Rhythm: Regular rhythm.     Pulses:          Radial pulses are 2+ on the right side and 2+ on the left side.       Dorsalis pedis pulses are 2+ on the right side and 2+ on the left side.     Heart sounds: Normal heart sounds. No murmur. No friction rub. No gallop.   Pulmonary:     Effort: Pulmonary effort is normal. No respiratory distress.     Breath sounds: Normal breath sounds. No stridor.  Abdominal:     General: There is no distension.     Palpations: Abdomen is soft.     Tenderness: There is no abdominal tenderness.  Musculoskeletal:     Cervical back: He exhibits no bony tenderness.     Thoracic back: He exhibits no bony tenderness.     Lumbar back: He exhibits no bony tenderness.     Comments: Clavicle stable. Chest stable to AP/Lat compression. Pelvis stable to Lat compression. No obvious extremity deformity. No chest or abdominal wall contusion.  Skin:    General: Skin is warm.  Neurological:     Mental Status: He is alert and oriented to person, place, and time.     GCS: GCS eye subscore is 4. GCS verbal subscore is 5. GCS motor subscore is 6.     Comments: Moving all extremities      ED Results and Treatments Labs (all labs  ordered are listed, but only abnormal results are displayed) Labs Reviewed - No data to display  EKG  EKG Interpretation  Date/Time:    Ventricular Rate:    PR Interval:    QRS Duration:   QT Interval:    QTC Calculation:   R Axis:     Text Interpretation:        Radiology Ct Head Wo Contrast  Result Date: 03/26/2019 CLINICAL DATA:  Head trauma. Intracranial venous injury suspected. Fall getting up to go to the bathroom. Occipital scalp laceration. The patient is amnestic to the event but denies loss of consciousness. Initial encounter. EXAM: CT HEAD WITHOUT CONTRAST TECHNIQUE: Contiguous axial images were obtained from the base of the skull through the vertex without intravenous contrast. COMPARISON:  CT head without contrast 10/04/2018 FINDINGS: Brain: No acute infarct, hemorrhage, or mass lesion is present. Minimal white matter changes are within normal limits for age. The ventricles are of normal size. No significant extraaxial fluid collection is present. The brainstem and cerebellum are within normal limits. Vascular: Atherosclerotic calcifications are again noted within the cavernous internal carotid arteries. There is no hyperdense vessel. Skull: Left occipital scalp laceration and hematoma is present. There is no underlying fracture. Sinuses/Orbits: The paranasal sinuses and mastoid air cells are clear. The globes and orbits are within normal limits. Other: IMPRESSION: 1. Stable normal CT appearance of the brain for age. No acute intracranial abnormality. 2. Left occipital scalp laceration and hematoma without underlying fracture. Electronically Signed   By: San Morelle M.D.   On: 03/26/2019 04:31    Pertinent labs & imaging results that were available during my care of the patient were reviewed by me and considered in my medical decision making (see chart for  details).  Medications Ordered in ED Medications  lidocaine-EPINEPHrine (XYLOCAINE W/EPI) 2 %-1:100000 (with pres) injection 10 mL (has no administration in time range)                                                                                                                                    Procedures .Marland KitchenLaceration Repair  Date/Time: 03/26/2019 5:49 AM Performed by: Fatima Blank, MD Authorized by: Fatima Blank, MD   Consent:    Consent obtained:  Verbal   Consent given by:  Patient   Risks discussed:  Need for additional repair, poor cosmetic result and poor wound healing   Alternatives discussed:  Delayed treatment Anesthesia (see MAR for exact dosages):    Anesthesia method:  Local infiltration   Local anesthetic:  Lidocaine 2% WITH epi Laceration details:    Location:  Scalp   Scalp location:  Occipital   Length (cm):  3.5   Depth (mm):  10 Repair type:    Repair type:  Simple Pre-procedure details:    Preparation:  Patient was prepped and draped in usual sterile fashion and imaging obtained to evaluate for foreign bodies Exploration:    Wound extent: no foreign bodies/material noted and no muscle damage noted     Contaminated:  no   Treatment:    Amount of cleaning:  Standard   Irrigation solution:  Sterile saline   Irrigation volume:  500   Irrigation method:  Pressure wash   Visualized foreign bodies/material removed: no   Skin repair:    Repair method:  Staples   Number of staples:  3 Approximation:    Approximation:  Close Post-procedure details:    Patient tolerance of procedure:  Tolerated well, no immediate complications    (including critical care time)  Medical Decision Making / ED Course I have reviewed the nursing notes for this encounter and the patient's prior records (if available in EHR or on provided paperwork).   Alan Wells was evaluated in Emergency Department on 03/26/2019 for the symptoms described in the history  of present illness. He was evaluated in the context of the global COVID-19 pandemic, which necessitated consideration that the patient might be at risk for infection with the SARS-CoV-2 virus that causes COVID-19. Institutional protocols and algorithms that pertain to the evaluation of patients at risk for COVID-19 are in a state of rapid change based on information released by regulatory bodies including the CDC and federal and state organizations. These policies and algorithms were followed during the patient's care in the ED.   Fall likely related to patient's gait instability resulting in head trauma and occipital laceration.  Upon review of records, tetanus is updated.  CT head negative for ICH or underlying fracture.  No foreign bodies noted.  Wound thoroughly irrigated and closed as above.  No other injuries noted on exam requiring work-up.  The patient appears reasonably screened and/or stabilized for discharge and I doubt any other medical condition or other [facility] requiring further screening, evaluation, or treatment in the ED at this time prior to discharge.  The patient is safe for discharge with strict return precautions.      Final Clinical Impression(s) / ED Diagnoses Final diagnoses:  Fall, initial encounter  Laceration of occipital scalp, initial encounter     The patient appears reasonably screened and/or stabilized for discharge and I doubt any other medical condition or other [facility] requiring further screening, evaluation, or treatment in the ED at this time prior to discharge.  Disposition: Discharge  Condition: Good  I have discussed the results, Dx and Tx plan with the patient who expressed understanding and agree(s) with the plan. Discharge instructions discussed at great length. The patient was given strict return precautions who verbalized understanding of the instructions. No further questions at time of discharge.    ED Discharge Orders    None       Follow  Up: Mahlon GammonGupta, Anjali L, MD [address] [place] [id number] [phone]   in 5-7 days, For staple removal    This chart was dictated using voice recognition software.  Despite best efforts to proofread,  errors can occur which can change the documentation meaning.   Nira Connardama, Delvina Mizzell Eduardo, MD 03/26/19 [phone]

## 2019-03-26 NOTE — ED Triage Notes (Signed)
Pt is from [facility], he can't remember how he fell but didn't lose consciousness, he states he was getting up to go to the bathroom when he fell, he has a laceration to the back of his head, the nurse found the patient and called EMS

## 2019-03-27 LAB — TSH: TSH: 2.51 (ref 0.41–5.90)

## 2019-03-27 LAB — CBC AND DIFFERENTIAL
HCT: 40 — AB (ref 41–53)
Hemoglobin: 13 — AB (ref 13.5–17.5)
Platelets: 243 (ref 150–399)

## 2019-03-27 LAB — HEPATIC FUNCTION PANEL
ALT: 15 (ref 10–40)
AST: 10 — AB (ref 14–40)

## 2019-03-27 LAB — BASIC METABOLIC PANEL
BUN: 12 (ref 4–21)
Creatinine: 0.8 (ref 0.6–1.3)
Glucose: 107
Potassium: 4.1 (ref 3.4–5.3)
Sodium: 141 (ref 137–147)

## 2019-03-30 ENCOUNTER — Non-Acute Institutional Stay: Payer: BC Managed Care – PPO | Admitting: Nurse Practitioner

## 2019-03-30 ENCOUNTER — Encounter: Payer: Self-pay | Admitting: Nurse Practitioner

## 2019-03-30 DIAGNOSIS — I10 Essential (primary) hypertension: Secondary | ICD-10-CM

## 2019-03-30 DIAGNOSIS — S0101XD Laceration without foreign body of scalp, subsequent encounter: Secondary | ICD-10-CM | POA: Diagnosis not present

## 2019-03-30 DIAGNOSIS — R2681 Unsteadiness on feet: Secondary | ICD-10-CM

## 2019-03-30 DIAGNOSIS — G231 Progressive supranuclear ophthalmoplegia [Steele-Richardson-Olszewski]: Secondary | ICD-10-CM

## 2019-03-30 DIAGNOSIS — R4189 Other symptoms and signs involving cognitive functions and awareness: Secondary | ICD-10-CM

## 2019-03-30 DIAGNOSIS — R296 Repeated falls: Secondary | ICD-10-CM

## 2019-03-30 NOTE — Assessment & Plan Note (Addendum)
Lack of safety awareness increased gait abnormality related to progressive supranuclear palsy/parkinson's  are contributory. Close supervision for safety. W/c for mobility.

## 2019-03-30 NOTE — Assessment & Plan Note (Signed)
Lack of safety awareness and abnormal gait are contributory to his recurrent falling. Close supervision, f/u neurology ASAP

## 2019-03-30 NOTE — Assessment & Plan Note (Signed)
Blood pressure is controlled, continue Metoprolol 75mg  qd, Lisinopril 10mg  qd.

## 2019-03-30 NOTE — Assessment & Plan Note (Signed)
F/u Neurology ASAP, continue Sinemet 25/100mg  1.5 tab qid, Requip 0.25mg  qd.

## 2019-03-30 NOTE — Assessment & Plan Note (Signed)
Continue SNF [facility] for safety and care assistance, continue Donepezil 10mg qd.  

## 2019-03-30 NOTE — Progress Notes (Signed)
Location:    [facility]  Nursing Home Room Number: 4/B Place of Service:  SNF (31)[facility] Provider: Arna SnipeManXie Nyquan Selbe NP  Mahlon GammonGupta, Anjali L, MD  Patient Care Team: Mahlon GammonGupta, Anjali L, MD as PCP - General (Internal Medicine) Kaleiah Kutzer X, NP as Nurse Practitioner (Internal Medicine)  Extended Emergency Contact Information Primary Emergency Contact: Kopecky,Frank Address: [address]          [place], [place] [address] [place] Home Phone: [phone] Mobile Phone: [phone] Relation: Brother  Code Status: DNR Goals of care: Advanced Directive information Advanced Directives 03/30/2019  Does Patient Have a Medical Advance Directive? Yes  Type of Advance Directive Out of facility DNR (pink MOST or yellow form)  Does patient want to make changes to medical advance directive? No - Patient declined  Copy of Healthcare Power of Attorney in Chart? Yes - validated most recent copy scanned in chart (See row information)  Would patient like information on creating a medical advance directive? No - Patient declined  Pre-existing out of facility DNR order (yellow form or pink MOST form) Yellow form placed in chart (order not valid for inpatient use)     Chief Complaint  Patient presents with  . Acute Visit    ED follow up,blood sugar 139    HPI:  Pt is a 63 y.o. male seen today for an acute visit for f/u ED evaluation 03/26/19 for occipital scalp laceration. Intact staple closures x3 with no s/s of wound infection. CT head showed no acute process in ED. Hx of progressive supranuclear palsy/parkinson's, frequent falling, on Sinemet 25/100mg  1.5 tab qid, Requip 0.25mg  qd. Dementia, resides in SN[facility], on Donepezil 10mg  qd. HTN, blood pressure is controlled on Metoprolol 75mg  qd, Lisinopril 10mg  qd.    Past Medical History:  Diagnosis Date  . Adult onset fluency disorder   . Coronary artery disease    a. s/p stent left anterior descending artery remotely (per 2017 note, "about 4 years"  prior). b. Normal nuc 05/2015.  . Diabetes mellitus type 2, controlled (HCC) 01/18/2016  . Dysphagia 2018  . Gait instability 2018  . History of falling   . Hypercholesterolemia   . Hypertension   . Memory change 2018  . Parkinson's disease (HCC) 2018  . Weight loss 2017   intentional   Past Surgical History:  Procedure Laterality Date  . CAROTID STENT  2012   in left anterior descending artery past EF 55%  . nuclear stress test  06/17/2015   Gated SPECT images reveal normal LV systolic function. EF 55%. Comparing the rest & stress SPECT images, no ischemia. Gated images are normal. Dr. Vernell BarrierWaheed Akhtar, MD [place]     Allergies  Allergen Reactions  . Codeine     Unknown     Allergies as of 03/30/2019      Reactions   Codeine    Unknown       Medication List       Accurate as of March 30, 2019  2:50 PM. If you have any questions, ask your nurse or doctor.        aspirin 81 MG chewable tablet Chew 81 mg by mouth daily.   atorvastatin 40 MG tablet Commonly known as: LIPITOR Take 40 mg by mouth at bedtime. \   carbidopa-levodopa 25-100 MG tablet Commonly known as: SINEMET IR Take 1.5 tablets by mouth 4 (four) times daily. Take 1 and 1/2 tablets four times a day   donepezil 10 MG  tablet Commonly known as: ARICEPT Take 10 mg by mouth at bedtime.   lisinopril 10 MG tablet Commonly known as: ZESTRIL Take 10 mg by mouth daily.   Metoprolol Succinate 50 MG Cs24 Take 75 mg by mouth daily. Take 1 1/2 tabs once a day   rOPINIRole 0.25 MG tablet Commonly known as: REQUIP Take 0.25 mg by mouth at bedtime.   sennosides-docusate sodium 8.6-50 MG tablet Commonly known as: SENOKOT-S Take 1 tablet by mouth every other day.      ROS was provided with assistance of staff Review of Systems  Constitutional: Negative for activity change, appetite change, chills, diaphoresis, fatigue and fever.  HENT: Negative for congestion and voice change.   Eyes: Negative for  visual disturbance.  Respiratory: Negative for cough, shortness of breath and wheezing.   Gastrointestinal: Negative for abdominal distention, abdominal pain, constipation, diarrhea, nausea and vomiting.  Genitourinary: Negative for difficulty urinating, dysuria and urgency.  Musculoskeletal: Positive for gait problem.  Skin: Positive for wound.       Occipital scalp laceration is approximated with staple closures x3, no s/s of infection.   Neurological: Negative for dizziness, speech difficulty, weakness and headaches.       Dementia  Psychiatric/Behavioral: Negative for agitation, behavioral problems, hallucinations and suicidal ideas. The patient is not nervous/anxious.     Immunization History  Administered Date(s) Administered  . Influenza Whole 06/20/2018  . Influenza-Unspecified 07/08/2017  . PPD Test 01/15/2017  . Pneumococcal Polysaccharide-23 08/05/2018  . Td 09/17/2016   Pertinent  Health Maintenance Due  Topic Date Due  . COLONOSCOPY  06/03/2004  . OPHTHALMOLOGY EXAM  10/08/2017  . FOOT EXAM  01/21/2018  . INFLUENZA VACCINE  04/18/2019  . HEMOGLOBIN A1C  07/25/2019   Fall Risk  01/21/2017  Falls in the past year? Yes  Number falls in past yr: 2 or more  Injury with Fall? Yes  Risk Factor Category  High Fall Risk  Risk for fall due to : History of fall(s);Impaired balance/gait;Impaired mobility  Follow up Falls evaluation completed   Functional Status Survey:    Vitals:   03/30/19 1438  BP: (!) 112/48  Pulse: (!) 56  Resp: 18  Temp: 97.8 F (36.6 C)  SpO2: 95%  Weight: 232 lb 9.6 oz (105.5 kg)  Height: 5\' 11"  (1.803 m)   Body mass index is 32.44 kg/m. Physical Exam Vitals signs and nursing note reviewed.  Constitutional:      General: He is not in acute distress.    Appearance: He is not ill-appearing, toxic-appearing or diaphoretic.  HENT:     Head: Normocephalic and atraumatic.     Nose: Nose normal.     Mouth/Throat:     Mouth: Mucous membranes  are moist.  Eyes:     Conjunctiva/sclera: Conjunctivae normal.     Pupils: Pupils are equal, round, and reactive to light.     Comments: Difficulty eye movement  Neck:     Musculoskeletal: Normal range of motion and neck supple.  Cardiovascular:     Rate and Rhythm: Normal rate and regular rhythm.     Heart sounds: No murmur.  Pulmonary:     Effort: Pulmonary effort is normal.     Breath sounds: No wheezing, rhonchi or rales.  Chest:     Chest wall: No tenderness.  Abdominal:     General: Bowel sounds are normal.     Palpations: Abdomen is soft.     Tenderness: There is no abdominal tenderness. There is  no right CVA tenderness, left CVA tenderness, guarding or rebound.  Musculoskeletal:     Right lower leg: No edema.     Left lower leg: No edema.     Comments: W/c for mobility.   Skin:    General: Skin is warm and dry.     Findings: Bruising present.     Comments: Occipital scalp laceration is approximated with staple closures x3, no s/s of infection.    Neurological:     General: No focal deficit present.     Mental Status: He is alert. Mental status is at baseline.     Motor: No weakness.     Coordination: Coordination normal.     Gait: Gait abnormal.     Comments: Oriented to person and place. Rigidity in limbs.   Psychiatric:        Mood and Affect: Mood normal.        Behavior: Behavior normal.     Labs reviewed: Recent Labs    08/03/18 2036 09/15/18 10/05/18 0922 01/22/19  NA 134* 140 138 141  K 3.6 4.2 3.8 4.0  CL 106 104 103  --   CO2 20* 25 24  --   GLUCOSE 159*  --  122*  --   BUN 18 15 12 15   CREATININE 1.07 0.7 0.71 0.7  CALCIUM 9.0 8.8 8.9  --    Recent Labs    06/10/18 01/22/19  AST 12* 11*  ALT 18 16  ALKPHOS 89  --   PROT 6.3  --   ALBUMIN 4.1  --    Recent Labs    08/03/18 2036 08/05/18 0427 09/15/18 10/05/18 0922  WBC 18.0* 10.2 6.6 6.5  NEUTROABS  --   --   --  4.1  HGB 14.1 12.8* 12.8* 13.3  HCT 46.6 40.7 39* 43.5  MCV 92.6  91.9  --  93.1  PLT 275 215 196 217   Lab Results  Component Value Date   TSH 1.70 12/03/2017   Lab Results  Component Value Date   HGBA1C 6.1 01/22/2019   Lab Results  Component Value Date   CHOL 95 08/04/2018   HDL 31 (L) 08/04/2018   LDLCALC 47 08/04/2018   TRIG 84 08/04/2018   CHOLHDL 3.1 08/04/2018    Significant Diagnostic Results in last 30 days:  Ct Head Wo Contrast  Result Date: 03/26/2019 CLINICAL DATA:  Head trauma. Intracranial venous injury suspected. Fall getting up to go to the bathroom. Occipital scalp laceration. The patient is amnestic to the event but denies loss of consciousness. Initial encounter. EXAM: CT HEAD WITHOUT CONTRAST TECHNIQUE: Contiguous axial images were obtained from the base of the skull through the vertex without intravenous contrast. COMPARISON:  CT head without contrast 10/04/2018 FINDINGS: Brain: No acute infarct, hemorrhage, or mass lesion is present. Minimal white matter changes are within normal limits for age. The ventricles are of normal size. No significant extraaxial fluid collection is present. The brainstem and cerebellum are within normal limits. Vascular: Atherosclerotic calcifications are again noted within the cavernous internal carotid arteries. There is no hyperdense vessel. Skull: Left occipital scalp laceration and hematoma is present. There is no underlying fracture. Sinuses/Orbits: The paranasal sinuses and mastoid air cells are clear. The globes and orbits are within normal limits. Other: IMPRESSION: 1. Stable normal CT appearance of the brain for age. No acute intracranial abnormality. 2. Left occipital scalp laceration and hematoma without underlying fracture. Electronically Signed   By: Virl Sonhristopher  Mattern M.D.  On: 03/26/2019 04:31    Assessment/Plan: Scalp laceration, subsequent encounter No s/s of infection, healing nicely. Remove staples in 5-7 days from 03/26/19  Unsteady gait Lack of safety awareness increased gait  abnormality related to progressive supranuclear palsy/parkinson's  are contributory. Close supervision for safety. W/c for mobility.   Progressive supranuclear palsy (HCC) F/u Neurology ASAP, continue Sinemet 25/100mg  1.5 tab qid, Requip 0.25mg  qd.   Cognitive impairment Continue SN[facility] for safety and care assistance, continue Donepezil 10mg  qd   Recurrent falls Lack of safety awareness and abnormal gait are contributory to his recurrent falling. Close supervision, f/u neurology ASAP  Hypertension Blood pressure is controlled, continue Metoprolol 75mg  qd, Lisinopril 10mg  qd.     Family/ staff Communication: plan of care reviewed with the patient and charge nurse.   Labs/tests ordered: none  Time spend 25 minutes.

## 2019-03-30 NOTE — Assessment & Plan Note (Signed)
No s/s of infection, healing nicely. Remove staples in 5-7 days from 03/26/19

## 2019-04-05 ENCOUNTER — Emergency Department (HOSPITAL_COMMUNITY)
Admission: EM | Admit: 2019-04-05 | Discharge: 2019-04-05 | Disposition: A | Discharge status: Home / Self Care | Payer: BC Managed Care – PPO | Attending: Emergency Medicine | Admitting: Emergency Medicine

## 2019-04-05 ENCOUNTER — Other Ambulatory Visit: Payer: Self-pay

## 2019-04-05 ENCOUNTER — Encounter (HOSPITAL_COMMUNITY): Payer: Self-pay | Admitting: Pharmacy Technician

## 2019-04-05 ENCOUNTER — Emergency Department (HOSPITAL_COMMUNITY): Payer: BC Managed Care – PPO

## 2019-04-05 DIAGNOSIS — W19XXXA Unspecified fall, initial encounter: Secondary | ICD-10-CM

## 2019-04-05 DIAGNOSIS — W1830XA Fall on same level, unspecified, initial encounter: Secondary | ICD-10-CM | POA: Insufficient documentation

## 2019-04-05 DIAGNOSIS — Z79899 Other long term (current) drug therapy: Secondary | ICD-10-CM | POA: Insufficient documentation

## 2019-04-05 DIAGNOSIS — Y939 Activity, unspecified: Secondary | ICD-10-CM | POA: Insufficient documentation

## 2019-04-05 DIAGNOSIS — Y999 Unspecified external cause status: Secondary | ICD-10-CM | POA: Insufficient documentation

## 2019-04-05 DIAGNOSIS — S0101XA Laceration without foreign body of scalp, initial encounter: Secondary | ICD-10-CM

## 2019-04-05 DIAGNOSIS — S0181XA Laceration without foreign body of other part of head, initial encounter: Secondary | ICD-10-CM | POA: Diagnosis not present

## 2019-04-05 DIAGNOSIS — Y92019 Unspecified place in single-family (private) house as the place of occurrence of the external cause: Secondary | ICD-10-CM | POA: Diagnosis not present

## 2019-04-05 DIAGNOSIS — G20 Parkinson's disease: Secondary | ICD-10-CM | POA: Diagnosis not present

## 2019-04-05 DIAGNOSIS — T07XXXA Unspecified multiple injuries, initial encounter: Secondary | ICD-10-CM | POA: Diagnosis present

## 2019-04-05 DIAGNOSIS — E119 Type 2 diabetes mellitus without complications: Secondary | ICD-10-CM | POA: Diagnosis not present

## 2019-04-05 DIAGNOSIS — I2510 Atherosclerotic heart disease of native coronary artery without angina pectoris: Secondary | ICD-10-CM | POA: Diagnosis not present

## 2019-04-05 DIAGNOSIS — Z7982 Long term (current) use of aspirin: Secondary | ICD-10-CM | POA: Insufficient documentation

## 2019-04-05 DIAGNOSIS — S0990XA Unspecified injury of head, initial encounter: Secondary | ICD-10-CM

## 2019-04-05 LAB — CBG MONITORING, ED: Glucose-Capillary: 92 mg/dL (ref 70–99)

## 2019-04-05 MED ORDER — LIDOCAINE-EPINEPHRINE (PF) 2 %-1:200000 IJ SOLN
20.0000 mL | Freq: Once | INTRAMUSCULAR | Status: AC
  Administered 2019-04-05: 20 mL
  Filled 2019-04-05: qty 20

## 2019-04-05 NOTE — Discharge Instructions (Addendum)
You were seen in the emergency department for evaluation of injuries from a fall.  You had a CAT scan of your head and cervical spine that not show any acute findings.  You had a laceration on your scalp that was stapled and a laceration on your face that was sutured.  These will need to be removed in 5 to 7 days.  Please watch for any signs of infection.  Return if any concerns.

## 2019-04-05 NOTE — ED Provider Notes (Signed)
[facility] Provider Note   CSN: [id number] Arrival date & time: 04/05/19  1247     History   Chief Complaint Chief Complaint  Patient presents with  . Fall    HPI Alan Wells is a 63 y.o. male.  He has a history of Parkinson's disease and unsteady gait and he is here after a fall at his facility today with a head laceration.  He denies any loss consciousness.  He denies any complaints.  No chest pain or shortness of breath.  Per EMS he was getting out of a chair and stumbled and hit his head on the wall.  He is not on anticoagulation.  No numbness or weakness.     The history is provided by the patient.  Fall This is a recurrent problem. The current episode started 1 to 2 hours ago. The problem has been resolved. Pertinent negatives include no chest pain, no abdominal pain, no headaches and no shortness of breath. Nothing aggravates the symptoms. Nothing relieves the symptoms. He has tried nothing for the symptoms. The treatment provided no relief.    Past Medical History:  Diagnosis Date  . Adult onset fluency disorder   . Coronary artery disease    a. s/p stent left anterior descending artery remotely (per 2017 note, "about 4 years" prior). b. Normal nuc 05/2015.  . Diabetes mellitus type 2, controlled (HCC) 01/18/2016  . Dysphagia 2018  . Gait instability 2018  . History of falling   . Hypercholesterolemia   . Hypertension   . Memory change 2018  . Parkinson's disease (HCC) 2018  . Weight loss 2017   intentional    Patient Active Problem List   Diagnosis Date Noted  . Weight loss 03/25/2019  . Hematoma 03/14/2019  . Recurrent falls 01/20/2019  . Scalp laceration, subsequent encounter 10/06/2018  . Dizziness 10/06/2018  . Leukocytosis 08/03/2018  . Constipation 06/03/2017  . Fall 06/03/2017  . Obesity (BMI 30-39.9) 05/10/2017  . Insomnia 03/19/2017  . NSTEMI (non-ST elevated myocardial infarction) (HCC)   . Hypertension    . Hypercholesterolemia   . 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 Cognitive impairment 09/17/2016  . Unsteady gait 09/17/2016  . Dysphagia 09/17/2016  . Diabetes mellitus type 2, controlled (HCC) 01/18/2016  . Coronary artery disease 09/17/2014    Past Surgical History:  Procedure Laterality Date  . CAROTID STENT  2012   in left anterior descending artery past EF 55%  . nuclear stress test  06/17/2015   Gated SPECT images reveal normal LV systolic function. EF 55%. Comparing the rest & stress SPECT images, no ischemia. Gated images are normal. Dr. Vernell BarrierWaheed Akhtar, [place]         Home Medications    Prior to Admission medications   Medication Sig Start Date End Date Taking? Authorizing Provider  aspirin 81 MG chewable tablet Chew 81 mg by mouth daily.     [provider]  atorvastatin (LIPITOR) 40 MG tablet Take 40 mg by mouth at bedtime. \    [provider]  carbidopa-levodopa (SINEMET IR) 25-100 MG tablet Take 1.5 tablets by mouth 4 (four) times daily. Take 1 and 1/2 tablets four times a day    [provider]  donepezil (ARICEPT) 10 MG tablet Take 10 mg by mouth at bedtime.    [provider]  lisinopril (PRINIVIL,ZESTRIL) 10 MG tablet Take 10 mg by mouth daily.     [provider]  Metoprolol  Succinate 50 MG CS24 Take 75 mg by mouth daily. Take 1 1/2 tabs once a day    [provider]  rOPINIRole (REQUIP) 0.25 MG tablet Take 0.25 mg by mouth at bedtime.     [provider]  sennosides-docusate sodium (SENOKOT-S) 8.6-50 MG tablet Take 1 tablet by mouth every other day.     [provider]    Family History Family History  Problem Relation Age of Onset  . Cancer Mother   . Heart disease Mother     Social History Social History   Tobacco Use  . Smoking status: Never Smoker  . Smokeless tobacco: Never Used  Substance Use Topics  . Alcohol use: No  . Drug use: No      Allergies   Codeine   Review of Systems Review of Systems  Constitutional: Negative for fever.  HENT: Negative for sore throat.   Eyes: Negative for visual disturbance.  Respiratory: Negative for shortness of breath.   Cardiovascular: Negative for chest pain.  Gastrointestinal: Negative for abdominal pain.  Genitourinary: Negative for dysuria.  Musculoskeletal: Negative for neck pain.  Skin: Positive for wound. Negative for rash.  Neurological: Negative for headaches.     Physical Exam Updated Vital Signs BP 139/80   Pulse 69   Temp 98.1 F (36.7 C) (Oral)   Resp 14   Ht 6' (1.829 m)   Wt 106.6 kg   SpO2 94%   BMI 31.87 kg/m   Physical Exam Vitals signs and nursing note reviewed.  Constitutional:      Appearance: He is well-developed.  HENT:     Head: Normocephalic.     Comments: He has a approximate 5 cm curvilinear laceration on the top of his head and then another laceration approximately 2 cm over his left eye.    Nose: Nose normal.     Mouth/Throat:     Mouth: Mucous membranes are moist.     Pharynx: Oropharynx is clear.  Eyes:     Conjunctiva/sclera: Conjunctivae normal.  Neck:     Musculoskeletal: Neck supple.  Cardiovascular:     Rate and Rhythm: Normal rate and regular rhythm.     Heart sounds: No murmur.  Pulmonary:     Effort: Pulmonary effort is normal. No respiratory distress.     Breath sounds: Normal breath sounds.  Abdominal:     Palpations: Abdomen is soft.     Tenderness: There is no abdominal tenderness.  Musculoskeletal: Normal range of motion.        General: No tenderness or signs of injury.  Skin:    General: Skin is warm and dry.     Capillary Refill: Capillary refill takes less than 2 seconds.  Neurological:     General: No focal deficit present.     Mental Status: He is alert. Mental status is at baseline.      ED Treatments / Results  Labs (all labs ordered are listed, but only abnormal results are displayed) Labs  Reviewed  CBG MONITORING, ED    EKG None  Radiology Ct Head Wo Contrast  Result Date: 04/05/2019 CLINICAL DATA:  Head laceration after fall. No loss of consciousness. EXAM: CT HEAD WITHOUT CONTRAST CT CERVICAL SPINE WITHOUT CONTRAST TECHNIQUE: Multidetector CT imaging of the head and cervical spine was performed following the standard protocol without intravenous contrast. Multiplanar CT image reconstructions of the cervical spine were also generated. COMPARISON:  CT scan of March 26, 2019. FINDINGS: CT HEAD FINDINGS Brain: No  evidence of acute infarction, hemorrhage, hydrocephalus, extra-axial collection or mass lesion/mass effect. Vascular: No hyperdense vessel or unexpected calcification. Skull: Normal. Negative for fracture or focal lesion. Sinuses/Orbits: No acute finding. Other: None. CT CERVICAL SPINE FINDINGS Alignment: Normal. Skull base and vertebrae: No acute fracture. No primary bone lesion or focal pathologic process. Soft tissues and spinal canal: No prevertebral fluid or swelling. No visible canal hematoma. Disc levels: Mild degenerative disc disease is noted at C4-5, C5-6 and C6-7 with anterior posterior osteophyte formation. Upper chest: Negative. Other: Degenerative changes are seen involving posterior facet joints bilaterally. IMPRESSION: Normal head CT. Mild multilevel degenerative disc disease. No acute abnormality seen in the cervical spine. Electronically Signed   By: Lupita RaiderJames  Green Jr M.D.   On: 04/05/2019 14:01   Ct Cervical Spine Wo Contrast  Result Date: 04/05/2019 CLINICAL DATA:  Head laceration after fall. No loss of consciousness. EXAM: CT HEAD WITHOUT CONTRAST CT CERVICAL SPINE WITHOUT CONTRAST TECHNIQUE: Multidetector CT imaging of the head and cervical spine was performed following the standard protocol without intravenous contrast. Multiplanar CT image reconstructions of the cervical spine were also generated. COMPARISON:  CT scan of March 26, 2019. FINDINGS: CT HEAD  FINDINGS Brain: No evidence of acute infarction, hemorrhage, hydrocephalus, extra-axial collection or mass lesion/mass effect. Vascular: No hyperdense vessel or unexpected calcification. Skull: Normal. Negative for fracture or focal lesion. Sinuses/Orbits: No acute finding. Other: None. CT CERVICAL SPINE FINDINGS Alignment: Normal. Skull base and vertebrae: No acute fracture. No primary bone lesion or focal pathologic process. Soft tissues and spinal canal: No prevertebral fluid or swelling. No visible canal hematoma. Disc levels: Mild degenerative disc disease is noted at C4-5, C5-6 and C6-7 with anterior posterior osteophyte formation. Upper chest: Negative. Other: Degenerative changes are seen involving posterior facet joints bilaterally. IMPRESSION: Normal head CT. Mild multilevel degenerative disc disease. No acute abnormality seen in the cervical spine. Electronically Signed   By: Lupita RaiderJames  Green Jr M.D.   On: 04/05/2019 14:01    Procedures .[name].Laceration Repair  Date/Time: 04/05/2019 1:24 PM Performed by: Terrilee FilesButler, Michael C, MD Authorized by: Terrilee FilesButler, Michael C, MD   Consent:    Consent obtained:  Verbal   Consent given by:  Patient   Risks discussed:  Infection, pain, poor cosmetic result, poor wound healing and retained foreign body   Alternatives discussed:  No treatment and delayed treatment Anesthesia (see MAR for exact dosages):    Anesthesia method:  Local infiltration   Local anesthetic:  Lidocaine 2% WITH epi Laceration details:    Location:  Scalp   Scalp location:  Crown   Length (cm):  5 Repair type:    Repair type:  Simple Exploration:    Contaminated: no   Treatment:    Area cleansed with:  Saline   Amount of cleaning:  Standard Skin repair:    Repair method:  Staples   Number of staples:  5 Approximation:    Approximation:  Close Post-procedure details:    Dressing:  Antibiotic ointment   Patient tolerance of procedure:  Tolerated well, no immediate complications  .[name].Laceration Repair  Date/Time: 04/05/2019 1:24 PM Performed by: Terrilee FilesButler, Michael C, MD Authorized by: Terrilee FilesButler, Michael C, MD   Consent:    Consent obtained:  Verbal   Consent given by:  Patient   Risks discussed:  Infection, pain, poor cosmetic result, poor wound healing and retained foreign body   Alternatives discussed:  No treatment and delayed treatment Anesthesia (see MAR for exact dosages):    Anesthesia  method:  Local infiltration   Local anesthetic:  Lidocaine 2% WITH epi Laceration details:    Location:  Face   Face location:  L upper eyelid   Extent:  Superficial   Length (cm):  2 Repair type:    Repair type:  Simple Pre-procedure details:    Preparation:  Patient was prepped and draped in usual sterile fashion Treatment:    Area cleansed with:  Saline   Amount of cleaning:  Standard   Irrigation solution:  Sterile saline Skin repair:    Repair method:  Sutures   Suture size:  6-0   Suture material:  Nylon   Suture technique:  Simple interrupted   Number of sutures:  3 Approximation:    Approximation:  Close Post-procedure details:    Dressing:  Antibiotic ointment   Patient tolerance of procedure:  Tolerated well, no immediate complications   (including critical care time)  Medications Ordered in ED Medications  lidocaine-EPINEPHrine (XYLOCAINE W/EPI) 2 %-1:200000 (PF) injection 20 mL (has no administration in time range)     Initial Impression / Assessment and Plan / ED Course  I have reviewed the triage vital signs and the nursing notes.  Pertinent labs & imaging results that were available during my care of the patient were reviewed by me and considered in my medical decision making (see chart for details).  Clinical Course as of Apr 04 1729  Sun Apr 05, 2019  [id number] 63 year old male with Parkinson's and multiple falls here after mechanical fall getting up out of a chair.  He had a scalp plaque that was stapled and also a facial laceration that was  sutured.  Differential includes skull fracture, head bleed, cervical fracture, head injury.  His CAT scan did not show any signs of fracture or bleeding and his cervical spine did not show a fracture.  Anticipate discharging back to his facility.   [MB]    Clinical Course User Index [MB] Terrilee FilesButler, Michael C, MD       Final Clinical Impressions(s) / ED Diagnoses   Final diagnoses:  Fall, initial encounter  Injury of head, initial encounter  Laceration of scalp, initial encounter  Facial laceration, initial encounter    ED Discharge Orders    None       Terrilee FilesButler, Michael C, MD 04/05/19 1730

## 2019-04-05 NOTE — ED Triage Notes (Signed)
Pt [facility] from [facility] with reports of fall with head laceration. VSS with ems. Denies LOC. Hx parkinsons. Per ems, pt was getting out of his chair and stumbled. Hit head on wall. Not on blood thinners.

## 2019-04-07 ENCOUNTER — Encounter: Payer: Self-pay | Admitting: Internal Medicine

## 2019-04-07 ENCOUNTER — Non-Acute Institutional Stay: Payer: BC Managed Care – PPO | Admitting: Internal Medicine

## 2019-04-07 DIAGNOSIS — R296 Repeated falls: Secondary | ICD-10-CM

## 2019-04-07 DIAGNOSIS — G231 Progressive supranuclear ophthalmoplegia [Steele-Richardson-Olszewski]: Secondary | ICD-10-CM

## 2019-04-07 DIAGNOSIS — I2510 Atherosclerotic heart disease of native coronary artery without angina pectoris: Secondary | ICD-10-CM | POA: Diagnosis not present

## 2019-04-07 DIAGNOSIS — I10 Essential (primary) hypertension: Secondary | ICD-10-CM

## 2019-04-07 NOTE — Progress Notes (Signed)
Location:  [facility] Room Number: 4/B Place of Service:  SNF (31) Provider:Austen Wygant L,MD   Mahlon GammonGupta, Jontae Sonier L, MD  Patient Care Team: Mahlon GammonGupta, Erion Hermans L, MD as PCP - General (Internal Medicine) [name], [name], NP as Nurse Practitioner (Internal Medicine)  Extended Emergency Contact Information Primary Emergency Contact: Jacques,Frank Address: [address]          [place], [place] [address] [place] Home Phone: [phone] Mobile Phone: [phone] Relation: Brother  Code Status: DNR  Goals of care: Advanced Directive information Advanced Directives 04/07/2019  Does Patient Have a Medical Advance Directive? Yes  Type of Estate agentAdvance Directive Healthcare Power of [place];Out of facility DNR (pink MOST or yellow form)  Does patient want to make changes to medical advance directive? No - Patient declined  Copy of Healthcare Power of Attorney in Chart? Yes - validated most recent copy scanned in chart (See row information)  Would patient like information on creating a medical advance directive? No - Patient declined  Pre-existing out of facility DNR order (yellow form or pink MOST form) Yellow form placed in chart (order not valid for inpatient use)     Chief Complaint  Patient presents with   Acute Visit    Falls    Health Maintenance    hepatitis C screening, colonoscopy, ophthamology exam, foot exam     HPI:  Pt is a 63 y.o. male seen today for an acute visit for Recurrent Falls  Patient has a history of recurrent falls,Parkinson disease diagnosed in 2018with ? PSP,diabetes mellitus, coronary artery disease,hypertension, hyperlipidemia, cognitive impairment  Patient was just SNF from his apartment due to recurrent falls.  Patient continues to fall here to.   We have discussed many times with the patient to call for help but he usually gets by himself as he forgets and then he states his legs just give away any falls.   He has not been  orthostatic. [name].  He has denied any dizziness.  He has been seen by therapy He fell few days ago and had another laceration.  He was sent to the ED.  He had staples placed. His CT scan of head was negative.  CT of cervical spine showed degenerative changes but no acute change. Patient seen in his room.  He was back to his baseline .denied any headache.  Past Medical History:  Diagnosis Date   Adult onset fluency disorder    Coronary artery disease    a. s/p stent left anterior descending artery remotely (per 2017 note, "about 4 years" prior). b. Normal nuc 05/2015.   Diabetes mellitus type 2, controlled (HCC) 01/18/2016   Dysphagia 2018   Gait instability 2018   History of falling    Hypercholesterolemia    Hypertension    Memory change 2018   Parkinson's disease (HCC) 2018   Weight loss 2017   intentional   Past Surgical History:  Procedure Laterality Date   CAROTID STENT  2012   in left anterior descending artery past EF 55%   nuclear stress test  06/17/2015   Gated SPECT images reveal normal LV systolic function. EF 55%. Comparing the rest & stress SPECT images, no ischemia. Gated images are normal. Dr. Vernell BarrierWaheed Akhtar, MD [place]     Allergies  Allergen Reactions   Codeine     Unknown     Outpatient Encounter Medications as of 04/07/2019  Medication Sig   aspirin 81 MG chewable tablet Chew 81 mg  by mouth daily.    atorvastatin (LIPITOR) 40 MG tablet Take 40 mg by mouth at bedtime. \   carbidopa-levodopa (SINEMET IR) 25-100 MG tablet Take 1.5 tablets by mouth 4 (four) times daily. Take 1 and 1/2 tablets four times a day   donepezil (ARICEPT) 10 MG tablet Take 10 mg by mouth at bedtime.   lisinopril (PRINIVIL,ZESTRIL) 10 MG tablet Take 10 mg by mouth daily.    Metoprolol Succinate 50 MG CS24 Take 75 mg by mouth daily. Take 1 1/2 tabs once a day   rOPINIRole (REQUIP) 0.25 MG tablet Take 0.25 mg by mouth at bedtime.    sennosides-docusate sodium  (SENOKOT-S) 8.6-50 MG tablet Take 1 tablet by mouth every other day.    No facility-administered encounter medications on file as of 04/07/2019.     Review of Systems  Review of Systems  Constitutional: Negative for activity change, appetite change, chills, diaphoresis, fatigue and fever.  HENT: Negative for mouth sores, postnasal drip, rhinorrhea, sinus pain and sore throat.   Respiratory: Negative for apnea, cough, chest tightness, shortness of breath and wheezing.   Cardiovascular: Negative for chest pain, palpitations and leg swelling.  Gastrointestinal: Negative for abdominal distention, abdominal pain, constipation, diarrhea, nausea and vomiting.  Genitourinary: Negative for dysuria and frequency.  Musculoskeletal: Negative for arthralgias, joint swelling and myalgias.  Skin: Negative for rash.  Neurological: Negative for dizziness, syncope, weakness, light-headedness and numbness.  Psychiatric/Behavioral: Negative for behavioral problems, confusion and sleep disturbance.     Immunization History  Administered Date(s) Administered   Influenza Whole 06/20/2018   Influenza-Unspecified 07/08/2017   PPD Test 01/15/2017   Pneumococcal Polysaccharide-23 08/05/2018   Td 09/17/2016   Pertinent  Health Maintenance Due  Topic Date Due   COLONOSCOPY  06/03/2004   OPHTHALMOLOGY EXAM  10/08/2017   FOOT EXAM  01/21/2018   INFLUENZA VACCINE  04/18/2019   HEMOGLOBIN A1C  07/25/2019   Fall Risk  01/21/2017  Falls in the past year? Yes  Number falls in past yr: 2 or more  Injury with Fall? Yes  Risk Factor Category  High Fall Risk  Risk for fall due to : History of fall(s);Impaired balance/gait;Impaired mobility  Follow up Falls evaluation completed   Functional Status Survey:    Vitals:   04/07/19 0906  BP: 128/74  Pulse: 63  Resp: 17  Temp: 97.7 F (36.5 C)  SpO2: 93%  Weight: 232 lb 9.6 oz (105.5 kg)  Height: 5\' 11"  (1.803 m)   Body mass index is 32.44  kg/m. Physical Exam  Constitutional:  Well-developed and well-nourished.  HENT:  Head: Normocephalic. Has Staples on his Forehead Mouth/Throat: Oropharynx is clear and moist.  Eyes: Pupils are equal, round, and reactive to light.  Neck: Neck supple.  Cardiovascular: Normal rate and normal heart sounds.  No murmur heard. Pulmonary/Chest: Effort normal and breath sounds normal. No respiratory distress. No wheezes. She has no rales.  Abdominal: Soft. Bowel sounds are normal. No distension. There is no tenderness. There is no rebound.  Musculoskeletal: No edema.  Lymphadenopathy: none Neurological: No Focal Deficits Skin: Skin is warm and dry.  Psychiatric: Normal mood and affect. Behavior is normal. Thought content normal.    Labs reviewed: Recent Labs    08/03/18 2036 09/15/18 10/05/18 0922 01/22/19 03/27/19  NA 134* 140 138 141 141  K 3.6 4.2 3.8 4.0 4.1  CL 106 104 103  --   --   CO2 20* 25 24  --   --  GLUCOSE 159*  --  122*  --   --   BUN 18 15 12 15 12   CREATININE 1.07 0.7 0.71 0.7 0.8  CALCIUM 9.0 8.8 8.9  --   --    Recent Labs    06/10/18 01/22/19 03/27/19  AST 12* 11* 10*  ALT 18 16 15   ALKPHOS 89  --   --   PROT 6.3  --   --   ALBUMIN 4.1  --   --    Recent Labs    08/03/18 2036 08/05/18 0427 09/15/18 10/05/18 0922 03/27/19  WBC 18.0* 10.2 6.6 6.5  --   NEUTROABS  --   --   --  4.1  --   HGB 14.1 12.8* 12.8* 13.3 13.0*  HCT 46.6 40.7 39* 43.5 40*  MCV 92.6 91.9  --  93.1  --   PLT 275 215 196 217 243   Lab Results  Component Value Date   TSH 2.51 03/27/2019   Lab Results  Component Value Date   HGBA1C 6.1 01/22/2019   Lab Results  Component Value Date   CHOL 95 08/04/2018   HDL 31 (L) 08/04/2018   LDLCALC 47 08/04/2018   TRIG 84 08/04/2018   CHOLHDL 3.1 08/04/2018    Significant Diagnostic Results in last 30 days:  Ct Head Wo Contrast  Result Date: 04/05/2019 CLINICAL DATA:  Head laceration after fall. No loss of consciousness. EXAM:  CT HEAD WITHOUT CONTRAST CT CERVICAL SPINE WITHOUT CONTRAST TECHNIQUE: Multidetector CT imaging of the head and cervical spine was performed following the standard protocol without intravenous contrast. Multiplanar CT image reconstructions of the cervical spine were also generated. COMPARISON:  CT scan of March 26, 2019. FINDINGS: CT HEAD FINDINGS Brain: No evidence of acute infarction, hemorrhage, hydrocephalus, extra-axial collection or mass lesion/mass effect. Vascular: No hyperdense vessel or unexpected calcification. Skull: Normal. Negative for fracture or focal lesion. Sinuses/Orbits: No acute finding. Other: None. CT CERVICAL SPINE FINDINGS Alignment: Normal. Skull base and vertebrae: No acute fracture. No primary bone lesion or focal pathologic process. Soft tissues and spinal canal: No prevertebral fluid or swelling. No visible canal hematoma. Disc levels: Mild degenerative disc disease is noted at C4-5, C5-6 and C6-7 with anterior posterior osteophyte formation. Upper chest: Negative. Other: Degenerative changes are seen involving posterior facet joints bilaterally. IMPRESSION: Normal head CT. Mild multilevel degenerative disc disease. No acute abnormality seen in the cervical spine. Electronically Signed   By: Lupita RaiderJames  Green Jr M.D.   On: 04/05/2019 14:01   Ct Head Wo Contrast  Result Date: 03/26/2019 CLINICAL DATA:  Head trauma. Intracranial venous injury suspected. Fall getting up to go to the bathroom. Occipital scalp laceration. The patient is amnestic to the event but denies loss of consciousness. Initial encounter. EXAM: CT HEAD WITHOUT CONTRAST TECHNIQUE: Contiguous axial images were obtained from the base of the skull through the vertex without intravenous contrast. COMPARISON:  CT head without contrast 10/04/2018 FINDINGS: Brain: No acute infarct, hemorrhage, or mass lesion is present. Minimal white matter changes are within normal limits for age. The ventricles are of normal size. No significant  extraaxial fluid collection is present. The brainstem and cerebellum are within normal limits. Vascular: Atherosclerotic calcifications are again noted within the cavernous internal carotid arteries. There is no hyperdense vessel. Skull: Left occipital scalp laceration and hematoma is present. There is no underlying fracture. Sinuses/Orbits: The paranasal sinuses and mastoid air cells are clear. The globes and orbits are within normal limits. Other: IMPRESSION: 1.  Stable normal CT appearance of the brain for age. No acute intracranial abnormality. 2. Left occipital scalp laceration and hematoma without underlying fracture. Electronically Signed   By: San Morelle M.D.   On: 03/26/2019 04:31   Ct Cervical Spine Wo Contrast  Result Date: 04/05/2019 CLINICAL DATA:  Head laceration after fall. No loss of consciousness. EXAM: CT HEAD WITHOUT CONTRAST CT CERVICAL SPINE WITHOUT CONTRAST TECHNIQUE: Multidetector CT imaging of the head and cervical spine was performed following the standard protocol without intravenous contrast. Multiplanar CT image reconstructions of the cervical spine were also generated. COMPARISON:  CT scan of March 26, 2019. FINDINGS: CT HEAD FINDINGS Brain: No evidence of acute infarction, hemorrhage, hydrocephalus, extra-axial collection or mass lesion/mass effect. Vascular: No hyperdense vessel or unexpected calcification. Skull: Normal. Negative for fracture or focal lesion. Sinuses/Orbits: No acute finding. Other: None. CT CERVICAL SPINE FINDINGS Alignment: Normal. Skull base and vertebrae: No acute fracture. No primary bone lesion or focal pathologic process. Soft tissues and spinal canal: No prevertebral fluid or swelling. No visible canal hematoma. Disc levels: Mild degenerative disc disease is noted at C4-5, C5-6 and C6-7 with anterior posterior osteophyte formation. Upper chest: Negative. Other: Degenerative changes are seen involving posterior facet joints bilaterally. IMPRESSION:  Normal head CT. Mild multilevel degenerative disc disease. No acute abnormality seen in the cervical spine. Electronically Signed   By: Marijo Conception M.D.   On: 04/05/2019 14:01    Assessment/Plan Parkinson's diseasewith ? PSPwith Unsteady Gait and Recurrent Falls I have discussed about patient with the nurse in charge. They are trying to see if they can get more supervision for him during the daytime.  Unfortunately some most of these episodes have happened during the night. He will also be seen by neurology this week. We will continue Requip and Sinemet  Laceration on Forehead Healing Well. Staples to be removed in few days   Other Issues are stable CAD Was seen by Cardiologist in 11/19 when he was admitted with Syncope. Work up was negative Echo was negative for any acute process On Beta blocker and statin Essential hypertension With these Falls and Bradycardia I had decreased his Metoprolol to 75 mg His BP seems controlled and no more Bradycardia Continue to check his BP Loose control of BP  Hypercholesterolemia Last LDL was 47 in 11/19 On Statin   Cognitive impairment On Aricept  Family/ staff Communication:   Labs/tests ordered:   Total time spent in this patient care encounter was  25_  minutes; greater than 50% of the visit spent counseling patient and staff, reviewing records , Labs and coordinating care for problems addressed at this encounter.

## 2019-04-22 ENCOUNTER — Telehealth: Payer: BC Managed Care – PPO | Admitting: Cardiovascular Disease

## 2019-05-04 ENCOUNTER — Other Ambulatory Visit: Payer: Self-pay | Admitting: *Deleted

## 2019-05-13 ENCOUNTER — Non-Acute Institutional Stay (SKILLED_NURSING_FACILITY): Payer: Medicare Other | Admitting: Nurse Practitioner

## 2019-05-13 ENCOUNTER — Encounter: Payer: Self-pay | Admitting: Nurse Practitioner

## 2019-05-13 DIAGNOSIS — R4189 Other symptoms and signs involving cognitive functions and awareness: Secondary | ICD-10-CM

## 2019-05-13 DIAGNOSIS — I10 Essential (primary) hypertension: Secondary | ICD-10-CM | POA: Diagnosis not present

## 2019-05-13 DIAGNOSIS — G231 Progressive supranuclear ophthalmoplegia [Steele-Richardson-Olszewski]: Secondary | ICD-10-CM

## 2019-05-13 NOTE — Assessment & Plan Note (Signed)
Continue SNF [facility] for safety, care assistance, continue Donepezil 10mg  qd, continue close supervision for safety.

## 2019-05-13 NOTE — Progress Notes (Signed)
Location:  [place] Room Number: 4B Place of Service:  SNF (31) Provider:  Marlana Latus  NP  [name], MD  Patient Care Team: [name], MD as PCP - General (Internal Medicine) Ayasha Ellingsen X, NP as Nurse Practitioner (Internal Medicine)  Extended Emergency Contact Information Primary Emergency Contact: Shaheen,Frank Address: [place], [place] [address] [place] Phone: [phone] Mobile Phone: [phone] Relation: Brother  Code Status:  dnr Goals of care: Advanced Directive information Advanced Directives 05/13/2019  Does Patient Have a Medical Advance Directive? Yes  Type of Paramedic of [place];Out of facility DNR (pink MOST or yellow form)  Does patient want to make changes to medical advance directive? No - Patient declined  Copy of [place] in Chart? Yes - validated most recent copy scanned in chart (See row information)  Would patient like information on creating a medical advance directive? -  Pre-existing out of facility DNR order (yellow form or pink MOST form) Yellow form placed in chart (order not valid for inpatient use)     Chief Complaint  Patient presents with  . Medical Management of Chronic Issues    HPI:  Pt is a 63 y.o. male seen today for medical management of chronic diseases.    The patient resides in SNF [facility] for safety and care assistance, self transfers, w/c for mobility, ambulates with walker with SBA sometimes. Progressive supranuclear palsy, frequent falling, f/u neurology, taking Sinemet 25/100mg  II qid, Requip 0.25mg  qhs. HTN, blood pressure is controlled on Metoprolol 75mg  qd, Lisinopril 10mg  qd. Dementia, stable, taking Donepezil 10mg  qd for memory.   Past Medical History:  Diagnosis Date  . Adult onset fluency disorder   . Coronary artery disease    a. s/p stent left anterior descending artery remotely (per 2017 note,  "about 4 years" prior). b. Normal nuc 05/2015.  . Diabetes mellitus type 2, controlled ([place]) 01/18/2016  . Dysphagia 2018  . Gait instability 2018  . History of falling   . Hypercholesterolemia   . Hypertension   . Memory change 2018  . Parkinson's disease ([place]) 2018  . Weight loss 2017   intentional   Past Surgical History:  Procedure Laterality Date  . CAROTID STENT  2012   in left anterior descending artery past EF 55%  . nuclear stress test  06/17/2015   Gated SPECT images reveal normal LV systolic function. EF 55%. Comparing the rest & stress SPECT images, no ischemia. Gated images are normal. Dr. [name], MD [name], [name]     Allergies  Allergen Reactions  . Codeine     Unknown     Outpatient Encounter Medications as of 05/13/2019  Medication Sig  . aspirin 81 MG chewable tablet Chew 81 mg by mouth daily.   [name] atorvastatin (LIPITOR) 40 MG tablet Take 40 mg by mouth at bedtime. \  . carbidopa-levodopa (SINEMET IR) 25-100 MG tablet Take 2 tablets by mouth 4 (four) times daily.   [name] donepezil (ARICEPT) 10 MG tablet Take 10 mg by mouth at bedtime.  [name] lisinopril (PRINIVIL,ZESTRIL) 10 MG tablet Take 10 mg by mouth daily.   . Metoprolol Succinate 50 MG CS24 Take 75 mg by mouth daily. Take 1 1/2 tabs once a day  . rOPINIRole (REQUIP) 0.25 MG tablet Take 0.25 mg by mouth at bedtime.   . sennosides-docusate sodium (SENOKOT-S) 8.6-50 MG tablet Take  1 tablet by mouth every other day.    No facility-administered encounter medications on file as of 05/13/2019.    ROS was provided with assistance of staff Review of Systems  Constitutional: Negative for activity change, appetite change, chills, diaphoresis, fatigue, fever and unexpected weight change.  HENT: Positive for hearing loss. Negative for congestion and voice change.   Respiratory: Negative for cough, shortness of breath and wheezing.   Cardiovascular: Negative for chest pain, palpitations and leg swelling.   Gastrointestinal: Negative for abdominal distention, abdominal pain, constipation, diarrhea, nausea and vomiting.  Genitourinary: Negative for difficulty urinating, dysuria and urgency.  Musculoskeletal: Positive for gait problem.  Skin: Negative for color change and pallor.  Neurological: Negative for dizziness, speech difficulty, weakness and headaches.       Dementia  Psychiatric/Behavioral: Negative for agitation, behavioral problems, hallucinations and sleep disturbance. The patient is not nervous/anxious.     Immunization History  Administered Date(s) Administered  . Influenza Whole 06/20/2018  . Influenza-Unspecified 07/08/2017  . PPD Test 01/15/2017  . Pneumococcal Polysaccharide-23 08/05/2018  . Td 09/17/2016   Pertinent  Health Maintenance Due  Topic Date Due  . COLONOSCOPY  06/03/2004  . OPHTHALMOLOGY EXAM  10/08/2017  . FOOT EXAM  01/21/2018  . INFLUENZA VACCINE  04/18/2019  . HEMOGLOBIN A1C  07/25/2019   Fall Risk  01/21/2017  Falls in the past year? Yes  Number falls in past yr: 2 or more  Injury with Fall? Yes  Risk Factor Category  High Fall Risk  Risk for fall due to : History of fall(s);Impaired balance/gait;Impaired mobility  Follow up Falls evaluation completed   Functional Status Survey:    Vitals:   05/13/19 0923  BP: 124/62  Pulse: 68  Resp: 18  Temp: (!) 97.4 F (36.3 C)  SpO2: 95%  Weight: 234 lb 3.2 oz (106.2 kg)  Height: 5\' 11"  (1.803 m)   Body mass index is 32.66 kg/m. Physical Exam Vitals signs and nursing note reviewed.  Constitutional:      General: He is not in acute distress.    Appearance: Normal appearance. He is obese. He is not ill-appearing, toxic-appearing or diaphoretic.  HENT:     Head: Normocephalic and atraumatic.     Nose: Nose normal.     Mouth/Throat:     Mouth: Mucous membranes are moist.  Eyes:     Extraocular Movements: Extraocular movements intact.     Conjunctiva/sclera: Conjunctivae normal.     Pupils:  Pupils are equal, round, and reactive to light.     Comments: Difficulty eye movement.   Neck:     Musculoskeletal: Normal range of motion and neck supple.  Cardiovascular:     Rate and Rhythm: Normal rate and regular rhythm.     Heart sounds: No murmur.  Pulmonary:     Breath sounds: No wheezing, rhonchi or rales.  Abdominal:     Palpations: Abdomen is soft.     Tenderness: There is no abdominal tenderness. There is no right CVA tenderness, left CVA tenderness, guarding or rebound.  Musculoskeletal:        General: No tenderness.     Comments: W/c for mobility, ambulates with walker SBA  Skin:    General: Skin is warm and dry.  Neurological:     General: No focal deficit present.     Mental Status: He is alert. Mental status is at baseline.     Cranial Nerves: No cranial nerve deficit.     Motor: No weakness.  Coordination: Coordination abnormal.     Gait: Gait abnormal.     Comments: Oriented to person, place. Muscle rigidity over all  Psychiatric:        Mood and Affect: Mood normal.        Behavior: Behavior normal.        Thought Content: Thought content normal.     Labs reviewed: Recent Labs    08/03/18 2036 09/15/18 10/05/18 0922 01/22/19 03/27/19  NA 134* 140 138 141 141  K 3.6 4.2 3.8 4.0 4.1  CL 106 104 103  --   --   CO2 20* 25 24  --   --   GLUCOSE 159*  --  122*  --   --   BUN 18 15 12 15 12   CREATININE 1.07 0.7 0.71 0.7 0.8  CALCIUM 9.0 8.8 8.9  --   --    Recent Labs    06/10/18 01/22/19 03/27/19  AST 12* 11* 10*  ALT 18 16 15   ALKPHOS 89  --   --   PROT 6.3  --   --   ALBUMIN 4.1  --   --    Recent Labs    08/03/18 2036 08/05/18 0427 09/15/18 10/05/18 0922 03/27/19  WBC 18.0* 10.2 6.6 6.5  --   NEUTROABS  --   --   --  4.1  --   HGB 14.1 12.8* 12.8* 13.3 13.0*  HCT 46.6 40.7 39* 43.5 40*  MCV 92.6 91.9  --  93.1  --   PLT 275 215 196 217 243   Lab Results  Component Value Date   TSH 2.51 03/27/2019   Lab Results  Component  Value Date   HGBA1C 6.1 01/22/2019   Lab Results  Component Value Date   CHOL 95 08/04/2018   HDL 31 (L) 08/04/2018   LDLCALC 47 08/04/2018   TRIG 84 08/04/2018   CHOLHDL 3.1 08/04/2018    Significant Diagnostic Results in last 30 days:  No results found.  Assessment/Plan Hypertension Blood pressure is controlled, continue Lisinopril 10mg  qd, Metoprolol 75mg  qd.   Progressive supranuclear palsy (HCC) frequent falling, f/u neurology, continue Sinemet 25/100mg  II qid, Requip 0.25mg  qhs.   Cognitive impairment Continue SNF [facility] for safety, care assistance, continue Donepezil 10mg  qd, continue close supervision for safety.      Family/ staff Communication: plan of care reviewed with the patient and charge nurse.   Labs/tests ordered:  none  Time spend 25 minutes.

## 2019-05-13 NOTE — Assessment & Plan Note (Signed)
frequent falling, f/u neurology, continue Sinemet 25/100mg  II qid, Requip 0.25mg  qhs.

## 2019-05-13 NOTE — Assessment & Plan Note (Signed)
Blood pressure is controlled, continue Lisinopril 10mg  qd, Metoprolol 75mg  qd.

## 2019-05-26 ENCOUNTER — Encounter: Payer: Self-pay | Admitting: Nurse Practitioner

## 2019-05-26 ENCOUNTER — Non-Acute Institutional Stay: Payer: Medicare Other | Admitting: Nurse Practitioner

## 2019-05-26 DIAGNOSIS — G231 Progressive supranuclear ophthalmoplegia [Steele-Richardson-Olszewski]: Secondary | ICD-10-CM

## 2019-05-26 DIAGNOSIS — R296 Repeated falls: Secondary | ICD-10-CM | POA: Diagnosis not present

## 2019-05-26 DIAGNOSIS — R4189 Other symptoms and signs involving cognitive functions and awareness: Secondary | ICD-10-CM | POA: Diagnosis not present

## 2019-05-26 NOTE — Assessment & Plan Note (Signed)
Frequent falls, continue Sinemet 25/100mg  qid, Requip 0.25mg  qd, f/u neurology.

## 2019-05-26 NOTE — Assessment & Plan Note (Addendum)
Lack of safety awareness, unsteady gait are contributory, close supervision for safety, 3 falls in 2 days, update CBC/diff, CMP/eGFR. Observe

## 2019-05-26 NOTE — Assessment & Plan Note (Signed)
Continue SNF [facility] for safety, care assistance, continue Donepezil 10mg qd.  

## 2019-05-26 NOTE — Progress Notes (Signed)
Location:  [facility] Room Number: 4B Place of Service:  SNF (31) Provider:  Marlana Latus NP  [name], MD  Patient Care Team: [name], MD as PCP - General (Internal Medicine) Nachmen Mansel X, NP as Nurse Practitioner (Internal Medicine)  Extended Emergency Contact Information Primary Emergency Contact: Balsam,Frank Address: [place], [place] [address] Johnnette Litter of [place] Phone: [phone] Mobile Phone: [phone] Relation: Brother  Code Status:  DNR Goals of care: Advanced Directive information Advanced Directives 05/26/2019  Does Patient Have a Medical Advance Directive? Yes  Type of Advance Directive Out of facility DNR (pink MOST or yellow form)  Does patient want to make changes to medical advance directive? No - Patient declined  Copy of [place] in Chart? -  Would patient like information on creating a medical advance directive? -  Pre-existing out of facility DNR order (yellow form or pink MOST form) -     Chief Complaint  Patient presents with  . Acute Visit    C/o - Fall    HPI:  Pt is a 63 y.o. male seen today for an acute visit for falls 05/25/19, 1. Around 1:30 pm The patient was found on floor in sitting position. 2. arppimd 10:30 pm, observed the patient sitting upright on the floor beck resting on his bed mattress parallel to the head of the bed. 05/24/19 fall, attempting transferring self from w/c to bed. Hx of progressive supranuclear palsy, on Requip 0.'25mg'$  qd, Sinemet 25/'100mg'$  II qid. He resides in [facility] for safety, care assistance, w/c for mobility, on Donepezil for memory.    Past Medical History:  Diagnosis Date  . Adult onset fluency disorder   . Coronary artery disease    a. s/p stent left anterior descending artery remotely (per 2017 note, "about 4 years" prior). b. Normal nuc 05/2015.  . Diabetes mellitus type 2, controlled ([place]) 01/18/2016  . Dysphagia 2018  .  Gait instability 2018  . History of falling   . Hypercholesterolemia   . Hypertension   . Memory change 2018  . Parkinson's disease ([place]) 2018  . Weight loss 2017   intentional   Past Surgical History:  Procedure Laterality Date  . CAROTID STENT  2012   in left anterior descending artery past EF 55%  . nuclear stress test  06/17/2015   Gated SPECT images reveal normal LV systolic function. EF 55%. Comparing the rest & stress SPECT images, no ischemia. Gated images are normal. Dr. [name], MD [name], [name]     Allergies  Allergen Reactions  . Codeine     Unknown     Outpatient Encounter Medications as of 05/26/2019  Medication Sig  . aspirin 81 MG chewable tablet Chew 81 mg by mouth daily.   [name] atorvastatin (LIPITOR) 40 MG tablet Take 40 mg by mouth at bedtime. \  . carbidopa-levodopa (SINEMET IR) 25-100 MG tablet Take 2 tablets by mouth 4 (four) times daily.   [name] donepezil (ARICEPT) 10 MG tablet Take 10 mg by mouth at bedtime.  [name] lisinopril (PRINIVIL,ZESTRIL) 10 MG tablet Take 10 mg by mouth daily.   . Metoprolol Succinate 50 MG CS24 Take 1 1/2 tabs by mouth once a day.  [name] rOPINIRole (REQUIP) 0.25 MG tablet Take 0.25 mg by mouth at bedtime.   . sennosides-docusate sodium (SENOKOT-S) 8.6-50 MG tablet Take 1 tablet by mouth every other day.  No facility-administered encounter medications on file as of 05/26/2019.    ROS was provided with assistance of staff.  Review of Systems  Constitutional: Negative for activity change, appetite change, chills, diaphoresis, fatigue and fever.  HENT: Positive for hearing loss. Negative for congestion and voice change.   Respiratory: Negative for cough, shortness of breath and wheezing.   Cardiovascular: Negative for chest pain, palpitations and leg swelling.  Gastrointestinal: Negative for abdominal distention, abdominal pain, constipation, diarrhea and vomiting.  Genitourinary: Negative for difficulty urinating, dysuria and urgency.   Musculoskeletal: Positive for gait problem.  Skin: Negative for color change and pallor.  Neurological: Negative for dizziness, speech difficulty, weakness and headaches.       Memory lapses. Slow movement. Difficulty moving eyes.   Psychiatric/Behavioral: Negative for agitation, behavioral problems, hallucinations and sleep disturbance. The patient is not nervous/anxious.     Immunization History  Administered Date(s) Administered  . Influenza Whole 06/20/2018  . Influenza-Unspecified 07/08/2017  . PPD Test 01/15/2017  . Pneumococcal Polysaccharide-23 08/05/2018  . Td 09/17/2016   Pertinent  Health Maintenance Due  Topic Date Due  . COLONOSCOPY  06/03/2004  . OPHTHALMOLOGY EXAM  10/08/2017  . FOOT EXAM  01/21/2018  . INFLUENZA VACCINE  04/18/2019  . HEMOGLOBIN A1C  07/25/2019   Fall Risk  01/21/2017  Falls in the past year? Yes  Number falls in past yr: 2 or more  Injury with Fall? Yes  Risk Factor Category  High Fall Risk  Risk for fall due to : History of fall(s);Impaired balance/gait;Impaired mobility  Follow up Falls evaluation completed   Functional Status Survey:    Vitals:   05/26/19 1500  BP: (!) 158/78  Pulse: 68  Resp: 18  Temp: (!) 97.1 F (36.2 C)  SpO2: 94%  Weight: 229 lb 1.6 oz (103.9 kg)  Height: '5\' 11"'$  (1.803 m)   Body mass index is 31.95 kg/m. Physical Exam Vitals signs and nursing note reviewed.  Constitutional:      General: He is not in acute distress.    Appearance: Normal appearance. He is not ill-appearing, toxic-appearing or diaphoretic.  HENT:     Head: Normocephalic and atraumatic.     Nose: Nose normal.     Mouth/Throat:     Mouth: Mucous membranes are moist.  Eyes:     Extraocular Movements: Extraocular movements intact.     Conjunctiva/sclera: Conjunctivae normal.     Pupils: Pupils are equal, round, and reactive to light.     Comments: Difficulty moving eyes.   Neck:     Musculoskeletal: Normal range of motion and neck  supple.  Cardiovascular:     Rate and Rhythm: Normal rate and regular rhythm.     Heart sounds: No murmur.  Pulmonary:     Breath sounds: No wheezing, rhonchi or rales.  Abdominal:     General: Bowel sounds are normal.     Palpations: Abdomen is soft.     Tenderness: There is no abdominal tenderness. There is no right CVA tenderness, left CVA tenderness or guarding.  Musculoskeletal: Normal range of motion.     Right lower leg: No edema.     Left lower leg: No edema.  Skin:    General: Skin is warm and dry.  Neurological:     General: No focal deficit present.     Mental Status: He is alert. Mental status is at baseline.     Cranial Nerves: No cranial nerve deficit.     Motor: No weakness.  Coordination: Coordination normal.     Gait: Gait abnormal.     Comments: Oriented to person, place. Difficulty moving eyes, slow body movement.   Psychiatric:        Mood and Affect: Mood normal.        Behavior: Behavior normal.     Labs reviewed: Recent Labs    08/03/18 2036 09/15/18 10/05/18 0922 01/22/19 03/27/19  NA 134* 140 138 141 141  K 3.6 4.2 3.8 4.0 4.1  CL 106 104 103  --   --   CO2 20* 25 24  --   --   GLUCOSE 159*  --  122*  --   --   BUN '18 15 12 15 12  '$ CREATININE 1.07 0.7 0.71 0.7 0.8  CALCIUM 9.0 8.8 8.9  --   --    Recent Labs    06/10/18 01/22/19 03/27/19  AST 12* 11* 10*  ALT '18 16 15  '$ ALKPHOS 89  --   --   PROT 6.3  --   --   ALBUMIN 4.1  --   --    Recent Labs    08/03/18 2036 08/05/18 0427 09/15/18 10/05/18 0922 03/27/19  WBC 18.0* 10.2 6.6 6.5  --   NEUTROABS  --   --   --  4.1  --   HGB 14.1 12.8* 12.8* 13.3 13.0*  HCT 46.6 40.7 39* 43.5 40*  MCV 92.6 91.9  --  93.1  --   PLT 275 215 196 217 243   Lab Results  Component Value Date   TSH 2.51 03/27/2019   Lab Results  Component Value Date   HGBA1C 6.1 01/22/2019   Lab Results  Component Value Date   CHOL 95 08/04/2018   HDL 31 (L) 08/04/2018   LDLCALC 47 08/04/2018   TRIG 84  08/04/2018   CHOLHDL 3.1 08/04/2018    Significant Diagnostic Results in last 30 days:  No results found.  Assessment/Plan Recurrent falls Lack of safety awareness, unsteady gait are contributory, close supervision for safety, 3 falls in 2 days, update CBC/diff, CMP/eGFR. Observe   Progressive supranuclear palsy (HCC) Frequent falls, continue Sinemet 25/'100mg'$  qid, Requip 0.'25mg'$  qd, f/u neurology.   Cognitive impairment Continue SNF [facility] for safety, care assistance, continue Donepezil '10mg'$  qd.      Family/ staff Communication: plan of care reviewed with the patient and charge nurse.   Labs/tests ordered:  CBC/diff, CMP/eGFR  Time spend 25 minutes.

## 2019-05-28 LAB — HEPATIC FUNCTION PANEL
ALT: 4 — AB (ref 10–40)
AST: 10 — AB (ref 14–40)
Alkaline Phosphatase: 94 (ref 25–125)
Bilirubin, Total: 0.9

## 2019-05-28 LAB — CBC AND DIFFERENTIAL
HCT: 41 (ref 41–53)
Hemoglobin: 12.9 — AB (ref 13.5–17.5)
Platelets: 207 (ref 150–399)
WBC: 6.4

## 2019-05-28 LAB — BASIC METABOLIC PANEL
BUN: 10 (ref 4–21)
Creatinine: 0.8 (ref 0.6–1.3)
Glucose: 103
Potassium: 4 (ref 3.4–5.3)
Sodium: 141 (ref 137–147)

## 2019-06-04 ENCOUNTER — Encounter: Payer: Self-pay | Admitting: Internal Medicine

## 2019-06-04 ENCOUNTER — Non-Acute Institutional Stay (SKILLED_NURSING_FACILITY): Payer: Medicare Other | Admitting: Internal Medicine

## 2019-06-04 ENCOUNTER — Encounter: Payer: Self-pay | Admitting: *Deleted

## 2019-06-04 DIAGNOSIS — R296 Repeated falls: Secondary | ICD-10-CM | POA: Diagnosis not present

## 2019-06-04 DIAGNOSIS — E119 Type 2 diabetes mellitus without complications: Secondary | ICD-10-CM | POA: Diagnosis not present

## 2019-06-04 DIAGNOSIS — G20 Parkinson's disease: Secondary | ICD-10-CM | POA: Diagnosis not present

## 2019-06-04 DIAGNOSIS — R2681 Unsteadiness on feet: Secondary | ICD-10-CM | POA: Diagnosis not present

## 2019-06-04 DIAGNOSIS — E7800 Pure hypercholesterolemia, unspecified: Secondary | ICD-10-CM

## 2019-06-04 LAB — CHLORIDE
Albumin: 3.7
Calcium: 8.8
Carbon Dioxide, Total: 29
Chloride: 106
EGFR (Non-African Amer.): 94
Globulin: 2.2
Total Protein: 5.9 g/dL

## 2019-06-04 NOTE — Progress Notes (Signed)
Location:  [facility] Room Number: 4B Place of Service:  SNF (31) Provider:  Marlana Latus  NP  [name], MD  Patient Care Team: [name], MD as PCP - General (Internal Medicine) [name], [name], NP as Nurse Practitioner (Internal Medicine)  Extended Emergency Contact Information Primary Emergency Contact: Raybuck,Frank Address: [place], [place] [address] Johnnette Litter of [place] Phone: [phone] Mobile Phone: [phone] Relation: Brother  Code Status:  DNR Goals of care: Advanced Directive information Advanced Directives 05/26/2019  Does Patient Have a Medical Advance Directive? Yes  Type of Advance Directive Out of facility DNR (pink MOST or yellow form)  Does patient want to make changes to medical advance directive? No - Patient declined  Copy of [place] in Chart? -  Would patient like information on creating a medical advance directive? -  Pre-existing out of facility DNR order (yellow form or pink MOST form) -     Chief Complaint  Patient presents with  . Medical Management of Chronic Issues    HPI:  Pt is a 63 y.o. male seen today for medical management of chronic diseases.    Patient has a history of recurrent falls,Parkinson disease diagnosed in 2018,diabetes mellitus, coronary artery disease,hypertension, hyperlipidemia, cognitive impairment  Patient is doing well in the facility his main problem continues to be falls. He is not orthostatic denies any dizziness has been evaluated by therapy.  He just forgets to call for help and use walker and then falls. His CT scan of the head has been negative CT of the spine cervical spine just shows degenerative changes Fortunately patient has not had any falls recently He states now he is trying to tell himself not to get up without help His main complaint today was coughing he thinks is going on for past few weeks with productive sputum.  He  denies any fever chest pain or shortness of breath. His weight is slightly down.  No new nursing issues Past Medical History:  Diagnosis Date  . Adult onset fluency disorder   . Coronary artery disease    a. s/p stent left anterior descending artery remotely (per 2017 note, "about 4 years" prior). b. Normal nuc 05/2015.  . Diabetes mellitus type 2, controlled ([place]) 01/18/2016  . Dysphagia 2018  . Gait instability 2018  . History of falling   . Hypercholesterolemia   . Hypertension   . Memory change 2018  . Parkinson's disease ([place]) 2018  . Weight loss 2017   intentional   Past Surgical History:  Procedure Laterality Date  . CAROTID STENT  2012   in left anterior descending artery past EF 55%  . nuclear stress test  06/17/2015   Gated SPECT images reveal normal LV systolic function. EF 55%. Comparing the rest & stress SPECT images, no ischemia. Gated images are normal. Dr. [name], MD [name], [name]     Allergies  Allergen Reactions  . Codeine     Unknown     Outpatient Encounter Medications as of 06/04/2019  Medication Sig  . aspirin 81 MG chewable tablet Chew 81 mg by mouth daily.   [name] atorvastatin (LIPITOR) 40 MG tablet Take 40 mg by mouth at bedtime. \  . carbidopa-levodopa (SINEMET IR) 25-100 MG tablet Take 2 tablets by mouth 4 (four) times daily.   [name] donepezil (ARICEPT) 10 MG tablet Take 10 mg by mouth at bedtime.  [name]  lisinopril (PRINIVIL,ZESTRIL) 10 MG tablet Take 10 mg by mouth daily.   . Metoprolol Succinate 50 MG CS24 Take 1 1/2 tabs by mouth once a day.  [name]. rOPINIRole (REQUIP) 0.25 MG tablet Take 0.25 mg by mouth at bedtime.   . sennosides-docusate sodium (SENOKOT-S) 8.6-50 MG tablet Take 1 tablet by mouth every other day.    No facility-administered encounter medications on file as of 06/04/2019.     Review of Systems  Constitutional: Negative.   HENT: Negative.   Respiratory: Positive for cough and wheezing.   Cardiovascular: Negative.   Genitourinary:  Negative.   Neurological: Negative.   Psychiatric/Behavioral: Negative.   All other systems reviewed and are negative.   Immunization History  Administered Date(s) Administered  . Influenza Whole 06/20/2018  . Influenza-Unspecified 07/08/2017  . PPD Test 01/15/2017  . Pneumococcal Polysaccharide-23 08/05/2018  . Td 09/17/2016   Pertinent  Health Maintenance Due  Topic Date Due  . COLONOSCOPY  06/03/2004  . OPHTHALMOLOGY EXAM  10/08/2017  . FOOT EXAM  01/21/2018  . INFLUENZA VACCINE  04/18/2019  . HEMOGLOBIN A1C  07/25/2019  . PNA vac Low Risk Adult (1 of 2 - PCV13) 08/06/2019   Fall Risk  01/21/2017  Falls in the past year? Yes  Number falls in past yr: 2 or more  Injury with Fall? Yes  Risk Factor Category  High Fall Risk  Risk for fall due to : History of fall(s);Impaired balance/gait;Impaired mobility  Follow up Falls evaluation completed   Functional Status Survey:    Vitals:   06/04/19 1054  BP: (!) 154/78  Pulse: 60  Temp: (!) 97.1 F (36.2 C)  SpO2: 97%  Weight: 229 lb 1.6 oz (103.9 kg)  Height: 5\' 11"  (1.803 m)   Body mass index is 31.95 kg/m. Physical Exam Vitals signs reviewed.  HENT:     Head: Normocephalic.     Nose: Nose normal.     Mouth/Throat:     Mouth: Mucous membranes are moist.     Pharynx: Oropharynx is clear.  Eyes:     Pupils: Pupils are equal, round, and reactive to light.  Neck:     Musculoskeletal: Neck supple.  Cardiovascular:     Rate and Rhythm: Normal rate and regular rhythm.     Pulses: Normal pulses.  Pulmonary:     Effort: Pulmonary effort is normal.     Comments: Has some Occasional  Wheezing Abdominal:     General: Abdomen is flat. Bowel sounds are normal.     Palpations: Abdomen is soft.  Musculoskeletal:        General: No swelling.  Skin:    General: Skin is warm.  Neurological:     General: No focal deficit present.     Mental Status: He is alert.  Psychiatric:        Mood and Affect: Mood normal.         Thought Content: Thought content normal.        Judgment: Judgment normal.     Labs reviewed: Recent Labs    08/03/18 2036 09/15/18 10/05/18 0922 01/22/19 03/27/19 05/28/19  NA 134* 140 138 141 141 141  K 3.6 4.2 3.8 4.0 4.1 4.0  CL 106 104 103  --   --  106  CO2 20* 25 24  --   --  29  GLUCOSE 159*  --  122*  --   --   --   BUN 18 15 12 15 12 10   CREATININE  1.07 0.7 0.71 0.7 0.8 0.8  CALCIUM 9.0 8.8 8.9  --   --  8.8   Recent Labs    06/10/18 01/22/19 03/27/19 05/28/19  AST 12* 11* 10* 10*  ALT 18 16 15  4*  ALKPHOS 89  --   --  94  PROT 6.3  --   --  5.9  ALBUMIN 4.1  --   --  3.7   Recent Labs    08/03/18 2036 08/05/18 0427 09/15/18 10/05/18 0922 03/27/19 05/28/19  WBC 18.0* 10.2 6.6 6.5  --  6.4  NEUTROABS  --   --   --  4.1  --   --   HGB 14.1 12.8* 12.8* 13.3 13.0* 12.9*  HCT 46.6 40.7 39* 43.5 40* 41  MCV 92.6 91.9  --  93.1  --   --   PLT 275 215 196 217 243 207   Lab Results  Component Value Date   TSH 2.51 03/27/2019   Lab Results  Component Value Date   HGBA1C 6.1 01/22/2019   Lab Results  Component Value Date   CHOL 95 08/04/2018   HDL 31 (L) 08/04/2018   LDLCALC 47 08/04/2018   TRIG 84 08/04/2018   CHOLHDL 3.1 08/04/2018    Significant Diagnostic Results in last 30 days:  No results found.  Assessment/Plan Parkinson's diseasewith ? PSPwith Unsteady Gait and Recurrent Falls Patient was [facility] by neurology in [facility] I do not have the notes and nurses are trying to get the lead Patient has been seen by therapy but he continues to have recurrent falls On Requip and Sinemet Diabetes mellitus Last A1c was 6.1 Not on any meds We will repeat Chronic bronchitis Unfortunately patient does not have insurance which covers any kind of inhalers I talked to pharmacist At this time we will just do duo nebs twice daily for a week and see if it helps him CAD Patient had seen cardiologist in 11/19 when he was admitted with syncope  his echo has  been negative for any acute processes He is on beta-blocker and statin and Aspirin Essential hypertension Trying loose control blood pressure to avoid any postural hypotension due to his recurrent falls  We will continue the same dose of metoprolol and Lisinopril  Hypercholesterolemia Patient due for lipid panel  on statin  Cognitive impairment Continue on Aricept Family/ staff Communication:   Labs/tests ordered:  A1C and Lipid Panel  Total time spent in this patient care encounter was  25_  minutes; greater than 50% of the visit spent counseling patient and staff, reviewing records , Labs and coordinating care for problems addressed at this encounter.

## 2019-06-09 LAB — LIPID PANEL
Cholesterol: 77 (ref 0–200)
HDL: 30 — AB (ref 35–70)
LDL Cholesterol: 28
LDl/HDL Ratio: 2.6
Triglycerides: 103 (ref 40–160)

## 2019-06-09 LAB — HEMOGLOBIN A1C: Hemoglobin A1C: 5.9

## 2019-06-10 ENCOUNTER — Non-Acute Institutional Stay (SKILLED_NURSING_FACILITY): Payer: Medicare Other | Admitting: Nurse Practitioner

## 2019-06-10 ENCOUNTER — Encounter: Payer: Self-pay | Admitting: Nurse Practitioner

## 2019-06-10 DIAGNOSIS — R4189 Other symptoms and signs involving cognitive functions and awareness: Secondary | ICD-10-CM

## 2019-06-10 DIAGNOSIS — R2681 Unsteadiness on feet: Secondary | ICD-10-CM | POA: Diagnosis not present

## 2019-06-10 DIAGNOSIS — G231 Progressive supranuclear ophthalmoplegia [Steele-Richardson-Olszewski]: Secondary | ICD-10-CM

## 2019-06-10 DIAGNOSIS — R296 Repeated falls: Secondary | ICD-10-CM | POA: Diagnosis not present

## 2019-06-10 DIAGNOSIS — S0003XA Contusion of scalp, initial encounter: Secondary | ICD-10-CM | POA: Diagnosis not present

## 2019-06-10 DIAGNOSIS — E7800 Pure hypercholesterolemia, unspecified: Secondary | ICD-10-CM

## 2019-06-10 NOTE — Progress Notes (Signed)
Location:   [facility] Room Number: 14 Place of Service:  SNF (31) Provider: [facility] Kayler Rise NP  Mahlon Gammon, MD  Patient Care Team: Mahlon Gammon, MD as PCP - General (Internal Medicine) Garnie Borchardt X, NP as Nurse Practitioner (Internal Medicine)  Extended Emergency Contact Information Primary Emergency Contact: Wuertz,Frank Address: [address]          [place], [place] [address] [place] of [place] Home Phone: [phone] Mobile Phone: [phone] Relation: Brother  Code Status: DNR Goals of care: Advanced Directive information Advanced Directives 05/26/2019  Does Patient Have a Medical Advance Directive? Yes  Type of Advance Directive Out of facility DNR (pink MOST or yellow form)  Does patient want to make changes to medical advance directive? No - Patient declined  Copy of Healthcare Power of Attorney in Chart? -  Would patient like information on creating a medical advance directive? -  Pre-existing out of facility DNR order (yellow form or pink MOST form) -     Chief Complaint  Patient presents with  . Acute Visit    fall, scalp hematoma    HPI:  Pt is a 63 y.o. male seen today for an acute visit for the patient was found lying on the floor near the entrance to his bedroom, head leaning against the wall. He stated he attempted to transfer self from recliner to bed. An hematoma R parietal region, intact, denied pain upon my visit today. The patient has unsteady gait, needs assistance for transfer. He has poor safety awareness related to dementia. Progressive supra nuclear palsy, f/u neurology, on Sinemet.    Past Medical History:  Diagnosis Date  . Adult onset fluency disorder   . Coronary artery disease    a. s/p stent left anterior descending artery remotely (per 2017 note, "about 4 years" prior). b. Normal nuc 05/2015.  . Diabetes mellitus type 2, controlled (HCC) 01/18/2016  . Dysphagia 2018  . Gait instability 2018  . History of falling   .  Hypercholesterolemia   . Hypertension   . Memory change 2018  . Parkinson's disease (HCC) 2018  . Weight loss 2017   intentional   Past Surgical History:  Procedure Laterality Date  . CAROTID STENT  2012   in left anterior descending artery past EF 55%  . nuclear stress test  06/17/2015   Gated SPECT images reveal normal LV systolic function. EF 55%. Comparing the rest & stress SPECT images, no ischemia. Gated images are normal. Dr. Vernell Barrier, MD [place], [place]     Allergies  Allergen Reactions  . Codeine     Unknown     Allergies as of 06/10/2019      Reactions   Codeine    Unknown       Medication List       Accurate as of June 10, 2019 12:56 PM. If you have any questions, ask your nurse or doctor.        aspirin 81 MG chewable tablet Chew 81 mg by mouth daily.   atorvastatin 40 MG tablet Commonly known as: LIPITOR Take 40 mg by mouth at bedtime. \   carbidopa-levodopa 25-100 MG tablet Commonly known as: SINEMET IR Take 2 tablets by mouth 4 (four) times daily.   donepezil 10 MG tablet Commonly known as: ARICEPT Take 10 mg by mouth at bedtime.   lisinopril 10 MG tablet Commonly known as: ZESTRIL Take 10 mg by mouth daily.   Metoprolol Succinate 50 MG Cs24  Take 1 1/2 tabs by mouth once a day.   rOPINIRole 0.25 MG tablet Commonly known as: REQUIP Take 0.25 mg by mouth at bedtime.   sennosides-docusate sodium 8.6-50 MG tablet Commonly known as: SENOKOT-S Take 1 tablet by mouth every other day.      ROS was provided with assistance of staff Review of Systems  Constitutional: Negative for activity change, appetite change, chills, diaphoresis, fatigue and fever.  HENT: Positive for hearing loss. Negative for congestion and voice change.   Respiratory: Positive for cough. Negative for shortness of breath and wheezing.        Dry cough  Cardiovascular: Negative for chest pain and leg swelling.  Gastrointestinal: Negative for abdominal  distention, abdominal pain, constipation, diarrhea, nausea and vomiting.  Genitourinary: Negative for difficulty urinating, dysuria and urgency.  Musculoskeletal: Positive for gait problem.  Skin: Positive for wound. Negative for color change.       Right parietal region hematoma.   Neurological: Negative for dizziness, facial asymmetry, speech difficulty, weakness and headaches.       Memory lapses.   Psychiatric/Behavioral: Negative for agitation, behavioral problems, hallucinations and sleep disturbance. The patient is not nervous/anxious.     Immunization History  Administered Date(s) Administered  . Influenza Whole 06/20/2018  . Influenza-Unspecified 07/08/2017  . PPD Test 01/15/2017  . Pneumococcal Polysaccharide-23 08/05/2018  . Td 09/17/2016   Pertinent  Health Maintenance Due  Topic Date Due  . COLONOSCOPY  06/03/2004  . OPHTHALMOLOGY EXAM  10/08/2017  . FOOT EXAM  01/21/2018  . INFLUENZA VACCINE  04/18/2019  . HEMOGLOBIN A1C  07/25/2019  . PNA vac Low Risk Adult (1 of 2 - PCV13) 08/06/2019   Fall Risk  01/21/2017  Falls in the past year? Yes  Number falls in past yr: 2 or more  Injury with Fall? Yes  Risk Factor Category  High Fall Risk  Risk for fall due to : History of fall(s);Impaired balance/gait;Impaired mobility  Follow up Falls evaluation completed   Functional Status Survey:    Vitals:   06/10/19 1235  BP: 122/80  Pulse: 68  Resp: 18  Temp: 97.6 F (36.4 C)   There is no height or weight on file to calculate BMI. Physical Exam Vitals signs and nursing note reviewed.  Constitutional:      General: He is not in acute distress.    Appearance: Normal appearance. He is not ill-appearing or diaphoretic.  HENT:     Head: Normocephalic.     Comments: Right parietal region hematoma.      Nose: Nose normal.     Mouth/Throat:     Mouth: Mucous membranes are moist.  Eyes:     Extraocular Movements: Extraocular movements intact.     Conjunctiva/sclera:  Conjunctivae normal.     Pupils: Pupils are equal, round, and reactive to light.     Comments: Difficulty eye movement  Neck:     Musculoskeletal: Normal range of motion and neck supple.  Cardiovascular:     Rate and Rhythm: Normal rate and regular rhythm.     Pulses: Normal pulses.     Heart sounds: Normal heart sounds.  Pulmonary:     Breath sounds: No wheezing, rhonchi or rales.  Abdominal:     General: Bowel sounds are normal. There is no distension.     Palpations: Abdomen is soft.     Tenderness: There is no abdominal tenderness. There is no right CVA tenderness, left CVA tenderness, guarding or rebound.  Musculoskeletal:  Right lower leg: No edema.     Left lower leg: No edema.  Skin:    General: Skin is warm and dry.     Comments: Right parietal region hematoma.    Neurological:     General: No focal deficit present.     Mental Status: He is alert. Mental status is at baseline.     Cranial Nerves: No cranial nerve deficit.     Motor: No weakness.     Coordination: Coordination abnormal.     Gait: Gait abnormal.     Comments: Oriented to person, place. Slow movement.   Psychiatric:        Mood and Affect: Mood normal.     Labs reviewed: Recent Labs    08/03/18 2036  09/15/18 10/05/18 0922 01/22/19 03/27/19 05/28/19  NA 134*   < > 140 138 141 141 141  K 3.6  --  4.2 3.8 4.0 4.1 4.0  CL 106  --  104 103  --   --  106  CO2 20*  --  25 24  --   --  29  GLUCOSE 159*  --   --  122*  --   --   --   BUN 18   < > 15 12 15 12 10   CREATININE 1.07   < > 0.7 0.71 0.7 0.8 0.8  CALCIUM 9.0  --  8.8 8.9  --   --  8.8   < > = values in this interval not displayed.   Recent Labs    01/22/19 03/27/19 05/28/19  AST 11* 10* 10*  ALT 16 15 4*  ALKPHOS  --   --  94  PROT  --   --  5.9  ALBUMIN  --   --  3.7   Recent Labs    08/03/18 2036 08/05/18 0427 09/15/18 10/05/18 0922 03/27/19 05/28/19  WBC 18.0* 10.2 6.6 6.5  --  6.4  NEUTROABS  --   --   --  4.1  --   --    HGB 14.1 12.8* 12.8* 13.3 13.0* 12.9*  HCT 46.6 40.7 39* 43.5 40* 41  MCV 92.6 91.9  --  93.1  --   --   PLT 275 215 196 217 243 207   Lab Results  Component Value Date   TSH 2.51 03/27/2019   Lab Results  Component Value Date   HGBA1C 6.1 01/22/2019   Lab Results  Component Value Date   CHOL 95 08/04/2018   HDL 31 (L) 08/04/2018   LDLCALC 47 08/04/2018   TRIG 84 08/04/2018   CHOLHDL 3.1 08/04/2018    Significant Diagnostic Results in last 30 days:  No results found.  Assessment/Plan: Hematoma of right parietal scalp No s/s of intracranial pressure building up, focal weakness, or change of mentation, continue to observe.   Recurrent falls Due to lack of safety awareness, unsteady gait. Continue SN[facility] for safety, care assistance.   Cognitive impairment Continue Donepezil 10mg  qd for memory, SN[facility] for safety, care assistance.   Unsteady gait Needs assistance for transfer, w/c for mobility.   Progressive supranuclear palsy (HCC) Continue Sinemet 25/100mg  II qid, Requip 0.25mg  qd, f/u Neurology.   Hypercholesterolemia 06/09/19 cholesterol 77, triglycerides 103, LDL 28, HDL 30, Hgb a1c 5.9    Family/ staff Communication: plan of care reviewed with the patient and charge nurse.   Labs/tests ordered:  None  Time spend 25 minutes.

## 2019-06-10 NOTE — Assessment & Plan Note (Signed)
Needs assistance for transfer, w/c for mobility.  

## 2019-06-10 NOTE — Assessment & Plan Note (Addendum)
Continue Sinemet 25/100mg  II qid, Requip 0.25mg  qd, f/u Neurology.

## 2019-06-10 NOTE — Assessment & Plan Note (Signed)
Continue Donepezil 10mg  qd for memory, SN[facility] for safety, care assistance.

## 2019-06-10 NOTE — Assessment & Plan Note (Signed)
No s/s of intracranial pressure building up, focal weakness, or change of mentation, continue to observe.

## 2019-06-10 NOTE — Assessment & Plan Note (Signed)
06/09/19 cholesterol 77, triglycerides 103, LDL 28, HDL 30, Hgb a1c 5.9

## 2019-06-10 NOTE — Assessment & Plan Note (Signed)
Due to lack of safety awareness, unsteady gait. Continue [facility] for safety, care assistance.

## 2019-06-26 ENCOUNTER — Non-Acute Institutional Stay (SKILLED_NURSING_FACILITY): Payer: Medicare Other | Admitting: Nurse Practitioner

## 2019-06-26 ENCOUNTER — Encounter: Payer: Self-pay | Admitting: Nurse Practitioner

## 2019-06-26 DIAGNOSIS — R4189 Other symptoms and signs involving cognitive functions and awareness: Secondary | ICD-10-CM

## 2019-06-26 DIAGNOSIS — R296 Repeated falls: Secondary | ICD-10-CM | POA: Diagnosis not present

## 2019-06-26 DIAGNOSIS — T148XXA Other injury of unspecified body region, initial encounter: Secondary | ICD-10-CM

## 2019-06-26 DIAGNOSIS — R2681 Unsteadiness on feet: Secondary | ICD-10-CM

## 2019-06-26 DIAGNOSIS — G231 Progressive supranuclear ophthalmoplegia [Steele-Richardson-Olszewski]: Secondary | ICD-10-CM

## 2019-06-26 NOTE — Assessment & Plan Note (Signed)
Due to lack of safety awareness related to dementia, unsteady gait/balance related progressive supranuclear palsy and increased frailty. Close supervision for safety. Update CBC/diff, CMP/eGFR

## 2019-06-26 NOTE — Progress Notes (Signed)
Location:   [facility] Room Number: 4B Place of Service:  SNF (31) Provider:  Deziah Renwick NP  [name], MD  Patient Care Team: [name], MD as PCP - General (Internal Medicine) Alora Gorey X, NP as Nurse Practitioner (Internal Medicine)  Extended Emergency Contact Information Primary Emergency Contact: Pawling,Frank Address: [place], [place] [address] [place]te [place] of [place] Phone: [phone] Mobile Phone: [phone] Relation: Brother  Code Status: DNR Goals of care: Advanced Directive information Advanced Directives 06/26/2019  Does Patient Have a Medical Advance Directive? Yes  Type of Advance Directive Out of facility DNR (pink MOST or yellow form)  Does patient want to make changes to medical advance directive? No - Patient declined  Copy of [place] in Chart? -  Would patient like information on creating a medical advance directive? -  Pre-existing out of facility DNR order (yellow form or pink MOST form) Yellow form placed in chart (order not valid for inpatient use)     Chief Complaint  Patient presents with  . Acute Visit    Fall    HPI:  Pt is a 63 y.o. male seen today for an acute visit for the patient sustained bruise right mid abd about his palm size and a golf ball sized bruise at the left flank from fall 06/26/19. The patient was observed getting out of chair and falling to the floor in a seated position. The patient has progressive supranuclear palsy, on Sinemet, Requip,  followed by neurology. He has poor safety awareness, resides in [facility] for safety, care assistance, taking Donepezil 57m qd for memory.    Past Medical History:  Diagnosis Date  . Adult onset fluency disorder   . Coronary artery disease    a. s/p stent left anterior descending artery remotely (per 2017 note, "about 4 years" prior). b. Normal nuc 05/2015.  . Diabetes [place] 2, [place] ([place] 01/18/2016  .  Dysphagia 2018  . Gait instability 2018  . History of falling   . Hypercholesterolemia   . Hypertension   . Memory change 2018  . Parkinson's disease ([place] 2018  . Weight loss 2017   intentional   Past Surgical History:  Procedure Laterality Date  . CAROTID STENT  2012   in left anterior descending artery past EF 55%  . nuclear stress test  06/17/2015   Gated SPECT images reveal normal LV systolic function. EF 55%. Comparing the rest & stress SPECT images, no ischemia. Gated images are normal. Dr. WThedora Hinders [place] [facility] [place]    Allergies  Allergen Reactions  . Codeine     Unknown     Allergies as of 06/26/2019      Reactions   Codeine    Unknown       Medication List       Accurate as of June 26, 2019  2:17 PM. If you have any questions, ask your nurse or doctor.        aspirin 81 MG chewable tablet Chew 81 mg by mouth daily.   atorvastatin 40 MG tablet Commonly known as: LIPITOR Take 40 mg by mouth at bedtime. \   carbidopa-levodopa 25-100 MG tablet Commonly known as: SINEMET IR Take 2 tablets by mouth 4 (four) times daily.   donepezil 10 MG tablet Commonly known as: ARICEPT Take 10 mg by mouth at bedtime.   lisinopril 10 MG tablet  Commonly known as: ZESTRIL Take 10 mg by mouth daily.   Metoprolol Succinate 50 MG Cs24 Take 1 1/2 tabs by mouth once a day.   rOPINIRole 0.25 MG tablet Commonly known as: REQUIP Take 0.25 mg by mouth at bedtime.   sennosides-docusate sodium 8.6-50 MG tablet Commonly known as: SENOKOT-S Take 1 tablet by mouth every other day.      ROS was provided with assistance of staff.  Review of Systems  Constitutional: Negative for activity change, appetite change, chills, diaphoresis, fatigue and fever.  HENT: Positive for hearing loss. Negative for congestion and voice change.   Respiratory: Negative for cough, shortness of breath and wheezing.   Cardiovascular: Negative for chest pain, palpitations and leg swelling.   Gastrointestinal: Negative for abdominal distention, abdominal pain, constipation, diarrhea, nausea and vomiting.  Genitourinary: Negative for difficulty urinating, dysuria and urgency.  Musculoskeletal: Positive for gait problem.  Skin: Positive for color change.  Neurological: Negative for dizziness, speech difficulty, weakness and headaches.       Dementia  Psychiatric/Behavioral: Negative for agitation, behavioral problems, hallucinations and sleep disturbance. The patient is not nervous/anxious.     Immunization History  Administered Date(s) Administered  . Influenza Whole 06/20/2018  . Influenza-Unspecified 07/08/2017  . PPD Test 01/15/2017  . Pneumococcal Polysaccharide-23 08/05/2018  . Td 09/17/2016   Pertinent  Health Maintenance Due  Topic Date Due  . COLONOSCOPY  06/03/2004  . OPHTHALMOLOGY EXAM  10/08/2017  . FOOT EXAM  01/21/2018  . INFLUENZA VACCINE  04/18/2019  . HEMOGLOBIN A1C  07/25/2019  . PNA vac Low Risk Adult (1 of 2 - PCV13) 08/06/2019   Fall Risk  01/21/2017  Falls in the past year? Yes  Number falls in past yr: 2 or more  Injury with Fall? Yes  Risk Factor Category  High Fall Risk  Risk for fall due to : History of fall(s);Impaired balance/gait;Impaired mobility  Follow up Falls evaluation completed   Functional Status Survey:    Vitals:   06/26/19 1104  BP: 122/68  Pulse: 68  Resp: 18  Temp: 97.6 F (36.4 C)  SpO2: 92%  Weight: 231 lb 9.6 oz (105.1 kg)  Height: _0  (1.803 m)   Body mass index is 32.3 kg/m. Physical Exam Vitals signs and nursing note reviewed.  Constitutional:      General: He is not in acute distress.    Appearance: Normal appearance. He is not ill-appearing, toxic-appearing or diaphoretic.  HENT:     Head: Normocephalic and atraumatic.     Nose: Nose normal.     Mouth/Throat:     Mouth: Mucous membranes are moist.  Eyes:     Extraocular Movements: Extraocular movements intact.     Conjunctiva/sclera:  Conjunctivae normal.     Pupils: Pupils are equal, round, and reactive to light.     Comments: Difficulty eye movement.  Neck:     Musculoskeletal: Normal range of motion and neck supple.  Cardiovascular:     Rate and Rhythm: Normal rate and regular rhythm.     Heart sounds: No murmur.  Pulmonary:     Breath sounds: Rhonchi present. No wheezing or rales.  Abdominal:     General: Bowel sounds are normal. There is no distension.     Palpations: Abdomen is soft.     Tenderness: There is no abdominal tenderness. There is no right CVA tenderness, left CVA tenderness, guarding or rebound.  Musculoskeletal:     Right lower leg: No edema.  Left lower leg: No edema.  Skin:    General: Skin is warm and dry.     Findings: Bruising present.     Comments: A palm size bruise right mid abd, a golf ball sized bruise left flank.  Neurological:     General: No focal deficit present.     Mental Status: He is alert. Mental status is at baseline.     Cranial Nerves: No cranial nerve deficit.     Motor: No weakness.     Coordination: Coordination abnormal.     Gait: Gait abnormal.     Comments: Oriented to person, place.  Psychiatric:        Mood and Affect: Mood normal.        Behavior: Behavior normal.     Labs reviewed: Recent Labs    08/03/18 2036  09/15/18 10/05/18 0922 01/22/19 03/27/19 05/28/19  NA 134*   < > 140 138 141 141 141  K 3.6  --  4.2 3.8 4.0 4.1 4.0  CL 106  --  104 103  --   --  106  CO2 20*  --  25 24  --   --  29  GLUCOSE 159*  --   --  122*  --   --   --   BUN 18   < > _0 CREATININE 1.07   < > 0.7 0.71 0.7 0.8 0.8  CALCIUM 9.0  --  8.8 8.9  --   --  8.8   < > = values in this interval not displayed.   Recent Labs    01/22/19 03/27/19 05/28/19  AST 11* 10* 10*  ALT 16 15 4*  ALKPHOS  --   --  94  PROT  --   --  5.9  ALBUMIN  --   --  3.7   Recent Labs    08/03/18 2036 08/05/18 0427 09/15/18 10/05/18 0922 03/27/19 05/28/19  WBC 18.0* 10.2  6.6 6.5  --  6.4  NEUTROABS  --   --   --  4.1  --   --   HGB 14.1 12.8* 12.8* 13.3 13.0* 12.9*  HCT 46.6 40.7 39* 43.5 40* 41  MCV 92.6 91.9  --  93.1  --   --   PLT 275 215 196 217 243 207   Lab Results  Component Value Date   TSH 2.51 03/27/2019   Lab Results  Component Value Date   HGBA1C 5.9 06/09/2019   Lab Results  Component Value Date   CHOL 77 06/09/2019   HDL 30 (A) 06/09/2019   LDLCALC 28 06/09/2019   TRIG 103 06/09/2019   CHOLHDL 3.1 08/04/2018    Significant Diagnostic Results in last 30 days:  No results found.  Assessment/Plan Bruise Right mid abd about his palm size, a golf ball sized bruise left frank, no pain palpated, observe.   Recurrent falls Due to lack of safety awareness related to dementia, unsteady gait/balance related progressive supranuclear palsy and increased frailty. Close supervision for safety. Update CBC/diff, CMP/eGFR  Progressive supranuclear palsy (HCC) Gait and balance problems, continue Sinemet, Requip. Close supervision for safety.   Unsteady gait Encourage, supervise he patient to call for assistance with transfer, safety.   Cognitive impairment Supervision for safety, care assistance, continue Donepezil 40m qd for memory.      Family/ staff Communication: plan of care reviewed with the patient and charge nurse.   Labs/tests ordered:  CBC/diff, CMP/eGFR  Time  spend 25 minutes.

## 2019-06-26 NOTE — Assessment & Plan Note (Signed)
Right mid abd about his palm size, a golf ball sized bruise left frank, no pain palpated, observe.

## 2019-06-26 NOTE — Assessment & Plan Note (Signed)
Supervision for safety, care assistance, continue Donepezil 10mg  qd for memory.

## 2019-06-26 NOTE — Assessment & Plan Note (Addendum)
Encourage, supervise he patient to call for assistance with transfer, safety.

## 2019-06-26 NOTE — Assessment & Plan Note (Signed)
Gait and balance problems, continue Sinemet, Requip. Close supervision for safety.

## 2019-06-30 LAB — BASIC METABOLIC PANEL
BUN: 14 (ref 4–21)
Creatinine: 0.8 (ref 0.6–1.3)
Glucose: 102
Potassium: 4.1 (ref 3.4–5.3)
Sodium: 139 (ref 137–147)

## 2019-06-30 LAB — CBC AND DIFFERENTIAL
HCT: 40 — AB (ref 41–53)
Hemoglobin: 12.9 — AB (ref 13.5–17.5)
Neutrophils Absolute: 3514
Platelets: 209 (ref 150–399)
WBC: 6.4

## 2019-06-30 LAB — HEPATIC FUNCTION PANEL
ALT: 4 — AB (ref 10–40)
AST: 8 — AB (ref 14–40)
Alkaline Phosphatase: 116 (ref 25–125)
Bilirubin, Total: 0.9

## 2019-07-01 ENCOUNTER — Encounter: Payer: Self-pay | Admitting: Nurse Practitioner

## 2019-07-01 ENCOUNTER — Non-Acute Institutional Stay (SKILLED_NURSING_FACILITY): Payer: Medicare Other | Admitting: Nurse Practitioner

## 2019-07-01 DIAGNOSIS — K5900 Constipation, unspecified: Secondary | ICD-10-CM

## 2019-07-01 DIAGNOSIS — I10 Essential (primary) hypertension: Secondary | ICD-10-CM | POA: Diagnosis not present

## 2019-07-01 DIAGNOSIS — R4189 Other symptoms and signs involving cognitive functions and awareness: Secondary | ICD-10-CM

## 2019-07-01 DIAGNOSIS — G231 Progressive supranuclear ophthalmoplegia [Steele-Richardson-Olszewski]: Secondary | ICD-10-CM | POA: Diagnosis not present

## 2019-07-01 NOTE — Assessment & Plan Note (Signed)
Continue [facility] for safety, care assistance, continue Donepezil 10mg qd for memory.  

## 2019-07-01 NOTE — Assessment & Plan Note (Signed)
Blood pressure is controlled, continue Metoprolol 50mg  qd, Lisinopril 10mg  qd, continue ASA 81mg  qd, Atorvastatin 40mg  qd for cardiovascular risk reduction.

## 2019-07-01 NOTE — Progress Notes (Signed)
Location:   [facility] Room Number: 4B Place of Service:  SNF (31) Provider:  Silvester Reierson NP  [name], MD  Patient Care Team: [name], MD as PCP - General (Internal Medicine) Kirsta Probert X, NP as Nurse Practitioner (Internal Medicine)  Extended Emergency Contact Information Primary Emergency Contact: Lanum,Frank Address: [place], [place] [address] Johnnette Litter of [place] Phone: [phone] Mobile Phone: [phone] Relation: Brother  Code Status:  DNR Goals of care: Advanced Directive information Advanced Directives 07/01/2019  Does Patient Have a Medical Advance Directive? Yes  Type of Advance Directive Out of facility DNR (pink MOST or yellow form)  Does patient want to make changes to medical advance directive? No - Patient declined  Copy of [place] in Chart? -  Would patient like information on creating a medical advance directive? -  Pre-existing out of facility DNR order (yellow form or pink MOST form) Yellow form placed in chart (order not valid for inpatient use)     Chief Complaint  Patient presents with  . Medical Management of Chronic Issues  . Health Maintenance    Hep C screening, colonoscopy, eye and foot exam. Influenza vaccine    HPI:  Pt is a 63 y.o. male seen today for medical management of chronic diseases.    The patient has Hx of progressive supranuclear palsy, frequent falling, unsteady gait/balance, f/u neurology, on Sinemet 25/100 II qid, Requip 0.25mg  qd. HTN, blood pressure is controlled on Metoprolol 50mg  qd, Lisinopril 10mg  qd, taking ASAA 81mg  qd, Atorvastatin 40mg  qd for cardiovascular risk reduction. No constipation, taking Senokot S I qod. He takes Donepezil 10mg  qd for memory.    Past Medical History:  Diagnosis Date  . Adult onset fluency disorder   . Coronary artery disease    a. s/p stent left anterior descending artery remotely (per 2017 note, "about 4 years"  prior). b. Normal nuc 05/2015.  . Diabetes mellitus type 2, controlled ([place]) 01/18/2016  . Dysphagia 2018  . Gait instability 2018  . History of falling   . Hypercholesterolemia   . Hypertension   . Memory change 2018  . Parkinson's disease ([place]) 2018  . Weight loss 2017   intentional   Past Surgical History:  Procedure Laterality Date  . CAROTID STENT  2012   in left anterior descending artery past EF 55%  . nuclear stress test  06/17/2015   Gated SPECT images reveal normal LV systolic function. EF 55%. Comparing the rest & stress SPECT images, no ischemia. Gated images are normal. Dr. [name], MD [name], [name]     Allergies  Allergen Reactions  . Codeine     Unknown     Allergies as of 07/01/2019      Reactions   Codeine    Unknown       Medication List       Accurate as of July 01, 2019 11:59 PM. If you have any questions, ask your nurse or doctor.        aspirin 81 MG chewable tablet Chew 81 mg by mouth daily.   atorvastatin 40 MG tablet Commonly known as: LIPITOR Take 40 mg by mouth at bedtime. \   carbidopa-levodopa 25-100 MG tablet Commonly known as: SINEMET IR Take 2 tablets by mouth 4 (four) times daily.   donepezil 10 MG tablet Commonly known as: ARICEPT Take 10 mg by mouth at bedtime.  lisinopril 10 MG tablet Commonly known as: ZESTRIL Take 10 mg by mouth daily.   Metoprolol Succinate 50 MG Cs24 Take 1 1/2 tabs=75 mg by mouth once a day.   rOPINIRole 0.25 MG tablet Commonly known as: REQUIP Take 0.25 mg by mouth at bedtime.   sennosides-docusate sodium 8.6-50 MG tablet Commonly known as: SENOKOT-S Take 1 tablet by mouth every other day.      ROS was provided with assistance of staff.  Review of Systems  Constitutional: Negative for activity change, appetite change, chills, diaphoresis, fatigue and fever.  HENT: Positive for hearing loss. Negative for congestion and voice change.   Respiratory: Negative for cough, shortness  of breath and wheezing.   Cardiovascular: Negative for chest pain, palpitations and leg swelling.  Gastrointestinal: Negative for abdominal distention, abdominal pain, constipation, diarrhea, nausea and vomiting.  Genitourinary: Negative for difficulty urinating, dysuria and urgency.  Musculoskeletal: Positive for gait problem.  Skin: Negative for color change and pallor.  Neurological: Negative for dizziness, speech difficulty, weakness and headaches.       Memory lapses  Psychiatric/Behavioral: Negative for agitation, behavioral problems, hallucinations and sleep disturbance. The patient is not nervous/anxious.     Immunization History  Administered Date(s) Administered  . Influenza Whole 06/20/2018  . Influenza-Unspecified 07/08/2017  . PPD Test 01/15/2017  . Pneumococcal Polysaccharide-23 08/05/2018  . Td 09/17/2016   Pertinent  Health Maintenance Due  Topic Date Due  . COLONOSCOPY  06/03/2004  . OPHTHALMOLOGY EXAM  10/08/2017  . FOOT EXAM  01/21/2018  . INFLUENZA VACCINE  04/18/2019  . PNA vac Low Risk Adult (1 of 2 - PCV13) 08/06/2019  . HEMOGLOBIN A1C  12/07/2019   Fall Risk  01/21/2017  Falls in the past year? Yes  Number falls in past yr: 2 or more  Injury with Fall? Yes  Risk Factor Category  High Fall Risk  Risk for fall due to : History of fall(s);Impaired balance/gait;Impaired mobility  Follow up Falls evaluation completed   Functional Status Survey:    Vitals:   07/01/19 0932  BP: 122/64  Pulse: 61  Resp: 18  Temp: (!) 97.1 F (36.2 C)  SpO2: 94%  Weight: 231 lb 9.6 oz (105.1 kg)  Height:  (1.803 m)   Body mass index is 32.3 kg/m. Physical Exam Vitals signs and nursing note reviewed.  Constitutional:      General: He is not in acute distress.    Appearance: Normal appearance. He is obese. He is not ill-appearing, toxic-appearing or diaphoretic.  HENT:     Head: Normocephalic and atraumatic.     Nose: Nose normal.     Mouth/Throat:      Mouth: Mucous membranes are moist.  Eyes:     Extraocular Movements: Extraocular movements intact.     Conjunctiva/sclera: Conjunctivae normal.     Pupils: Pupils are equal, round, and reactive to light.     Comments: Difficulty of eye movement  Neck:     Musculoskeletal: Normal range of motion and neck supple.  Cardiovascular:     Rate and Rhythm: Normal rate and regular rhythm.     Heart sounds: No murmur.  Pulmonary:     Breath sounds: No wheezing, rhonchi or rales.  Abdominal:     General: Bowel sounds are normal. There is no distension.     Palpations: Abdomen is soft.     Tenderness: There is no abdominal tenderness. There is no right CVA tenderness, left CVA tenderness, guarding or rebound.  Musculoskeletal:  Right lower leg: No edema.     Left lower leg: No edema.  Skin:    General: Skin is warm and dry.  Neurological:     General: No focal deficit present.     Mental Status: He is alert. Mental status is at baseline.     Cranial Nerves: No cranial nerve deficit.     Motor: No weakness.     Coordination: Coordination abnormal.     Gait: Gait abnormal.     Comments: Oriented to person, place.  Psychiatric:        Mood and Affect: Mood normal.        Behavior: Behavior normal.        Thought Content: Thought content normal.     Labs reviewed: Recent Labs    08/03/18 2036 09/15/18 10/05/18 0922  03/27/19 05/28/19 06/30/19  NA 134* 140 138   < > 141 141 139  K 3.6 4.2 3.8   < > 4.1 4.0 4.1  CL 106 104 103  --   --  106  --   CO2 20* 25 24  --   --  29  --   GLUCOSE 159*  --  122*  --   --   --   --   BUN 18 15 12    < > 12 10 14   CREATININE 1.07 0.7 0.71   < > 0.8 0.8 0.8  CALCIUM 9.0 8.8 8.9  --   --  8.8  --    < > = values in this interval not displayed.   Recent Labs    03/27/19 05/28/19 06/30/19  AST 10* 10* 8*  ALT 15 4* 4*  ALKPHOS  --  94 116  PROT  --  5.9  --   ALBUMIN  --  3.7  --    Recent Labs    08/03/18 2036 08/05/18 0427  10/05/18  0922 03/27/19 05/28/19 06/30/19  WBC 18.0* 10.2   < > 6.5  --  6.4 6.4  NEUTROABS  --   --   --  4.1  --   --  3,514  HGB 14.1 12.8*   < > 13.3 13.0* 12.9* 12.9*  HCT 46.6 40.7   < > 43.5 40* 41 40*  MCV 92.6 91.9  --  93.1  --   --   --   PLT 275 215   < > 217 243 207 209   < > = values in this interval not displayed.   Lab Results  Component Value Date   TSH 2.51 03/27/2019   Lab Results  Component Value Date   HGBA1C 5.9 06/09/2019   Lab Results  Component Value Date   CHOL 77 06/09/2019   HDL 30 (A) 06/09/2019   LDLCALC 28 06/09/2019   TRIG 103 06/09/2019   CHOLHDL 3.1 08/04/2018    Significant Diagnostic Results in last 30 days:  No results found.  Assessment/Plan Hypertension Blood pressure is controlled, continue Metoprolol 50mg  qd, Lisinopril 10mg  qd, continue ASA 81mg  qd, Atorvastatin 40mg  qd for cardiovascular risk reduction.   Progressive supranuclear palsy [facility](H[facility] [place] Dr. Cristobal GoldmannLirim Tnuzi, titrate Sinemet as tolerated II qid, RTC in 5 months. SPET brain, absent uptake bilaterally affeting both putamen and caudate nuclei, seen with Parkinson's disease or related syndromes.  Frequent falling, unsteady gait, poor balance, close supervision/assistance for safety, continue Sinemet 25/100mg  II qid.   Constipation Stable, continue Senokot S I qod.   Cognitive  impairment Continue SNF [facility] for safety, care assistance, continue Donepezil 10mg  qd for memory.      Family/ staff Communication: plan of care reviewed with the patient and charge nurse.   Labs/tests ordered: none  Time spend 25 minutes.

## 2019-07-01 NOTE — Assessment & Plan Note (Signed)
Stable, continue Senokot S I qod.

## 2019-07-01 NOTE — Assessment & Plan Note (Addendum)
04/09/19 [place], [facility] [place] Dr. Monia Pouch, titrate Sinemet as tolerated II qid, RTC in 5 months. SPET brain, absent uptake bilaterally affeting both putamen and caudate nuclei, seen with Parkinson's disease or related syndromes.  Frequent falling, unsteady gait, poor balance, close supervision/assistance for safety, continue Sinemet 25/100mg  II qid.

## 2019-07-10 ENCOUNTER — Ambulatory Visit (INDEPENDENT_AMBULATORY_CARE_PROVIDER_SITE_OTHER): Payer: Medicare Other | Admitting: Cardiovascular Disease

## 2019-07-10 ENCOUNTER — Other Ambulatory Visit: Payer: Self-pay

## 2019-07-10 ENCOUNTER — Encounter: Payer: Self-pay | Admitting: Cardiovascular Disease

## 2019-07-10 VITALS — BP 108/67 | HR 61 | Temp 97.3°F | Ht 71.0 in | Wt 231.0 lb

## 2019-07-10 DIAGNOSIS — R296 Repeated falls: Secondary | ICD-10-CM

## 2019-07-10 DIAGNOSIS — I2510 Atherosclerotic heart disease of native coronary artery without angina pectoris: Secondary | ICD-10-CM

## 2019-07-10 DIAGNOSIS — E669 Obesity, unspecified: Secondary | ICD-10-CM

## 2019-07-10 DIAGNOSIS — E785 Hyperlipidemia, unspecified: Secondary | ICD-10-CM

## 2019-07-10 DIAGNOSIS — G20 Parkinson's disease: Secondary | ICD-10-CM

## 2019-07-10 DIAGNOSIS — I10 Essential (primary) hypertension: Secondary | ICD-10-CM

## 2019-07-10 MED ORDER — METOPROLOL SUCCINATE 50 MG PO CS24: 50.0000 mg | EXTENDED_RELEASE_CAPSULE | Freq: Every day | ORAL | 11 refills | Status: DC

## 2019-07-10 MED ORDER — ATORVASTATIN CALCIUM 10 MG PO TABS: 10.0000 mg | ORAL_TABLET | Freq: Every day | ORAL | 11 refills | Status: DC

## 2019-07-10 NOTE — Progress Notes (Signed)
Cardiology Office Note:    Date:  07/11/2019   ID:  Alan Wells, DOB [birth year], MRN [id number]  PCP:  Alan Dad, MD  Cardiologist:  No primary care provider on file.  Electrophysiologist:  None   Referring MD: Wells, Alan X, NP   Chief Complaint  Patient presents with  . Pacemaker Check  . Fall  . Coronary Artery Disease    History of Present Illness:    Alan Wells's brother, Alan Wells, participated by phone in this office visit.  Alan Wells is a 63 y.o. male with a hx of coronary artery disease (coronary stent placed roughly 11 years ago in [facility], probably LAD artery, details unknown), hypertension, hyperlipidemia, Parkinson's disease with very poor mobility and frequent falls.  This is his first cardiology office visit since his hospitalization in November 2019.  I count at least 4 falls required medical attention that have occurred in the past year.  He does not lose consciousness when he falls and does not necessarily complain of dizziness, although it is hard to tease this out with his limited responses on review of systems..  He has very poor mobility and balance related to his neurological problems.  He denies angina or dyspnea at rest or with the mild activity that he can perform.  He does not have orthopnea, PND, edema, claudication, new focal neurological complaints and has not had any serious bleeding problems or head injuries.  He takes aspirin, statin and a beta-blocker as well as a low-dose of an ACE inhibitor.  At her recent office evaluation his blood pressure was relatively low at 122/64 and it is even lower today.  Past Medical History:  Diagnosis Date  . Adult onset fluency disorder   . Coronary artery disease    a. s/p stent left anterior descending artery remotely (per 2017 note, "about 4 years" prior). b. Normal nuc 05/2015.  . Diabetes mellitus type 2, controlled ([place]) 01/18/2016  . Dysphagia 2018  . Gait instability 2018  . History  of falling   . Hypercholesterolemia   . Hypertension   . Memory change 2018  . Parkinson's disease ([place]) 2018  . Weight loss 2017   intentional    Past Surgical History:  Procedure Laterality Date  . CAROTID STENT  2012   in left anterior descending artery past EF 55%  . nuclear stress test  06/17/2015   Gated SPECT images reveal normal LV systolic function. EF 55%. Comparing the rest & stress SPECT images, no ischemia. Gated images are normal. Dr. [name], MD Alan Wells, [name]     Current Medications: Current Meds  Medication Sig  . aspirin 81 MG chewable tablet Chew 81 mg by mouth daily.   Alan Wells atorvastatin (LIPITOR) 10 MG tablet Take 1 tablet (10 mg total) by mouth at bedtime.  . carbidopa-levodopa (SINEMET IR) 25-100 MG tablet Take 2 tablets by mouth 4 (four) times daily.   Alan Wells donepezil (ARICEPT) 10 MG tablet Take 10 mg by mouth at bedtime.  Alan Wells lisinopril (PRINIVIL,ZESTRIL) 10 MG tablet Take 10 mg by mouth daily.   . Metoprolol Succinate 50 MG CS24 Take 50 mg by mouth daily.  Alan Wells rOPINIRole (REQUIP) 0.25 MG tablet Take 0.25 mg by mouth at bedtime.   . sennosides-docusate sodium (SENOKOT-S) 8.6-50 MG tablet Take 1 tablet by mouth every other day.   . [DISCONTINUED] atorvastatin (LIPITOR) 40 MG tablet Take 40 mg by mouth at bedtime. \  . [DISCONTINUED] Metoprolol Succinate 50 MG CS24 Take 1  1/2 tabs=75 mg by mouth once a day.     Allergies:   Codeine   Social History   Socioeconomic History  . Marital status: Single    Spouse name: Not on file  . Number of children: Not on file  . Years of education: Not on file  . Highest education level: Not on file  Occupational History  . 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 Financial resource strain: Not on file  . Food insecurity    Worry: Not on file    Inability: Not on file  . Transportation needs    Medical: Not on file    Non-medical: Not on file  Tobacco Use  . Smoking status: Never Smoker  . Smokeless tobacco:  Never Used  Substance and Sexual Activity  . Alcohol use: No  . Drug use: No  . Sexual activity: Never  Lifestyle  . Physical activity    Days per week: Not on file    Minutes per session: Not on file  . Stress: Not on file  Relationships  . Social Musicianconnections    Talks on phone: Not on file    Gets together: Not on file    Attends religious service: Not on file    Active member of club or organization: Not on file    Attends meetings of clubs or organizations: Not on file    Relationship status: Not on file  Other Topics Concern  . Not on file  Social History Narrative   [place] [place] [facility] [place]   Single   Never smoked   Alcohol none    [employer] minister   Walks with walker     Family History: The patient's family history includes Cancer in his mother; Heart disease in his mother.  ROS:   Please see the history of present illness.     All other systems reviewed and are negative.  EKGs/Labs/Other Studies Reviewed:    The following studies were reviewed today: Echocardiogram November 2019 with normal left ventricular systolic function, normal regional wall motion, no serious  Allergies.  EKG:  EKG is  ordered today.  The ekg ordered today demonstrates normal sinus rhythm, normal tracing other than mild nonspecific T wave changes.  QTc 419 ms  Recent Labs: 03/27/2019: TSH 2.51 06/30/2019: ALT 4; BUN 14; Creatinine 0.8; Hemoglobin 12.9; Platelets 209; Potassium 4.1; Sodium 139  Recent Lipid Panel    Component Value Date/Time   CHOL 77 06/09/2019   TRIG 103 06/09/2019   HDL 30 (A) 06/09/2019   CHOLHDL 3.1 08/04/2018 0559   VLDL 17 08/04/2018 0559   LDLCALC 28 06/09/2019    Physical Exam:    VS:  BP 108/67   Pulse 61   Temp (!) 97.3 F (36.3 C)   Ht 5\' 11"  (1.803 m)   Wt 231 lb (104.8 kg)   SpO2 95%   BMI 32.22 kg/m     Wt Readings from Last 3 Encounters:  07/10/19 231 lb (104.8 kg)  07/01/19 231 lb 9.6 oz (105.1 kg)  06/26/19 231 lb  9.6 oz (105.1 kg)     GEN:  Well nourished, well developed in no acute distress HEENT: Normal NECK: No JVD; No carotid bruits LYMPHATICS: No lymphadenopathy CARDIAC: RRR, no murmurs, rubs, gallops RESPIRATORY:  Clear to auscultation without rales, wheezing or rhonchi  ABDOMEN: Soft, non-tender, non-distended MUSCULOSKELETAL:  No edema; No deformity  SKIN: Warm and dry NEUROLOGIC:  Alert and oriented x 3.  He has significant tardive akinesia and ataxia.  He responds to questions with a little bit of delay in his speech is drawn out.  He prefers monosyllabic answers.  He has difficulty standing up from a sitting position. PSYCHIATRIC:  Normal affect   ASSESSMENT:    1. Coronary artery disease involving native coronary artery of native heart without angina pectoris   2. Essential hypertension   3. Dyslipidemia   4. Mild obesity   5. Parkinson's disease (HCC)   6. Frequent falls    PLAN:    In order of problems listed above:  1. CAD: Asymptomatic.  On aspirin, statin, beta-blocker. 2. HTN: His blood pressure is quite low and he likely has orthostatic hypotension related to his neurological condition.  We will reduce his dose of metoprolol succinate to 50 mg daily.  Asked his nursing facility to send [place] a log of blood pressure readings over the next couple of weeks.  May decide to also cut back on his dose of ACE inhibitor.  I think for him an ideal blood pressure will be around 140/90. 3. HLP: Very recent labs show excellent cholesterol profile follow-up excessive reduction of cholesterol.  To reduce the dose of atorvastatin to 10 mg daily. 4. Mild obesity: Borderline hemoglobin A1c at 5.9% 5. Parkinson's disease and frequent falls   Medication Adjustments/Labs and Tests Ordered: Current medicines are reviewed at length with the patient today.  Concerns regarding medicines are outlined above.  Orders Placed This Encounter  Procedures  . EKG 12-Lead   Meds ordered this encounter   Medications  . Metoprolol Succinate 50 MG CS24    Sig: Take 50 mg by mouth daily.    Dispense:  30 capsule    Refill:  11  . atorvastatin (LIPITOR) 10 MG tablet    Sig: Take 1 tablet (10 mg total) by mouth at bedtime.    Dispense:  30 tablet    Refill:  11    Patient Instructions  Medication Instructions:  DECREASE the Metoprolol to 50 mg once daily DECREASE the Atorvastatin to 10 mg once daily  *If you need a refill on your cardiac medications before your next appointment, please call your pharmacy*  Lab Work: None ordered If you have labs (blood work) drawn today and your tests are completely normal, you will receive your results only by: Alan Wells MyChart Message (if you have MyChart) OR . A paper copy in the mail If you have any lab test that is abnormal or we need to change your treatment, we will call you to review the results.  Testing/Procedures: None ordered  Follow-Up: At [facility], you and your health needs are our priority.  As part of our continuing mission to provide you with exceptional heart care, we have created designated Provider Care Teams.  These Care Teams include your primary Cardiologist (physician) and Advanced Practice Providers (APPs -  Physician Assistants and Nurse Practitioners) who all work together to provide you with the care you need, when you need it.  Your next appointment:   3 months  The format for your next appointment:   Virtual Visit   Provider:   Thurmon Fair, MD     Signed, Thurmon Fair, MD  07/11/2019 4:49 PM    [facility]

## 2019-07-10 NOTE — Patient Instructions (Signed)
Medication Instructions:  DECREASE the Metoprolol to 50 mg once daily DECREASE the Atorvastatin to 10 mg once daily  *If you need a refill on your cardiac medications before your next appointment, please call your pharmacy*  Lab Work: None ordered If you have labs (blood work) drawn today and your tests are completely normal, you will receive your results only by: [name] MyChart Message (if you have MyChart) OR . A paper copy in the mail If you have any lab test that is abnormal or we need to change your treatment, we will call you to review the results.  Testing/Procedures: None ordered  Follow-Up: At [facility], you and your health needs are our priority.  As part of our continuing mission to provide you with exceptional heart care, we have created designated Provider Care Teams.  These Care Teams include your primary Cardiologist (physician) and Advanced Practice Providers (APPs -  Physician Assistants and Nurse Practitioners) who all work together to provide you with the care you need, when you need it.  Your next appointment:   3 months  The format for your next appointment:   Virtual Visit   Provider:   Sanda Klein, MD

## 2019-07-11 ENCOUNTER — Encounter: Payer: Self-pay | Admitting: Cardiovascular Disease

## 2019-07-13 ENCOUNTER — Encounter: Payer: Self-pay | Admitting: Nurse Practitioner

## 2019-07-13 DIAGNOSIS — Z950 Presence of cardiac pacemaker: Secondary | ICD-10-CM | POA: Insufficient documentation

## 2019-07-14 ENCOUNTER — Non-Acute Institutional Stay (SKILLED_NURSING_FACILITY): Payer: Medicare Other | Admitting: Internal Medicine

## 2019-07-14 DIAGNOSIS — J069 Acute upper respiratory infection, unspecified: Secondary | ICD-10-CM

## 2019-07-14 DIAGNOSIS — R296 Repeated falls: Secondary | ICD-10-CM

## 2019-07-14 DIAGNOSIS — E7800 Pure hypercholesterolemia, unspecified: Secondary | ICD-10-CM | POA: Diagnosis not present

## 2019-07-14 DIAGNOSIS — I10 Essential (primary) hypertension: Secondary | ICD-10-CM | POA: Diagnosis not present

## 2019-07-14 NOTE — Progress Notes (Signed)
Location:     Place of Service:     Provider:   Code Status:  Goals of Care:  Advanced Directives 07/01/2019  Does Patient Have a Medical Advance Directive? Yes  Type of Advance Directive Out of facility DNR (pink MOST or yellow form)  Does patient want to make changes to medical advance directive? No - Patient declined  Copy of Healthcare Power of Attorney in Chart? -  Would patient like information on creating a medical advance directive? -  Pre-existing out of facility DNR order (yellow form or pink MOST form) Yellow form placed in chart (order not valid for inpatient use)     Chief Complaint  Patient presents with  . Acute Visit    HPI: Patient is a 63 y.o. male seen today for an acute visit for Dry Cough and Congestion  Patient has a history of recurrent falls,Parkinson disease diagnosed in 2018,diabetes mellitus, coronary artery disease,hypertension, hyperlipidemia, cognitive impairment  Patient was seen today for dry cough which started few days ago with Congestion. No sputum. No Fever or SOB or Wheezing   Past Medical History:  Diagnosis Date  . Adult onset fluency disorder   . Coronary artery disease    a. s/p stent left anterior descending artery remotely (per 2017 note, "about 4 years" prior). b. Normal nuc 05/2015.  . Diabetes mellitus type 2, controlled (HCC) 01/18/2016  . Dysphagia 2018  . Gait instability 2018  . History of falling   . Hypercholesterolemia   . Hypertension   . Memory change 2018  . Parkinson's disease (HCC) 2018  . Weight loss 2017   intentional    Past Surgical History:  Procedure Laterality Date  . CAROTID STENT  2012   in left anterior descending artery past EF 55%  . nuclear stress test  06/17/2015   Gated SPECT images reveal normal LV systolic function. EF 55%. Comparing the rest & stress SPECT images, no ischemia. Gated images are normal. Dr. Vernell Barrier, MD [place], [place]     Allergies  Allergen Reactions  .  Codeine     Unknown     Outpatient Encounter Medications as of 07/14/2019  Medication Sig  . aspirin 81 MG chewable tablet Chew 81 mg by mouth daily.   [name] atorvastatin (LIPITOR) 10 MG tablet Take 1 tablet (10 mg total) by mouth at bedtime.  . carbidopa-levodopa (SINEMET IR) 25-100 MG tablet Take 2 tablets by mouth 4 (four) times daily.   [name] donepezil (ARICEPT) 10 MG tablet Take 10 mg by mouth at bedtime.  [name] lisinopril (PRINIVIL,ZESTRIL) 10 MG tablet Take 10 mg by mouth daily.   . Metoprolol Succinate 50 MG CS24 Take 50 mg by mouth daily.  [name] rOPINIRole (REQUIP) 0.25 MG tablet Take 0.25 mg by mouth at bedtime.   . sennosides-docusate sodium (SENOKOT-S) 8.6-50 MG tablet Take 1 tablet by mouth every other day.    No facility-administered encounter medications on file as of 07/14/2019.     Review of Systems:  Review of Systems  HENT: Positive for congestion, postnasal drip and rhinorrhea.   Respiratory: Negative.   Cardiovascular: Negative.   Genitourinary: Negative.   Musculoskeletal: Negative.   Neurological: Negative.   Psychiatric/Behavioral: Negative.     Health Maintenance  Topic Date Due  . Hepatitis C Screening  1956-09-07  . COLONOSCOPY  06/03/2004  . OPHTHALMOLOGY EXAM  10/08/2017  . FOOT EXAM  01/21/2018  . INFLUENZA VACCINE  04/18/2019  . PNA vac Low Risk Adult (1 of  2 - PCV13) 08/06/2019  . HEMOGLOBIN A1C  12/07/2019  . TETANUS/TDAP  09/17/2026  . HIV Screening  Completed    Physical Exam: Vitals:   07/19/19 1934  BP: (!) 148/76  Pulse: (!) 56  Resp: 18  Temp: 98.2 F (36.8 C)  Weight: 231 lb (104.8 kg)   Body mass index is 32.22 kg/m. Physical Exam Vitals signs reviewed.  Constitutional:      Appearance: Normal appearance.  HENT:     Head: Normocephalic.     Nose: Nose normal.     Comments: No Sinus tenderness     Mouth/Throat:     Mouth: Mucous membranes are moist.     Pharynx: Oropharynx is clear.  Eyes:     Pupils: Pupils are equal, round,  and reactive to light.  Neck:     Musculoskeletal: Neck supple.  Cardiovascular:     Rate and Rhythm: Normal rate and regular rhythm.     Pulses: Normal pulses.  Pulmonary:     Effort: Pulmonary effort is normal.     Breath sounds: Normal breath sounds. No wheezing.  Abdominal:     General: Abdomen is flat. Bowel sounds are normal.     Palpations: Abdomen is soft.  Musculoskeletal:        General: No swelling.  Skin:    General: Skin is warm and dry.  Neurological:     General: No focal deficit present.     Mental Status: He is alert.  Psychiatric:        Mood and Affect: Mood normal.       Labs reviewed: Basic Metabolic Panel: Recent Labs    08/03/18 2036 09/15/18 10/05/18 0922  03/27/19 05/28/19 06/30/19  NA 134* 140 138   < > 141 141 139  K 3.6 4.2 3.8   < > 4.1 4.0 4.1  CL 106 104 103  --   --  106  --   CO2 20* 25 24  --   --  29  --   GLUCOSE 159*  --  122*  --   --   --   --   BUN 18 15 12    < > 12 10 14   CREATININE 1.07 0.7 0.71   < > 0.8 0.8 0.8  CALCIUM 9.0 8.8 8.9  --   --  8.8  --   TSH  --   --   --   --  2.51  --   --    < > = values in this interval not displayed.   Liver Function Tests: Recent Labs    03/27/19 05/28/19 06/30/19  AST 10* 10* 8*  ALT 15 4* 4*  ALKPHOS  --  94 116  PROT  --  5.9  --   ALBUMIN  --  3.7  --    No results for input(s): LIPASE, AMYLASE in the last 8760 hours. No results for input(s): AMMONIA in the last 8760 hours. CBC: Recent Labs    08/03/18 2036 08/05/18 0427  10/05/18 0922 03/27/19 05/28/19 06/30/19  WBC 18.0* 10.2   < > 6.5  --  6.4 6.4  NEUTROABS  --   --   --  4.1  --   --  3,514  HGB 14.1 12.8*   < > 13.3 13.0* 12.9* 12.9*  HCT 46.6 40.7   < > 43.5 40* 41 40*  MCV 92.6 91.9  --  93.1  --   --   --  PLT 275 215   < > 217 243 207 209   < > = values in this interval not displayed.   Lipid Panel: Recent Labs    08/04/18 0559 06/09/19  CHOL 95 77  HDL 31* 30*  LDLCALC 47 28  TRIG 84 103  CHOLHDL  3.1  --    Lab Results  Component Value Date   HGBA1C 5.9 06/09/2019    Procedures since last visit: No results found.  Assessment/Plan  Upper respiratory infection, acute Will start on Mucinex. Has had Wheezing before but none heard today   Essential hypertension Cardiology decreased his Metoprolol dose Loose control of BP due to Recurrent Falls  Hypercholesterolemia Lipitor dose was also reduced due to Low LDL Recurrent falls Has been checked for Postural Hypotension many times but he has been negative Plan to loosely control his BP Falls related to his Parkinson disease   Labs/tests ordered:  * No order type specified * Next appt:  Visit date not found Total time spent in this patient care encounter was  25_  minutes; greater than 50% of the visit spent counseling patient and staff, reviewing records , Labs and coordinating care for problems addressed at this encounter.

## 2019-07-19 ENCOUNTER — Encounter: Payer: Self-pay | Admitting: Internal Medicine

## 2019-07-20 ENCOUNTER — Non-Acute Institutional Stay (SKILLED_NURSING_FACILITY): Payer: Medicare Other | Admitting: Nurse Practitioner

## 2019-07-20 ENCOUNTER — Encounter: Payer: Self-pay | Admitting: Nurse Practitioner

## 2019-07-20 DIAGNOSIS — G231 Progressive supranuclear ophthalmoplegia [Steele-Richardson-Olszewski]: Secondary | ICD-10-CM

## 2019-07-20 DIAGNOSIS — R296 Repeated falls: Secondary | ICD-10-CM | POA: Diagnosis not present

## 2019-07-20 DIAGNOSIS — R2681 Unsteadiness on feet: Secondary | ICD-10-CM

## 2019-07-20 DIAGNOSIS — R4189 Other symptoms and signs involving cognitive functions and awareness: Secondary | ICD-10-CM

## 2019-07-20 DIAGNOSIS — S0081XA Abrasion of other part of head, initial encounter: Secondary | ICD-10-CM | POA: Insufficient documentation

## 2019-07-20 DIAGNOSIS — I10 Essential (primary) hypertension: Secondary | ICD-10-CM

## 2019-07-20 NOTE — Assessment & Plan Note (Signed)
Sustained from fall 07/18/19, no s/s of infection, it should heal.

## 2019-07-20 NOTE — Assessment & Plan Note (Signed)
Continue Donepezil 10mg qd for memory.  

## 2019-07-20 NOTE — Assessment & Plan Note (Signed)
Frequent falling, lack of safety awareness, unsteady gait are contributory, close supervision for safety.

## 2019-07-20 NOTE — Assessment & Plan Note (Signed)
Controlled, continue Metoprolol 50mg  qd, Lisinopril 10mg  qd

## 2019-07-20 NOTE — Assessment & Plan Note (Addendum)
Persisted, encouraged call for assistance for transfer, w/c for mobility.

## 2019-07-20 NOTE — Assessment & Plan Note (Signed)
F/u neurology, continue Requeip 0.25mg  qd, Sinemet 25/100mg  II qid.

## 2019-07-20 NOTE — Progress Notes (Signed)
Location:   [facility] Room Number: 4B Place of Service:  S[facility] (31)[facility]  Mahlon GammonGupta, Anjali L, MD  Patient Care Team: Mahlon GammonGupta, Anjali L, MD as PCP - General (Internal Medicine) Emsley Custer X, NP as Nurse Practitioner (Internal Medicine)  Extended Emergency Contact Information Primary Emergency Contact: Sarff,Frank Address: [address]          [place], [place] [address] [place] Home Phone: [phone] Mobile Phone: [phone] Relation: Brother  Code Status: DNR Goals of care: Advanced Directive information Advanced Directives 07/01/2019  Does Patient Have a Medical Advance Directive? Yes  Type of Advance Directive Out of facility DNR (pink MOST or yellow form)  Does patient want to make changes to medical advance directive? No - Patient declined  Copy of Healthcare Power of Attorney in Chart? -  Would patient like information on creating a medical advance directive? -  Pre-existing out of facility DNR order (yellow form or pink MOST form) Yellow form placed in chart (order not valid for inpatient use)     Chief Complaint  Patient presents with  . Acute Visit    fall, right forehead abrasion    HPI:  Pt is a 63 y.o. male seen today for an acute visit for sustained right forehead abrasion from fall 07/18/19 when the patient was found on floor next to closet. The patient stated he was getting remote control, lost balance. He denied headache, dizziness, chest pain/pressure, palpitation, or focal weakness associated with falling. Hx of Progressive supranuclear palsy, f/u neurology, on Sinemet 25/100mg  II qid, Requip 0.25mg  qd. [facility], care assistance, on Donepezil 10mg  qd for memory. HTN, blood pressure is controlled on Lisinopril 10mg  qd, Metoprolol 50mg  qd.    Past Medical History:  Diagnosis Date  . Adult onset fluency disorder   . Coronary artery disease    a. s/p stent left anterior descending artery  remotely (per 2017 note, "about 4 years" prior). b. Normal nuc 05/2015.  . Diabetes mellitus type 2, controlled (HCC) 01/18/2016  . Dysphagia 2018  . Gait instability 2018  . History of falling   . Hypercholesterolemia   . Hypertension   . Memory change 2018  . Parkinson's disease (HCC) 2018  . Weight loss 2017   intentional   Past Surgical History:  Procedure Laterality Date  . CAROTID STENT  2012   in left anterior descending artery past EF 55%  . nuclear stress test  06/17/2015   Gated SPECT images reveal normal LV systolic function. EF 55%. Comparing the rest & stress SPECT images, no ischemia. Gated images are normal. Dr. Vernell BarrierWaheed Akhtar, MD [place]     Allergies  Allergen Reactions  . Codeine     Unknown     Allergies as of 07/20/2019      Reactions   Codeine    Unknown       Medication List       Accurate as of July 20, 2019 11:59 PM. If you have any questions, ask your nurse or doctor.        aspirin 81 MG chewable tablet Chew 81 mg by mouth daily.   atorvastatin 10 MG tablet Commonly known as: LIPITOR Take 1 tablet (10 mg total) by mouth at bedtime.   carbidopa-levodopa 25-100 MG tablet Commonly known as: SINEMET IR Take 2 tablets by mouth 4 (four) times daily.   donepezil 10 MG tablet Commonly known as: ARICEPT Take  10 mg by mouth at bedtime.   lisinopril 10 MG tablet Commonly known as: ZESTRIL Take 10 mg by mouth daily.   Metoprolol Succinate 50 MG Cs24 Take 50 mg by mouth daily.   rOPINIRole 0.25 MG tablet Commonly known as: REQUIP Take 0.25 mg by mouth at bedtime.   sennosides-docusate sodium 8.6-50 MG tablet Commonly known as: SENOKOT-S Take 1 tablet by mouth every other day.      ROS was provided with assistance of staff.  Review of Systems  Constitutional: Negative for activity change, appetite change, chills, diaphoresis, fatigue and fever.  HENT: Negative for congestion, postnasal drip, rhinorrhea and voice change.    Respiratory: Negative for cough, shortness of breath and wheezing.   Cardiovascular: Negative for chest pain, palpitations and leg swelling.  Gastrointestinal: Negative for abdominal distention, abdominal pain, constipation, diarrhea, nausea and vomiting.  Genitourinary: Negative for difficulty urinating, dysuria and urgency.  Musculoskeletal: Positive for gait problem.  Skin: Positive for wound. Negative for color change.  Neurological: Negative for dizziness, weakness and headaches.       Memory lapses.   Psychiatric/Behavioral: Negative for agitation, behavioral problems, hallucinations and sleep disturbance. The patient is not nervous/anxious.     Immunization History  Administered Date(s) Administered  . Influenza Whole 06/20/2018  . Influenza-Unspecified 07/08/2017  . PPD Test 01/15/2017  . Pneumococcal Polysaccharide-23 08/05/2018  . Td 09/17/2016   Pertinent  Health Maintenance Due  Topic Date Due  . COLONOSCOPY  06/03/2004  . OPHTHALMOLOGY EXAM  10/08/2017  . FOOT EXAM  01/21/2018  . INFLUENZA VACCINE  04/18/2019  . PNA vac Low Risk Adult (1 of 2 - PCV13) 08/06/2019  . HEMOGLOBIN A1C  12/07/2019   Fall Risk  01/21/2017  Falls in the past year? Yes  Number falls in past yr: 2 or more  Injury with Fall? Yes  Risk Factor Category  High Fall Risk  Risk for fall due to : History of fall(s);Impaired balance/gait;Impaired mobility  Follow up Falls evaluation completed   Functional Status Survey:    Vitals:   07/20/19 1339  BP: 120/70  Pulse: (!) 59  Resp: 18  Temp: (!) 97.1 F (36.2 C)   There is no height or weight on file to calculate BMI. Physical Exam Vitals signs and nursing note reviewed.  Constitutional:      General: He is not in acute distress.    Appearance: Normal appearance. He is not ill-appearing, toxic-appearing or diaphoretic.  HENT:     Head: Normocephalic and atraumatic.     Nose: Nose normal.     Mouth/Throat:     Mouth: Mucous membranes  are moist.  Eyes:     Extraocular Movements: Extraocular movements intact.     Conjunctiva/sclera: Conjunctivae normal.     Pupils: Pupils are equal, round, and reactive to light.  Neck:     Musculoskeletal: Normal range of motion and neck supple.  Cardiovascular:     Rate and Rhythm: Normal rate and regular rhythm.     Heart sounds: No murmur.  Pulmonary:     Breath sounds: No wheezing, rhonchi or rales.  Chest:     Chest wall: No tenderness.  Abdominal:     Tenderness: There is no abdominal tenderness. There is no right CVA tenderness, left CVA tenderness, guarding or rebound.  Musculoskeletal:     Right lower leg: No edema.     Left lower leg: No edema.  Skin:    General: Skin is warm and dry.  Comments: Abrasion right forehead.   Neurological:     General: No focal deficit present.     Mental Status: He is alert and oriented to person, place, and time. Mental status is at baseline.  Psychiatric:        Mood and Affect: Mood normal.        Behavior: Behavior normal.        Thought Content: Thought content normal.     Labs reviewed: Recent Labs    08/03/18 2036 09/15/18 10/05/18 0922  03/27/19 05/28/19 06/30/19  NA 134* 140 138   < > 141 141 139  K 3.6 4.2 3.8   < > 4.1 4.0 4.1  CL 106 104 103  --   --  106  --   CO2 20* 25 24  --   --  29  --   GLUCOSE 159*  --  122*  --   --   --   --   BUN 18 15 12    < > 12 10 14   CREATININE 1.07 0.7 0.71   < > 0.8 0.8 0.8  CALCIUM 9.0 8.8 8.9  --   --  8.8  --    < > = values in this interval not displayed.   Recent Labs    03/27/19 05/28/19 06/30/19  AST 10* 10* 8*  ALT 15 4* 4*  ALKPHOS  --  94 116  PROT  --  5.9  --   ALBUMIN  --  3.7  --    Recent Labs    08/03/18 2036 08/05/18 0427  10/05/18 0922 03/27/19 05/28/19 06/30/19  WBC 18.0* 10.2   < > 6.5  --  6.4 6.4  NEUTROABS  --   --   --  4.1  --   --  3,514  HGB 14.1 12.8*   < > 13.3 13.0* 12.9* 12.9*  HCT 46.6 40.7   < > 43.5 40* 41 40*  MCV 92.6 91.9  --   93.1  --   --   --   PLT 275 215   < > 217 243 207 209   < > = values in this interval not displayed.   Lab Results  Component Value Date   TSH 2.51 03/27/2019   Lab Results  Component Value Date   HGBA1C 5.9 06/09/2019   Lab Results  Component Value Date   CHOL 77 06/09/2019   HDL 30 (A) 06/09/2019   LDLCALC 28 06/09/2019   TRIG 103 06/09/2019   CHOLHDL 3.1 08/04/2018    Significant Diagnostic Results in last 30 days:  No results found.  Assessment/Plan: Abrasion of forehead Sustained from fall 07/18/19, no s/s of infection, it should heal.   Unsteady gait Persisted, encouraged call for assistance for transfer, w/c for mobility.   Recurrent falls Frequent falling, lack of safety awareness, unsteady gait are contributory, close supervision for safety.   Progressive supranuclear palsy (HCC) F/u neurology, continue Requeip 0.25mg  qd, Sinemet 25/100mg  II qid.   Hypertension Controlled, continue Metoprolol 50mg  qd, Lisinopril 10mg  qd  Cognitive impairment Continue Donepezil 10mg  qd for memory.     Family/ staff Communication: plan of care reviewed with the patient and charge nurse.   Labs/tests ordered:  None  Time spend 25 minutes.

## 2019-08-05 ENCOUNTER — Non-Acute Institutional Stay (SKILLED_NURSING_FACILITY): Payer: Medicare Other | Admitting: Nurse Practitioner

## 2019-08-05 ENCOUNTER — Encounter: Payer: Self-pay | Admitting: Nurse Practitioner

## 2019-08-05 DIAGNOSIS — R4189 Other symptoms and signs involving cognitive functions and awareness: Secondary | ICD-10-CM

## 2019-08-05 DIAGNOSIS — E119 Type 2 diabetes mellitus without complications: Secondary | ICD-10-CM | POA: Diagnosis not present

## 2019-08-05 DIAGNOSIS — G231 Progressive supranuclear ophthalmoplegia [Steele-Richardson-Olszewski]: Secondary | ICD-10-CM | POA: Diagnosis not present

## 2019-08-05 DIAGNOSIS — I10 Essential (primary) hypertension: Secondary | ICD-10-CM | POA: Diagnosis not present

## 2019-08-05 DIAGNOSIS — R296 Repeated falls: Secondary | ICD-10-CM

## 2019-08-05 NOTE — Assessment & Plan Note (Addendum)
Diet controlled, due for eye/foot exam, referral if desires. Hgb a1c 5.9 06/09/19

## 2019-08-05 NOTE — Assessment & Plan Note (Signed)
Continue SN[facility] for safety, care assistance, frequent falling, close supervision and assistance for safety, continue f/u neurology, continue Requip 0.25mg  qd, Sinemet 25/100mg  II qid.

## 2019-08-05 NOTE — Assessment & Plan Note (Signed)
Continue SN[facility] for safety, care assistance, continue Donepezil for memory.

## 2019-08-05 NOTE — Assessment & Plan Note (Signed)
08/04/19 the resident was in room to get in w/c and fell, no apparent injury.  08/05/19 the patient is lack of safety awareness, he needs assistance for transfer, close supervision and assistance for safety, care assistance.

## 2019-08-05 NOTE — Assessment & Plan Note (Signed)
Sbp in 150s, asymptomatic, continue Metoprolol 50mg  qd, Lisinopril 10mg  qd, ASA 81mg  qd, Lipitor 10mg  qd.

## 2019-08-05 NOTE — Progress Notes (Signed)
Location:   [facility] Room Number: 4 Place of Service:  SNF (31) Provider:  Rehman Levinson NP  Mahlon Gammon, MD  Patient Care Team: Mahlon Gammon, MD as PCP - General (Internal Medicine) Serine Kea X, NP as Nurse Practitioner (Internal Medicine)  Extended Emergency Contact Information Primary Emergency Contact: Milliner,Frank Address: [address]          [place], [place] [address] Darden [place] of [place] Home Phone: [phone] Mobile Phone: [phone] Relation: Brother  Code Status:  DNR Goals of care: Advanced Directive information Advanced Directives 08/05/2019  Does Patient Have a Medical Advance Directive? Yes  Type of Advance Directive Out of facility DNR (pink MOST or yellow form)  Does patient want to make changes to medical advance directive? No - Patient declined  Copy of Healthcare Power of Attorney in Chart? -  Would patient like information on creating a medical advance directive? -  Pre-existing out of facility DNR order (yellow form or pink MOST form) Yellow form placed in chart (order not valid for inpatient use)     Chief Complaint  Patient presents with  . Medical Management of Chronic Issues  . Health Maintenance    Hep C screen. Colonoscopy. Eye and foot exam. Influenza vaccine    HPI:  Pt is a 63 y.o. male seen today for medical management of chronic diseases.    The patient resides in SNF [facility] for safety, care assistance, taking Donepezil 10mg  qd for memory. T2DM, diet controlled, due foot/eye exam. He has frequent falls related to unsteady gait/balance and poor safety awareness. HTN, Sbp in 150s, denied headache, change of vision, chest pain/pressure, or palpitation, on Metoprolol 50mg  qd, Atorvastatin 10mg  qd, ASA 81mg  qd, Lisinopril 10mg  qd. Progressive Supranuclear palsy, f/u neurology, on Requip 0.25mg  qd, Sinemet 25/100mg  II qid.    Past Medical History:  Diagnosis Date  . Adult onset fluency disorder   . Coronary artery disease    a.  s/p stent left anterior descending artery remotely (per 2017 note, "about 4 years" prior). b. Normal nuc 05/2015.  . Diabetes mellitus type 2, controlled (HCC) 01/18/2016  . Dysphagia 2018  . Gait instability 2018  . History of falling   . Hypercholesterolemia   . Hypertension   . Memory change 2018  . Parkinson's disease (HCC) 2018  . Weight loss 2017   intentional   Past Surgical History:  Procedure Laterality Date  . CAROTID STENT  2012   in left anterior descending artery past EF 55%  . nuclear stress test  06/17/2015   Gated SPECT images reveal normal LV systolic function. EF 55%. Comparing the rest & stress SPECT images, no ischemia. Gated images are normal. Dr. 2019, MD 2019, 2018     Allergies  Allergen Reactions  . Codeine     Unknown     Allergies as of 08/05/2019      Reactions   Codeine    Unknown       Medication List       Accurate as of August 05, 2019  2:09 PM. If you have any questions, ask your nurse or doctor.        aspirin 81 MG chewable tablet Chew 81 mg by mouth daily.   atorvastatin 10 MG tablet Commonly known as: LIPITOR Take 1 tablet (10 mg total) by mouth at bedtime.   carbidopa-levodopa 25-100 MG tablet Commonly known as: SINEMET IR Take 2 tablets by mouth 4 (four) times daily.  donepezil 10 MG tablet Commonly known as: ARICEPT Take 10 mg by mouth at bedtime.   lisinopril 10 MG tablet Commonly known as: ZESTRIL Take 10 mg by mouth daily.   Metoprolol Succinate 50 MG Cs24 Take 50 mg by mouth daily.   rOPINIRole 0.25 MG tablet Commonly known as: REQUIP Take 0.25 mg by mouth at bedtime.   sennosides-docusate sodium 8.6-50 MG tablet Commonly known as: SENOKOT-S Take 1 tablet by mouth every other day.       Review of Systems  Constitutional: Negative for activity change, appetite change, chills, diaphoresis, fatigue and fever.  HENT: Positive for hearing loss. Negative for congestion and voice change.    Respiratory: Negative for cough, shortness of breath and wheezing.   Cardiovascular: Negative for chest pain, palpitations and leg swelling.  Gastrointestinal: Negative for abdominal distention, abdominal pain, constipation, diarrhea, nausea and vomiting.  Genitourinary: Negative for difficulty urinating, dysuria, frequency and urgency.  Musculoskeletal: Positive for gait problem.  Skin: Negative for color change and pallor.  Neurological: Negative for dizziness, facial asymmetry, speech difficulty and headaches.       Dementia, moves and speaks slow, difficulty eye movement  Psychiatric/Behavioral: Negative for agitation, behavioral problems, hallucinations and sleep disturbance. The patient is not nervous/anxious.     Immunization History  Administered Date(s) Administered  . Influenza Whole 06/20/2018  . Influenza-Unspecified 07/08/2017  . PPD Test 01/15/2017  . Pneumococcal Polysaccharide-23 08/05/2018  . Td 09/17/2016   Pertinent  Health Maintenance Due  Topic Date Due  . COLONOSCOPY  06/03/2004  . OPHTHALMOLOGY EXAM  10/08/2017  . FOOT EXAM  01/21/2018  . INFLUENZA VACCINE  04/18/2019  . PNA vac Low Risk Adult (1 of 2 - PCV13) 08/06/2019  . HEMOGLOBIN A1C  12/07/2019   Fall Risk  01/21/2017  Falls in the past year? Yes  Number falls in past yr: 2 or more  Injury with Fall? Yes  Risk Factor Category  High Fall Risk  Risk for fall due to : History of fall(s);Impaired balance/gait;Impaired mobility  Follow up Falls evaluation completed   Functional Status Survey:    Vitals:   08/05/19 0851  BP: (!) 152/82  Pulse: (!) 59  Resp: 16  Temp: 97.7 F (36.5 C)  SpO2: 94%  Weight: 231 lb 1.6 oz (104.8 kg)  Height: 5\' 11"  (1.803 m)   Body mass index is 32.23 kg/m. Physical Exam Vitals signs and nursing note reviewed.  Constitutional:      General: He is not in acute distress.    Appearance: Normal appearance. He is obese. He is not ill-appearing, toxic-appearing or  diaphoretic.  HENT:     Head: Normocephalic and atraumatic.     Nose: Nose normal.     Mouth/Throat:     Mouth: Mucous membranes are moist.  Eyes:     Extraocular Movements: Extraocular movements intact.     Conjunctiva/sclera: Conjunctivae normal.     Pupils: Pupils are equal, round, and reactive to light.  Neck:     Musculoskeletal: Normal range of motion.  Cardiovascular:     Rate and Rhythm: Normal rate and regular rhythm.     Heart sounds: No murmur.  Pulmonary:     Breath sounds: No wheezing, rhonchi or rales.  Abdominal:     General: Bowel sounds are normal. There is no distension.     Palpations: Abdomen is soft.     Tenderness: There is no abdominal tenderness. There is no right CVA tenderness, left CVA tenderness, guarding or  rebound.  Musculoskeletal:     Right lower leg: No edema.     Left lower leg: No edema.  Skin:    General: Skin is warm and dry.  Neurological:     General: No focal deficit present.     Mental Status: He is alert. Mental status is at baseline.     Motor: Weakness present.     Coordination: Coordination abnormal.     Gait: Gait abnormal.     Comments: moves and speaks slow, difficulty eye movement, oriented to person, place.    Psychiatric:        Mood and Affect: Mood normal.        Behavior: Behavior normal.     Labs reviewed: Recent Labs    09/15/18 10/05/18 0922  03/27/19 05/28/19 06/30/19  NA 140 138   < > 141 141 139  K 4.2 3.8   < > 4.1 4.0 4.1  CL 104 103  --   --  106  --   CO2 25 24  --   --  29  --   GLUCOSE  --  122*  --   --   --   --   BUN 15 12   < > 12 10 14   CREATININE 0.7 0.71   < > 0.8 0.8 0.8  CALCIUM 8.8 8.9  --   --  8.8  --    < > = values in this interval not displayed.   Recent Labs    03/27/19 05/28/19 06/30/19  AST 10* 10* 8*  ALT 15 4* 4*  ALKPHOS  --  94 116  PROT  --  5.9  --   ALBUMIN  --  3.7  --    Recent Labs    10/05/18 0922 03/27/19 05/28/19 06/30/19  WBC 6.5  --  6.4 6.4  NEUTROABS  4.1  --   --  3,514  HGB 13.3 13.0* 12.9* 12.9*  HCT 43.5 40* 41 40*  MCV 93.1  --   --   --   PLT 217 243 207 209   Lab Results  Component Value Date   TSH 2.51 03/27/2019   Lab Results  Component Value Date   HGBA1C 5.9 06/09/2019   Lab Results  Component Value Date   CHOL 77 06/09/2019   HDL 30 (A) 06/09/2019   LDLCALC 28 06/09/2019   TRIG 103 06/09/2019   CHOLHDL 3.1 08/04/2018    Significant Diagnostic Results in last 30 days:  No results found.  Assessment/Plan Recurrent falls 08/04/19 the resident was in room to get in w/c and fell, no apparent injury.  08/05/19 the patient is lack of safety awareness, he needs assistance for transfer, close supervision and assistance for safety, care assistance.   Hypertension Sbp in 150s, asymptomatic, continue Metoprolol 50mg  qd, Lisinopril 10mg  qd, ASA 81mg  qd, Lipitor 10mg  qd.   Diabetes mellitus type 2, controlled ([place]) Diet controlled, due for eye/foot exam, referral if desires. Hgb a1c 5.9 06/09/19  Progressive supranuclear palsy ([place]) Continue SNF [facility] for safety, care assistance, frequent falling, close supervision and assistance for safety, continue f/u neurology, continue Requip 0.25mg  qd, Sinemet 25/100mg  II qid.   Cognitive impairment Continue SNF [facility] for safety, care assistance, continue Donepezil for memory.      Family/ staff Communication: plan of care reviewed with the patient and charge nurse.  Labs/tests ordered:  None  Time spend 25 minutes.

## 2019-08-12 ENCOUNTER — Encounter: Payer: Self-pay | Admitting: Nurse Practitioner

## 2019-08-12 ENCOUNTER — Non-Acute Institutional Stay (SKILLED_NURSING_FACILITY): Payer: Medicare Other | Admitting: Nurse Practitioner

## 2019-08-12 DIAGNOSIS — R001 Bradycardia, unspecified: Secondary | ICD-10-CM | POA: Diagnosis not present

## 2019-08-12 DIAGNOSIS — R4189 Other symptoms and signs involving cognitive functions and awareness: Secondary | ICD-10-CM

## 2019-08-12 DIAGNOSIS — G231 Progressive supranuclear ophthalmoplegia [Steele-Richardson-Olszewski]: Secondary | ICD-10-CM | POA: Diagnosis not present

## 2019-08-12 DIAGNOSIS — I10 Essential (primary) hypertension: Secondary | ICD-10-CM | POA: Diagnosis not present

## 2019-08-12 NOTE — Assessment & Plan Note (Signed)
Continue SNF [facility] for safety, care assistance, continue Sinemet, Requip

## 2019-08-12 NOTE — Assessment & Plan Note (Signed)
The patient has pace maker, HR 60 bpm today, obtain EKG, will reduce Donepezil to 5mg  qd to eliminate possible cause of bradycardia, may consider reduce Metoprolol if HR trending down.

## 2019-08-12 NOTE — Assessment & Plan Note (Signed)
Blood pressure is controlled, continue Metoprolol 50mg  qd, Lisinopril 10mg  qd.

## 2019-08-12 NOTE — Assessment & Plan Note (Signed)
Will reduce Donepezil 5mg  qd to eliminate possible cause of bradycardia.

## 2019-08-12 NOTE — Progress Notes (Signed)
Location:   [facility] Room Number: 4 Place of Service:  SNF (31) Provider: Amarii Bordas NP Mahlon Gammon, MD  Patient Care Team: Mahlon Gammon, MD as PCP - General (Internal Medicine) Shanequia Kendrick X, NP as Nurse Practitioner (Internal Medicine)  Extended Emergency Contact Information Primary Emergency Contact: Peron,Frank Address: [address]          [place], [place] [address] [place] of [place] Home Phone: [phone] Mobile Phone: [phone] Relation: Brother  Code Status:  DNR Goals of care: Advanced Directive information Advanced Directives 08/05/2019  Does Patient Have a Medical Advance Directive? Yes  Type of Advance Directive Out of facility DNR (pink MOST or yellow form)  Does patient want to make changes to medical advance directive? No - Patient declined  Copy of Healthcare Power of Attorney in Chart? -  Would patient like information on creating a medical advance directive? -  Pre-existing out of facility DNR order (yellow form or pink MOST form) Yellow form placed in chart (order not valid for inpatient use)     Chief Complaint  Patient presents with  . Acute Visit    Slow heart beats    HPI:  Pt is a 63 y.o. male seen today for an acute visit for pharmacy review: the patient's Metoprolol succinate was reduced to 50mg  qd since 07/10/19. Noted HR continue to be frequently <60bpm. If HR continue to be less than 60s, recommended further reducing Metoprolol to reduce the risk of syncope, falling. HTN, blood pressure is controlled on Lisinopril, Metoprolol. The patient resides in SNF [facility] for safety, care assistance, on Donepezil for memory which also can contribute to bradycardia through its vagotonic effects. HR 60 bpm upon my examination today. The patient denied dizziness, headache, change of vision, chest pain/pressure, palpitation or SOB, Progressive supranuclear palsy, unsteady gait, on Sinemet, Requip.    Past Medical History:  Diagnosis Date  .  Adult onset fluency disorder   . Coronary artery disease    a. s/p stent left anterior descending artery remotely (per 2017 note, "about 4 years" prior). b. Normal nuc 05/2015.  . Diabetes mellitus type 2, controlled (HCC) 01/18/2016  . Dysphagia 2018  . Gait instability 2018  . History of falling   . Hypercholesterolemia   . Hypertension   . Memory change 2018  . Parkinson's disease (HCC) 2018  . Weight loss 2017   intentional   Past Surgical History:  Procedure Laterality Date  . CAROTID STENT  2012   in left anterior descending artery past EF 55%  . nuclear stress test  06/17/2015   Gated SPECT images reveal normal LV systolic function. EF 55%. Comparing the rest & stress SPECT images, no ischemia. Gated images are normal. Dr. 06/19/2015, MD Vernell Barrier, Lacy Duverney     Allergies  Allergen Reactions  . Codeine     Unknown     Allergies as of 08/12/2019      Reactions   Codeine    Unknown       Medication List       Accurate as of August 12, 2019  2:01 PM. If you have any questions, ask your nurse or doctor.        aspirin 81 MG chewable tablet Chew 81 mg by mouth daily.   atorvastatin 10 MG tablet Commonly known as: LIPITOR Take 1 tablet (10 mg total) by mouth at bedtime.   carbidopa-levodopa 25-100 MG tablet Commonly known as: SINEMET IR Take 2 tablets by  mouth 4 (four) times daily.   donepezil 10 MG tablet Commonly known as: ARICEPT Take 10 mg by mouth at bedtime.   lisinopril 10 MG tablet Commonly known as: ZESTRIL Take 10 mg by mouth daily.   Metoprolol Succinate 50 MG Cs24 Take 50 mg by mouth daily.   rOPINIRole 0.25 MG tablet Commonly known as: REQUIP Take 0.25 mg by mouth at bedtime.   sennosides-docusate sodium 8.6-50 MG tablet Commonly known as: SENOKOT-S Take 1 tablet by mouth every other day.     ROS was provided with assistance of staff.   Review of Systems  Constitutional: Negative for activity change, appetite change, chills,  diaphoresis, fatigue and fever.  HENT: Negative for congestion, hearing loss and voice change.   Respiratory: Negative for cough, shortness of breath and wheezing.   Cardiovascular: Negative for chest pain, palpitations and leg swelling.  Gastrointestinal: Negative for abdominal distention, abdominal pain, constipation, diarrhea, nausea and vomiting.  Genitourinary: Negative for difficulty urinating, dysuria and urgency.  Musculoskeletal: Positive for gait problem.  Skin: Negative for color change and pallor.  Neurological: Negative for dizziness, tremors, syncope, speech difficulty and headaches.       Dementia  Psychiatric/Behavioral: Negative for agitation, behavioral problems, hallucinations and sleep disturbance. The patient is not nervous/anxious.     Immunization History  Administered Date(s) Administered  . Influenza Whole 06/20/2018  . Influenza-Unspecified 07/08/2017  . PPD Test 01/15/2017  . Pneumococcal Polysaccharide-23 08/05/2018  . Td 09/17/2016   Pertinent  Health Maintenance Due  Topic Date Due  . COLONOSCOPY  06/03/2004  . OPHTHALMOLOGY EXAM  10/08/2017  . FOOT EXAM  01/21/2018  . INFLUENZA VACCINE  04/18/2019  . PNA vac Low Risk Adult (1 of 2 - PCV13) 08/06/2019  . HEMOGLOBIN A1C  12/07/2019   Fall Risk  01/21/2017  Falls in the past year? Yes  Number falls in past yr: 2 or more  Injury with Fall? Yes  Risk Factor Category  High Fall Risk  Risk for fall due to : History of fall(s);Impaired balance/gait;Impaired mobility  Follow up Falls evaluation completed   Functional Status Survey:    Vitals:   08/12/19 0921  BP: 138/68  Pulse: 64  Resp: 18  Temp: 98 F (36.7 C)  SpO2: 93%  Weight: 231 lb 1.6 oz (104.8 kg)  Height: 5\' 11"  (1.803 m)   Body mass index is 32.23 kg/m. Physical Exam Vitals signs and nursing note reviewed.  Constitutional:      General: He is not in acute distress.    Appearance: Normal appearance. He is not ill-appearing,  toxic-appearing or diaphoretic.  HENT:     Head: Normocephalic and atraumatic.     Nose: Nose normal.     Mouth/Throat:     Mouth: Mucous membranes are moist.  Eyes:     Extraocular Movements: Extraocular movements intact.     Conjunctiva/sclera: Conjunctivae normal.     Pupils: Pupils are equal, round, and reactive to light.     Comments: Difficulty eye movement  Cardiovascular:     Rate and Rhythm: Normal rate.     Heart sounds: No murmur.  Pulmonary:     Breath sounds: No wheezing, rhonchi or rales.  Abdominal:     General: Bowel sounds are normal. There is no distension.     Palpations: Abdomen is soft.     Tenderness: There is no abdominal tenderness. There is no right CVA tenderness, left CVA tenderness, guarding or rebound.  Musculoskeletal:  Right lower leg: No edema.     Left lower leg: Edema present.  Skin:    General: Skin is warm and dry.  Neurological:     General: No focal deficit present.     Mental Status: He is alert and oriented to person, place, and time. Mental status is at baseline.     Motor: No weakness.     Coordination: Coordination abnormal.     Gait: Gait abnormal.     Comments: Moves slow  Psychiatric:        Mood and Affect: Mood normal.        Behavior: Behavior normal.        Thought Content: Thought content normal.     Labs reviewed: Recent Labs    09/15/18 10/05/18 0922  03/27/19 05/28/19 06/30/19  NA 140 138   < > 141 141 139  K 4.2 3.8   < > 4.1 4.0 4.1  CL 104 103  --   --  106  --   CO2 25 24  --   --  29  --   GLUCOSE  --  122*  --   --   --   --   BUN 15 12   < > 12 10 14   CREATININE 0.7 0.71   < > 0.8 0.8 0.8  CALCIUM 8.8 8.9  --   --  8.8  --    < > = values in this interval not displayed.   Recent Labs    03/27/19 05/28/19 06/30/19  AST 10* 10* 8*  ALT 15 4* 4*  ALKPHOS  --  94 116  PROT  --  5.9  --   ALBUMIN  --  3.7  --    Recent Labs    10/05/18 0922 03/27/19 05/28/19 06/30/19  WBC 6.5  --  6.4 6.4   NEUTROABS 4.1  --   --  3,514  HGB 13.3 13.0* 12.9* 12.9*  HCT 43.5 40* 41 40*  MCV 93.1  --   --   --   PLT 217 243 207 209   Lab Results  Component Value Date   TSH 2.51 03/27/2019   Lab Results  Component Value Date   HGBA1C 5.9 06/09/2019   Lab Results  Component Value Date   CHOL 77 06/09/2019   HDL 30 (A) 06/09/2019   LDLCALC 28 06/09/2019   TRIG 103 06/09/2019   CHOLHDL 3.1 08/04/2018    Significant Diagnostic Results in last 30 days:  No results found.  Assessment/Plan Bradycardia The patient has pace maker, HR 60 bpm today, obtain EKG, will reduce Donepezil to 5mg  qd to eliminate possible cause of bradycardia, may consider reduce Metoprolol if HR trending down.   Hypertension Blood pressure is controlled, continue Metoprolol 50mg  qd, Lisinopril 10mg  qd.   Progressive supranuclear palsy ([facility]) Continue SNF [facility] for safety, care assistance, continue Sinemet, Requip  Cognitive impairment Will reduce Donepezil 5mg  qd to eliminate possible cause of bradycardia.     Family/ staff Communication: plan of care reviewed with the patient and charge nurse.   Labs/tests ordered:  EKG  Time spend 25 minutes.

## 2019-08-17 ENCOUNTER — Non-Acute Institutional Stay (SKILLED_NURSING_FACILITY): Payer: Medicare Other | Admitting: Nurse Practitioner

## 2019-08-17 ENCOUNTER — Encounter: Payer: Self-pay | Admitting: Nurse Practitioner

## 2019-08-17 DIAGNOSIS — R2681 Unsteadiness on feet: Secondary | ICD-10-CM | POA: Diagnosis not present

## 2019-08-17 DIAGNOSIS — I10 Essential (primary) hypertension: Secondary | ICD-10-CM

## 2019-08-17 DIAGNOSIS — R296 Repeated falls: Secondary | ICD-10-CM

## 2019-08-17 DIAGNOSIS — G231 Progressive supranuclear ophthalmoplegia [Steele-Richardson-Olszewski]: Secondary | ICD-10-CM | POA: Diagnosis not present

## 2019-08-17 DIAGNOSIS — R4189 Other symptoms and signs involving cognitive functions and awareness: Secondary | ICD-10-CM

## 2019-08-17 DIAGNOSIS — R001 Bradycardia, unspecified: Secondary | ICD-10-CM

## 2019-08-17 NOTE — Assessment & Plan Note (Signed)
Impaired gait/balance, continue Sinemet/Requip.

## 2019-08-17 NOTE — Assessment & Plan Note (Signed)
Continue SNF [facility] for safety, care assistance, decrease Donepezil 5mg  qd in setting of eliminating possible cause of bradycardia events.

## 2019-08-17 NOTE — Assessment & Plan Note (Signed)
W/c for mobility °

## 2019-08-17 NOTE — Progress Notes (Signed)
Location:    [facility] Room Number: 14 Place of Service:  SNF (31) Provider: Arna SnipeManXie Tiandre Teall NP  Mahlon GammonGupta, Anjali L, MD  Patient Care Team: Mahlon GammonGupta, Anjali L, MD as PCP - General (Internal Medicine) Shiela Bruns X, NP as Nurse Practitioner (Internal Medicine)  Extended Emergency Contact Information Primary Emergency Contact: Woznick,Frank Address: [address]          [place] [address] [place] Home Phone: [phone] Mobile Phone: [phone] Relation: Brother  Code Status: DNR Goals of care: Advanced Directive information Advanced Directives 08/05/2019  Does Patient Have a Medical Advance Directive? Yes  Type of Advance Directive Out of facility DNR (pink MOST or yellow form)  Does patient want to make changes to medical advance directive? No - Patient declined  Copy of Healthcare Power of Attorney in Chart? -  Would patient like information on creating a medical advance directive? -  Pre-existing out of facility DNR order (yellow form or pink MOST form) Yellow form placed in chart (order not valid for inpatient use)     Chief Complaint  Patient presents with  . Acute Visit    fall    HPI:  Pt is a 63 y.o. male seen today for an acute visit for fall when the patient was found at bedside, no apparent injury. The patient has no recollection of the event. Hx of progressive supranuclear palsy, on Requip/Sinemet,  poor safety awareness, unsteady gait/balance. He takes Donepezil for memory, lisinopril/metoprolol for blood pressure. Bradycardia, HR in 50-60s.    Past Medical History:  Diagnosis Date  . Adult onset fluency disorder   . Coronary artery disease    a. s/p stent left anterior descending artery remotely (per 2017 note, "about 4 years" prior). b. Normal nuc 05/2015.  . Diabetes mellitus type 2, controlled (HCC) 01/18/2016  . Dysphagia 2018  . Gait instability 2018  . History of falling   . Hypercholesterolemia   . Hypertension   . Memory change  2018  . Parkinson's disease (HCC) 2018  . Weight loss 2017   intentional   Past Surgical History:  Procedure Laterality Date  . CAROTID STENT  2012   in left anterior descending artery past EF 55%  . nuclear stress test  06/17/2015   Gated SPECT images reveal normal LV systolic function. EF 55%. Comparing the rest & stress SPECT images, no ischemia. Gated images are normal. Dr. Vernell BarrierWaheed Akhtar, MD [place]     Allergies  Allergen Reactions  . Codeine     Unknown     Allergies as of 08/17/2019      Reactions   Codeine    Unknown       Medication List       Accurate as of August 17, 2019  4:16 PM. If you have any questions, ask your nurse or doctor.        aspirin 81 MG chewable tablet Chew 81 mg by mouth daily.   atorvastatin 10 MG tablet Commonly known as: LIPITOR Take 1 tablet (10 mg total) by mouth at bedtime.   carbidopa-levodopa 25-100 MG tablet Commonly known as: SINEMET IR Take 2 tablets by mouth 4 (four) times daily.   donepezil 10 MG tablet Commonly known as: ARICEPT Take 10 mg by mouth at bedtime.   lisinopril 10 MG tablet Commonly known as: ZESTRIL Take 10 mg by mouth daily.   Metoprolol Succinate 50 MG Cs24 Take 50 mg by mouth daily.   rOPINIRole 0.25 MG tablet  Commonly known as: REQUIP Take 0.25 mg by mouth at bedtime.   sennosides-docusate sodium 8.6-50 MG tablet Commonly known as: SENOKOT-S Take 1 tablet by mouth every other day.      ROS was provided with assistance of staff.  Review of Systems  Constitutional: Negative for activity change, appetite change, chills, diaphoresis, fatigue and fever.  HENT: Positive for hearing loss and trouble swallowing. Negative for congestion.   Eyes: Negative for visual disturbance.  Respiratory: Negative for cough, shortness of breath and wheezing.   Cardiovascular: Positive for leg swelling. Negative for chest pain and palpitations.  Gastrointestinal: Negative for abdominal distention,  abdominal pain, constipation, diarrhea, nausea and vomiting.  Genitourinary: Positive for frequency. Negative for difficulty urinating, dysuria and urgency.       Nocturnal urination 2-3x/night, not new.   Musculoskeletal: Positive for gait problem.  Skin: Negative for color change and pallor.  Neurological: Negative for dizziness, facial asymmetry, speech difficulty, weakness and headaches.       Memory lapses.   Psychiatric/Behavioral: Positive for sleep disturbance. Negative for agitation, behavioral problems and hallucinations. The patient is not nervous/anxious.        Difficulty returning to sleep after bathroom trips at night.     Immunization History  Administered Date(s) Administered  . Influenza Whole 06/20/2018  . Influenza-Unspecified 07/08/2017  . PPD Test 01/15/2017  . Pneumococcal Polysaccharide-23 08/05/2018  . Td 09/17/2016   Pertinent  Health Maintenance Due  Topic Date Due  . COLONOSCOPY  06/03/2004  . OPHTHALMOLOGY EXAM  10/08/2017  . FOOT EXAM  01/21/2018  . INFLUENZA VACCINE  04/18/2019  . PNA vac Low Risk Adult (1 of 2 - PCV13) 08/06/2019  . HEMOGLOBIN A1C  12/07/2019   Fall Risk  01/21/2017  Falls in the past year? Yes  Number falls in past yr: 2 or more  Injury with Fall? Yes  Risk Factor Category  High Fall Risk  Risk for fall due to : History of fall(s);Impaired balance/gait;Impaired mobility  Follow up Falls evaluation completed   Functional Status Survey:    Vitals:   08/17/19 1555  BP: (!) 142/76  Pulse: 68  Resp: 18  Temp: 98.1 F (36.7 C)  SpO2: 96%   There is no height or weight on file to calculate BMI. Physical Exam Constitutional:      General: He is not in acute distress.    Appearance: Normal appearance. He is not ill-appearing, toxic-appearing or diaphoretic.  HENT:     Head: Normocephalic and atraumatic.     Nose: Nose normal.     Mouth/Throat:     Mouth: Mucous membranes are moist.  Eyes:     Extraocular Movements:  Extraocular movements intact.     Conjunctiva/sclera: Conjunctivae normal.     Pupils: Pupils are equal, round, and reactive to light.     Comments: Difficulty eye movement.   Neck:     Musculoskeletal: Normal range of motion and neck supple.  Cardiovascular:     Rate and Rhythm: Normal rate and regular rhythm.     Heart sounds: No murmur.  Pulmonary:     Breath sounds: No wheezing, rhonchi or rales.  Abdominal:     General: Bowel sounds are normal. There is no distension.     Palpations: Abdomen is soft.     Tenderness: There is no abdominal tenderness. There is no right CVA tenderness, guarding or rebound.  Musculoskeletal:     Right lower leg: Edema present.     Left lower  leg: Edema present.     Comments: Trace edema BLE.   Skin:    General: Skin is warm and dry.  Neurological:     General: No focal deficit present.     Mental Status: He is alert and oriented to person, place, and time. Mental status is at baseline.     Motor: No weakness.     Coordination: Coordination abnormal.     Gait: Gait abnormal.     Comments: Moves slow  Psychiatric:        Mood and Affect: Mood normal.        Behavior: Behavior normal.        Thought Content: Thought content normal.     Labs reviewed: Recent Labs    09/15/18 10/05/18 0922  03/27/19 05/28/19 06/30/19  NA 140 138   < > 141 141 139  K 4.2 3.8   < > 4.1 4.0 4.1  CL 104 103  --   --  106  --   CO2 25 24  --   --  29  --   GLUCOSE  --  122*  --   --   --   --   BUN 15 12   < > 12 10 14   CREATININE 0.7 0.71   < > 0.8 0.8 0.8  CALCIUM 8.8 8.9  --   --  8.8  --    < > = values in this interval not displayed.   Recent Labs    03/27/19 05/28/19 06/30/19  AST 10* 10* 8*  ALT 15 4* 4*  ALKPHOS  --  94 116  PROT  --  5.9  --   ALBUMIN  --  3.7  --    Recent Labs    10/05/18 0922 03/27/19 05/28/19 06/30/19  WBC 6.5  --  6.4 6.4  NEUTROABS 4.1  --   --  3,514  HGB 13.3 13.0* 12.9* 12.9*  HCT 43.5 40* 41 40*  MCV 93.1  --    --   --   PLT 217 243 207 209   Lab Results  Component Value Date   TSH 2.51 03/27/2019   Lab Results  Component Value Date   HGBA1C 5.9 06/09/2019   Lab Results  Component Value Date   CHOL 77 06/09/2019   HDL 30 (A) 06/09/2019   LDLCALC 28 06/09/2019   TRIG 103 06/09/2019   CHOLHDL 3.1 08/04/2018    Significant Diagnostic Results in last 30 days:  No results found.  Assessment/Plan: Recurrent falls Lack of safety awareness, unsteady gait/impaired balance are contributory, continue close supervision/assistance for safety.   Unsteady gait W/c for mobility.   Progressive supranuclear palsy (HCC) Impaired gait/balance, continue Sinemet/Requip.   Hypertension Blood pressure is controlled, continue Metoprolol, Lisinopril.   Cognitive impairment Continue [facility] for safety, care assistance, decrease Donepezil 5mg  qd in setting of eliminating possible cause of bradycardia events.   Bradycardia Will request EKG from cardiology, continue Metoprolol for now, HR in 50-60s.    Family/ staff Communication: plan of care reviewed with the patient and charge nurse.   Labs/tests ordered: none  Time spend 25 minutes.

## 2019-08-17 NOTE — Assessment & Plan Note (Signed)
Will request EKG from cardiology, continue Metoprolol for now, HR in 50-60s.

## 2019-08-17 NOTE — Assessment & Plan Note (Signed)
Blood pressure is controlled, continue Metoprolol, Lisinopril.  

## 2019-08-17 NOTE — Assessment & Plan Note (Signed)
Lack of safety awareness, unsteady gait/impaired balance are contributory, continue close supervision/assistance for safety.

## 2019-08-27 ENCOUNTER — Non-Acute Institutional Stay (SKILLED_NURSING_FACILITY): Payer: Medicare Other | Admitting: Internal Medicine

## 2019-08-27 ENCOUNTER — Encounter: Payer: Self-pay | Admitting: Internal Medicine

## 2019-08-27 DIAGNOSIS — R2681 Unsteadiness on feet: Secondary | ICD-10-CM

## 2019-08-27 DIAGNOSIS — I10 Essential (primary) hypertension: Secondary | ICD-10-CM

## 2019-08-27 DIAGNOSIS — R296 Repeated falls: Secondary | ICD-10-CM

## 2019-08-27 DIAGNOSIS — G231 Progressive supranuclear ophthalmoplegia [Steele-Richardson-Olszewski]: Secondary | ICD-10-CM | POA: Diagnosis not present

## 2019-08-27 NOTE — Progress Notes (Signed)
Location:   [facility] Room Number: 4 Place of Service:  SNF [phone]) Provider: [name], MD  [name], MD  Patient Care Team: [name], MD as PCP - General (Internal Medicine) [name], [name], NP as Nurse Practitioner (Internal Medicine)  Extended Emergency Contact Information Primary Emergency Contact: Shawgo,Frank Address: [place], [place] [address] [place] Phone: [phone] Mobile Phone: [phone] Relation: Brother  Code Status:  DNR Goals of care: Advanced Directive information Advanced Directives 08/05/2019  Does Patient Have a Medical Advance Directive? Yes  Type of Advance Directive Out of facility DNR (pink MOST or yellow form)  Does patient want to make changes to medical advance directive? No - Patient declined  Copy of [place] in Chart? -  Would patient like information on creating a medical advance directive? -  Pre-existing out of facility DNR order (yellow form or pink MOST form) Yellow form placed in chart (order not valid for inpatient use)     Chief Complaint  Patient presents with  . Acute Visit    Fall    HPI:  Pt is a 63 y.o. male seen today for an acute visit for Recurrent Falls. He had another fall yesterday when he sustain small Scalp tear in his head  Patient has a history of recurrent falls,Parkinson disease diagnosed in 2018,diabetes mellitus, coronary artery disease,hypertension, hyperlipidemia, cognitive impairment  Patient has history of recurrent falls due to his Neurodegenerative disorder with Parkinson and PSP.  He follows with neurology in [facility]. Patient fell again yesterday as he was trying to get up from his chair.  He sustained abrasion on his head. Patient denies any dizziness.  His cough is much better now on Mucinex no other issues  Past Medical History:  Diagnosis Date  . Adult onset fluency disorder   . Coronary  artery disease    a. s/p stent left anterior descending artery remotely (per 2017 note, "about 4 years" prior). b. Normal nuc 05/2015.  . Diabetes mellitus type 2, controlled ([place]) 01/18/2016  . Dysphagia 2018  . Gait instability 2018  . History of falling   . Hypercholesterolemia   . Hypertension   . Memory change 2018  . Parkinson's disease ([place]) 2018  . Weight loss 2017   intentional   Past Surgical History:  Procedure Laterality Date  . CAROTID STENT  2012   in left anterior descending artery past EF 55%  . nuclear stress test  06/17/2015   Gated SPECT images reveal normal LV systolic function. EF 55%. Comparing the rest & stress SPECT images, no ischemia. Gated images are normal. Dr. [name], MD [name], [name]     Allergies  Allergen Reactions  . Codeine     Unknown     Allergies as of 08/27/2019      Reactions   Codeine    Unknown       Medication List       Accurate as of August 27, 2019 11:08 AM. If you have any questions, ask your nurse or doctor.        aspirin 81 MG chewable tablet Chew 81 mg by mouth daily.   atorvastatin 10 MG tablet Commonly known as: LIPITOR Take 1 tablet (10 mg total) by mouth at bedtime.   carbidopa-levodopa 25-100 MG tablet Commonly known as: SINEMET IR Take 2 tablets by mouth 4 (four) times  daily.   donepezil 5 MG disintegrating tablet Commonly known as: ARICEPT ODT Take 5 mg by mouth at bedtime.   guaiFENesin 600 MG 12 hr tablet Commonly known as: MUCINEX Take 600 mg by mouth 2 (two) times daily.   lisinopril 10 MG tablet Commonly known as: ZESTRIL Take 10 mg by mouth daily.   loratadine 10 MG tablet Commonly known as: CLARITIN Take 10 mg by mouth daily.   Metoprolol Succinate 50 MG Cs24 Take 50 mg by mouth daily.   rOPINIRole 0.25 MG tablet Commonly known as: REQUIP Take 0.25 mg by mouth at bedtime.   sennosides-docusate sodium 8.6-50 MG tablet Commonly known as: SENOKOT-S Take 1 tablet by mouth  every other day.       Review of Systems  Review of Systems  Constitutional: Negative for activity change, appetite change, chills, diaphoresis, fatigue and fever.  HENT: Negative for mouth sores, postnasal drip, rhinorrhea, sinus pain and sore throat.   Respiratory: Negative for apnea, cough, chest tightness, shortness of breath and wheezing.   Cardiovascular: Negative for chest pain, palpitations and leg swelling.  Gastrointestinal: Negative for abdominal distention, abdominal pain, constipation, diarrhea, nausea and vomiting.  Genitourinary: Negative for dysuria and frequency.  Musculoskeletal: Negative for arthralgias, joint swelling and myalgias.  Skin: Negative for rash.  Neurological: Negative for dizziness, syncope, weakness, light-headedness and numbness.  Psychiatric/Behavioral: Negative for behavioral problems, confusion and sleep disturbance.     Immunization History  Administered Date(s) Administered  . Influenza Whole 06/20/2018  . Influenza-Unspecified 07/08/2017  . PPD Test 01/15/2017  . Pneumococcal Polysaccharide-23 08/05/2018  . Td 09/17/2016   Pertinent  Health Maintenance Due  Topic Date Due  . COLONOSCOPY  06/03/2004  . OPHTHALMOLOGY EXAM  10/08/2017  . FOOT EXAM  01/21/2018  . INFLUENZA VACCINE  04/18/2019  . PNA vac Low Risk Adult (1 of 2 - PCV13) 08/06/2019  . HEMOGLOBIN A1C  12/07/2019   Fall Risk  01/21/2017  Falls in the past year? Yes  Number falls in past yr: 2 or more  Injury with Fall? Yes  Risk Factor Category  High Fall Risk  Risk for fall due to : History of fall(s);Impaired balance/gait;Impaired mobility  Follow up Falls evaluation completed   Functional Status Survey:    Vitals:   08/27/19 1105  BP: 130/66  Pulse: 61  Resp: 18  Temp: 98.1 F (36.7 C)  SpO2: 96%  Weight: 230 lb 6.4 oz (104.5 kg)  Height: 5\' 11"  (1.803 m)   Body mass index is 32.13 kg/m. Physical Exam  Constitutional: Oriented to person, place, and time.  Well-developed and well-nourished.  HENT: Has Small Abrasion on top of his head Head: Normocephalic.  Mouth/Throat: Oropharynx is clear and moist.  Eyes: Pupils are equal, round, and reactive to light.  Neck: Neck supple.  Cardiovascular: Normal rate and normal heart sounds.  No murmur heard. Pulmonary/Chest: Effort normal and breath sounds normal. No respiratory distress. No wheezes. She has no rales.  Abdominal: Soft. Bowel sounds are normal. No distension. There is no tenderness. There is no rebound.  Musculoskeletal: No edema.  Lymphadenopathy: none Neurological: Alert and oriented to person, place, and time.  Skin: Skin is warm and dry.  Psychiatric: Normal mood and affect. Behavior is normal. Thought content normal.    Labs reviewed: Recent Labs    09/15/18 0000 09/15/18 0000 10/05/18 0922 03/27/19 0000 05/28/19 0000 06/30/19 0000  NA 140   < > 138 141 141 139  K 4.2  --  3.8 4.1 4.0 4.1  CL 104  --  103  --  106  --   CO2 25  --  24  --  29  --   GLUCOSE  --   --  122*  --   --   --   BUN 15   < > 12 12 10 14   CREATININE 0.7   < > 0.71 0.8 0.8 0.8  CALCIUM 8.8  --  8.9  --  8.8  --    < > = values in this interval not displayed.   Recent Labs    03/27/19 0000 05/28/19 0000 06/30/19 0000  AST 10* 10* 8*  ALT 15 4* 4*  ALKPHOS  --  94 116  PROT  --  5.9  --   ALBUMIN  --  3.7  --    Recent Labs    10/05/18 0922 03/27/19 0000 05/28/19 0000 06/30/19 0000  WBC 6.5  --  6.4 6.4  NEUTROABS 4.1  --   --  3,514  HGB 13.3 13.0* 12.9* 12.9*  HCT 43.5 40* 41 40*  MCV 93.1  --   --   --   PLT 217 243 207 209   Lab Results  Component Value Date   TSH 2.51 03/27/2019   Lab Results  Component Value Date   HGBA1C 5.9 06/09/2019   Lab Results  Component Value Date   CHOL 77 06/09/2019   HDL 30 (A) 06/09/2019   LDLCALC 28 06/09/2019   TRIG 103 06/09/2019   CHOLHDL 3.1 08/04/2018    Significant Diagnostic Results in last 30 days:  No results found.   Assessment/Plan  Parkinson's diseasewith ? PSPwith Unsteady Gait and Recurrent Falls Has been seen by neurologist and therapy Does not much which can be done for his falls Will go ahead and decrease his Tenormin to 25 mg daily as he does have some bradycardia and see if that helps  Essential hypertension Reduce the dose of metoprolol to 25 Continue on lisinopril Was seen by cardiology recently  Diabetes mellitus Last A1c was 5.9 Not on any meds Chronic bronchitis Unfortunately patient does not have insurance which covers any kind of inhalers. Continue Mucinex PRN. Was not wheezing today  Family/ staff Communication:   Labs/tests ordered:

## 2019-09-07 ENCOUNTER — Encounter: Payer: Self-pay | Admitting: Nurse Practitioner

## 2019-09-07 ENCOUNTER — Non-Acute Institutional Stay (SKILLED_NURSING_FACILITY): Payer: Medicare Other | Admitting: Nurse Practitioner

## 2019-09-07 DIAGNOSIS — R4189 Other symptoms and signs involving cognitive functions and awareness: Secondary | ICD-10-CM | POA: Diagnosis not present

## 2019-09-07 DIAGNOSIS — I10 Essential (primary) hypertension: Secondary | ICD-10-CM | POA: Diagnosis not present

## 2019-09-07 DIAGNOSIS — G231 Progressive supranuclear ophthalmoplegia [Steele-Richardson-Olszewski]: Secondary | ICD-10-CM

## 2019-09-07 DIAGNOSIS — S0003XA Contusion of scalp, initial encounter: Secondary | ICD-10-CM

## 2019-09-07 DIAGNOSIS — R296 Repeated falls: Secondary | ICD-10-CM | POA: Diagnosis not present

## 2019-09-07 NOTE — Progress Notes (Signed)
[facility]    [facility] Room Number: 4 Place of Service:  SNF (31) Provider:  Marvetta Vohs NP  Mahlon GammonGupta, Anjali L, MD  Patient Care Team: Mahlon GammonGupta, Anjali L, MD as PCP - General (Internal Medicine) Brysun Eschmann X, NP as Nurse Practitioner (Internal Medicine)  Extended Emergency Contact Information Primary Emergency Contact: Rohlman,Frank Address: [address]          [place], [place] [address] [place] Home Phone: [phone] Mobile Phone: [phone] Relation: Brother  Code Status:  DNR Goals of care: Advanced Directive information Advanced Directives 08/05/2019  Does Patient Have a Medical Advance Directive? Yes  Type of Advance Directive Out of facility DNR (pink MOST or yellow form)  Does patient want to make changes to medical advance directive? No - Patient declined  Copy of Healthcare Power of Attorney in Chart? -  Would patient like information on creating a medical advance directive? -  Pre-existing out of facility DNR order (yellow form or pink MOST form) Yellow form placed in chart (order not valid for inpatient use)     Chief Complaint  Patient presents with  . Acute Visit    Fall    HPI:  Pt is a 63 y.o. male seen today for an acute visit for fall 09/07/19, Fall 09/07/19, landed back of head on sink in bathroom, resulted contusion of the lower occiput, bleeding stopped by pressure dressing. Hx of dementia, on Donepezil 5mg  every day for memory. Progressive supranuclear palsy, managed with Requip 0.25mg  every day, Sinemet 25/100mg  II qid. HTN, loose controlled, on Lisinopril 10mg  every day, Metoprolol 25mg  every day.    Past Medical History:  Diagnosis Date  . Adult onset fluency disorder   . Coronary artery disease    a. s/p stent left anterior descending artery remotely (per 2017 note, "about 4 years" prior). b. Normal nuc 05/2015.  . Diabetes mellitus type 2, controlled (HCC) 01/18/2016  . Dysphagia 2018  . Gait instability  2018  . History of falling   . Hypercholesterolemia   . Hypertension   . Memory change 2018  . Parkinson's disease (HCC) 2018  . Weight loss 2017   intentional   Past Surgical History:  Procedure Laterality Date  . CAROTID STENT  2012   in left anterior descending artery past EF 55%  . nuclear stress test  06/17/2015   Gated SPECT images reveal normal LV systolic function. EF 55%. Comparing the rest & stress SPECT images, no ischemia. Gated images are normal. Dr. Vernell BarrierWaheed Akhtar, MD [place]     Allergies  Allergen Reactions  . Codeine     Unknown     Allergies as of 09/07/2019      Reactions   Codeine    Unknown       Medication List       Accurate as of September 07, 2019 11:59 PM. If you have any questions, ask your nurse or doctor.        STOP taking these medications   guaiFENesin 600 MG 12 hr tablet Commonly known as: MUCINEX Stopped by: Ferol Laiche X Soleil Mas, NP   loratadine 10 MG tablet Commonly known as: CLARITIN Stopped by: Jakyri Brunkhorst X Telisa Ohlsen, NP     TAKE these medications   aspirin 81 MG chewable tablet Chew 81 mg by mouth daily.   atorvastatin 10 MG tablet Commonly known as: LIPITOR Take 1 tablet (10 mg total) by mouth at bedtime.   carbidopa-levodopa 25-100 MG tablet  Commonly known as: SINEMET IR Take 2 tablets by mouth 4 (four) times daily.   donepezil 5 MG disintegrating tablet Commonly known as: ARICEPT ODT Take 5 mg by mouth at bedtime.   lisinopril 10 MG tablet Commonly known as: ZESTRIL Take 10 mg by mouth daily.   Metoprolol Succinate 25 MG Cs24 Take 25 mg by mouth daily. What changed: Another medication with the same name was removed. Continue taking this medication, and follow the directions you see here. Changed by: Leisel Pinette X Alexandros Ewan, NP   rOPINIRole 0.25 MG tablet Commonly known as: REQUIP Take 0.25 mg by mouth at bedtime.   sennosides-docusate sodium 8.6-50 MG tablet Commonly known as: SENOKOT-S Take 1 tablet by mouth every other day.       ROS was provided with assistance of staff.  Review of Systems  Constitutional: Negative for activity change, appetite change, chills, diaphoresis, fatigue and fever.  HENT: Positive for hearing loss. Negative for congestion and voice change.   Respiratory: Negative for cough, shortness of breath and wheezing.   Cardiovascular: Positive for leg swelling. Negative for chest pain and palpitations.  Gastrointestinal: Negative for abdominal distention, abdominal pain, constipation, diarrhea, nausea and vomiting.  Genitourinary: Negative for difficulty urinating, dysuria and urgency.  Musculoskeletal: Positive for gait problem.  Skin: Positive for wound.       Lower back of head.   Neurological: Negative for dizziness, speech difficulty, weakness and headaches.       Memory lapses.   Psychiatric/Behavioral: Negative for agitation, behavioral problems, hallucinations and sleep disturbance. The patient is not nervous/anxious.     Immunization History  Administered Date(s) Administered  . Influenza Whole 06/20/2018  . Influenza-Unspecified 07/08/2017  . PPD Test 01/15/2017  . Pneumococcal Polysaccharide-23 08/05/2018  . Td 09/17/2016   Pertinent  Health Maintenance Due  Topic Date Due  . COLONOSCOPY  06/03/2004  . OPHTHALMOLOGY EXAM  10/08/2017  . FOOT EXAM  01/21/2018  . INFLUENZA VACCINE  04/18/2019  . PNA vac Low Risk Adult (1 of 2 - PCV13) 08/06/2019  . HEMOGLOBIN A1C  12/07/2019   Fall Risk  01/21/2017  Falls in the past year? Yes  Number falls in past yr: 2 or more  Injury with Fall? Yes  Risk Factor Category  High Fall Risk  Risk for fall due to : History of fall(s);Impaired balance/gait;Impaired mobility  Follow up Falls evaluation completed   Functional Status Survey:    Vitals:   09/07/19 1422  BP: (!) 158/90  Pulse: 66  Resp: 18  Temp: (!) 97.5 F (36.4 C)  SpO2: 96%  Weight: 230 lb 6.4 oz (104.5 kg)  Height:  (1.803 m)   Body mass index is 32.13  kg/m. Physical Exam Vitals and nursing note reviewed.  Constitutional:      General: He is not in acute distress.    Appearance: Normal appearance. He is not ill-appearing, toxic-appearing or diaphoretic.  HENT:     Head: Normocephalic and atraumatic.     Nose: Nose normal.     Mouth/Throat:     Mouth: Mucous membranes are moist.  Eyes:     Extraocular Movements: Extraocular movements intact.     Conjunctiva/sclera: Conjunctivae normal.     Pupils: Pupils are equal, round, and reactive to light.     Comments: Difficulty eye movement.   Cardiovascular:     Rate and Rhythm: Normal rate and regular rhythm.     Heart sounds: No murmur.  Pulmonary:     Breath sounds:  No wheezing, rhonchi or rales.  Abdominal:     General: Bowel sounds are normal. There is no distension.     Palpations: Abdomen is soft.     Tenderness: There is no abdominal tenderness. There is no right CVA tenderness, left CVA tenderness, guarding or rebound.  Musculoskeletal:     Cervical back: Normal range of motion and neck supple.     Right lower leg: Edema present.     Left lower leg: Edema present.     Comments: Trace edema BLE  Skin:    General: Skin is warm and dry.     Comments: Contusion lower occiput, bleeding stopped by pressure dressing.   Neurological:     General: No focal deficit present.     Mental Status: He is alert and oriented to person, place, and time. Mental status is at baseline.     Motor: No weakness.     Coordination: Coordination normal.     Gait: Gait abnormal.  Psychiatric:        Mood and Affect: Mood normal.        Behavior: Behavior normal.        Thought Content: Thought content normal.     Labs reviewed: Recent Labs    09/15/18 0000 09/15/18 0000 10/05/18 0922 03/27/19 0000 05/28/19 0000 06/30/19 0000  NA 140   < > 138 141 141 139  K 4.2  --  3.8 4.1 4.0 4.1  CL 104  --  103  --  106  --   CO2 25  --  24  --  29  --   GLUCOSE  --   --  122*  --   --   --     BUN 15   < > 12 12 10 14   CREATININE 0.7   < > 0.71 0.8 0.8 0.8  CALCIUM 8.8  --  8.9  --  8.8  --    < > = values in this interval not displayed.   Recent Labs    03/27/19 0000 05/28/19 0000 06/30/19 0000  AST 10* 10* 8*  ALT 15 4* 4*  ALKPHOS  --  94 116  PROT  --  5.9  --   ALBUMIN  --  3.7  --    Recent Labs    10/05/18 0922 03/27/19 0000 05/28/19 0000 06/30/19 0000  WBC 6.5  --  6.4 6.4  NEUTROABS 4.1  --   --  3,514  HGB 13.3 13.0* 12.9* 12.9*  HCT 43.5 40* 41 40*  MCV 93.1  --   --   --   PLT 217 243 207 209   Lab Results  Component Value Date   TSH 2.51 03/27/2019   Lab Results  Component Value Date   HGBA1C 5.9 06/09/2019   Lab Results  Component Value Date   CHOL 77 06/09/2019   HDL 30 (A) 06/09/2019   LDLCALC 28 06/09/2019   TRIG 103 06/09/2019   CHOLHDL 3.1 08/04/2018    Significant Diagnostic Results in last 30 days:  No results found.  Assessment/Plan Recurrent falls Fall 09/07/19, landed back of head on sink in bathroom, resulted contusion of the lower occiput, bleeding stopped by pressure dressing. The patient is lack of safety awareness, not calling for assistance with transfer, unsteady on his feet due to progressive supranuclear palsy. Close supervision for safety.   Scalp contusion Lower occiput, bleeding stopped by pressure dressing, it should heal, monitor for acute intracranial process.  Cognitive impairment The patient needs close supervision, frequent reminders for ADLs, continue Donepezil 5mg  every day.   Hypertension Blood pressure is controlled, continue Metoprolol, Lisinopril.   Progressive supranuclear palsy (HCC) Frequent falling, unsteady gait/balance, cognitive impairment, continue [facility] for safety, care assistance, continue Requip, Sinemet.      Family/ staff Communication: plan of care reviewed with the patient and charge nurse.   Labs/tests ordered:  none  Time spend 25 minutes.

## 2019-09-08 ENCOUNTER — Encounter: Payer: Self-pay | Admitting: Nurse Practitioner

## 2019-09-08 NOTE — Assessment & Plan Note (Signed)
Blood pressure is controlled, continue Metoprolol, Lisinopril.  

## 2019-09-08 NOTE — Assessment & Plan Note (Signed)
Lower occiput, bleeding stopped by pressure dressing, it should heal, monitor for acute intracranial process.

## 2019-09-08 NOTE — Assessment & Plan Note (Signed)
Fall 09/07/19, landed back of head on sink in bathroom, resulted contusion of the lower occiput, bleeding stopped by pressure dressing. The patient is lack of safety awareness, not calling for assistance with transfer, unsteady on his feet due to progressive supranuclear palsy. Close supervision for safety.

## 2019-09-08 NOTE — Assessment & Plan Note (Signed)
Frequent falling, unsteady gait/balance, cognitive impairment, continue [facility] for safety, care assistance, continue Requip, Sinemet.

## 2019-09-08 NOTE — Assessment & Plan Note (Signed)
The patient needs close supervision, frequent reminders for ADLs, continue Donepezil 5mg  every day.

## 2019-09-15 ENCOUNTER — Non-Acute Institutional Stay (SKILLED_NURSING_FACILITY): Payer: Medicare Other | Admitting: Internal Medicine

## 2019-09-15 ENCOUNTER — Encounter: Payer: Self-pay | Admitting: Internal Medicine

## 2019-09-15 DIAGNOSIS — R4189 Other symptoms and signs involving cognitive functions and awareness: Secondary | ICD-10-CM

## 2019-09-15 DIAGNOSIS — R2681 Unsteadiness on feet: Secondary | ICD-10-CM | POA: Diagnosis not present

## 2019-09-15 DIAGNOSIS — I10 Essential (primary) hypertension: Secondary | ICD-10-CM | POA: Diagnosis not present

## 2019-09-15 DIAGNOSIS — E119 Type 2 diabetes mellitus without complications: Secondary | ICD-10-CM

## 2019-09-15 DIAGNOSIS — G231 Progressive supranuclear ophthalmoplegia [Steele-Richardson-Olszewski]: Secondary | ICD-10-CM | POA: Diagnosis not present

## 2019-09-15 DIAGNOSIS — I2510 Atherosclerotic heart disease of native coronary artery without angina pectoris: Secondary | ICD-10-CM

## 2019-09-15 NOTE — Progress Notes (Signed)
[facility] Room Number: 4 Place of Service:  SNF (31)  Provider:   Code Status:  Goals of Care:  Advanced Directives 09/15/2019  Does Patient Have a Medical Advance Directive? Yes  Type of Advance Directive Out of facility DNR (pink MOST or yellow form)  Does patient want to make changes to medical advance directive? No - Patient declined  Copy of Healthcare Power of Attorney in Chart? -  Would patient like information on creating a medical advance directive? -  Pre-existing out of facility DNR order (yellow form or pink MOST form) Yellow form placed in chart (order not valid for inpatient use)     Chief Complaint  Patient presents with  . Medical Management of Chronic Issues  . Health Maintenance    Hep C screen, Colonoscopy, foot and eye exam and PCV13    HPI: Patient is a 63 y.o. male seen today for medical management of chronic diseases.    Patient has a history of recurrent falls,Parkinson disease diagnosed in 2018,diabetes mellitus, coronary artery disease,hypertension, hyperlipidemia, cognitive impairment  He is Long term Resident of facility Continues to have Falls due to his PSP and  Parkinson with Poor Awareness. Has seen Therapy many times.  No Dizziness. Beta blocker has been decreased in Dose with No Change in his Falling Dis not have any Complains today. No Coughing. No Congestion Appetite is good Weight is stable  Past Medical History:  Diagnosis Date  . Adult onset fluency disorder   . Coronary artery disease    a. s/p stent left anterior descending artery remotely (per 2017 note, "about 4 years" prior). b. Normal nuc 05/2015.  . Diabetes mellitus type 2, controlled (HCC) 01/18/2016  . Dysphagia 2018  . Gait instability 2018  . History of falling   . Hypercholesterolemia   . Hypertension   . Memory change 2018  . Parkinson's disease (HCC) 2018  . Weight loss 2017   intentional    Past Surgical History:    Procedure Laterality Date  . CAROTID STENT  2012   in left anterior descending artery past EF 55%  . nuclear stress test  06/17/2015   Gated SPECT images reveal normal LV systolic function. EF 55%. Comparing the rest & stress SPECT images, no ischemia. Gated images are normal. Dr. Vernell BarrierWaheed Akhtar, MD [place]     Allergies  Allergen Reactions  . Codeine     Unknown     Outpatient Encounter Medications as of 09/15/2019  Medication Sig  . aspirin 81 MG chewable tablet Chew 81 mg by mouth daily.   [name]. atorvastatin (LIPITOR) 10 MG tablet Take 1 tablet (10 mg total) by mouth at bedtime.  . carbidopa-levodopa (SINEMET IR) 25-100 MG tablet Take 2 tablets by mouth 4 (four) times daily.   [name]. donepezil (ARICEPT ODT) 5 MG disintegrating tablet Take 5 mg by mouth at bedtime.   [name]. lisinopril (PRINIVIL,ZESTRIL) 10 MG tablet Take 10 mg by mouth daily.   . Metoprolol Succinate 25 MG CS24 Take 25 mg by mouth daily.  [name]. rOPINIRole (REQUIP) 0.25 MG tablet Take 0.25 mg by mouth at bedtime.   . sennosides-docusate sodium (SENOKOT-S) 8.6-50 MG tablet Take 1 tablet by mouth every other day.    No facility-administered encounter medications on file as of 09/15/2019.    Review of Systems:  Review of Systems  Review of Systems  Constitutional: Negative for activity change, appetite change, chills, diaphoresis, fatigue and fever.  HENT: Negative  for mouth sores, postnasal drip, rhinorrhea, sinus pain and sore throat.   Respiratory: Negative for apnea, cough, chest tightness, shortness of breath and wheezing.   Cardiovascular: Negative for chest pain, palpitations and leg swelling.  Gastrointestinal: Negative for abdominal distention, abdominal pain, constipation, diarrhea, nausea and vomiting.  Genitourinary: Negative for dysuria and frequency.  Musculoskeletal: Negative for arthralgias, joint swelling and myalgias.  Skin: Negative for rash.  Neurological: Negative for dizziness, syncope, weakness,  light-headedness and numbness.  Psychiatric/Behavioral: Negative for behavioral problems, confusion and sleep disturbance.     Health Maintenance  Topic Date Due  . Hepatitis C Screening  07/24/1956  . COLONOSCOPY  06/03/2004  . OPHTHALMOLOGY EXAM  10/08/2017  . FOOT EXAM  01/21/2018  . PNA vac Low Risk Adult (1 of 2 - PCV13) 08/06/2019  . HEMOGLOBIN A1C  12/07/2019  . TETANUS/TDAP  09/17/2026  . INFLUENZA VACCINE  Completed  . HIV Screening  Completed    Physical Exam: Vitals:   09/15/19 1137  BP: 130/78  Pulse: 62  Resp: 18  Temp: (!) 97.1 F (36.2 C)  SpO2: 95%  Weight: 230 lb 6.4 oz (104.5 kg)  Height: 5\' 11"  (1.803 m)   Body mass index is 32.13 kg/m. Physical Exam  Constitutional: Oriented to person, place, and time. Well-developed and well-nourished.  HENT:  Head: Normocephalic.  Mouth/Throat: Oropharynx is clear and moist.  Eyes: Pupils are equal, round, and reactive to light.  Neck: Neck supple.  Cardiovascular: Normal rate and normal heart sounds.  No murmur heard. Pulmonary/Chest: Effort normal and breath sounds normal. No respiratory distress. No wheezes. She has no rales.  Abdominal: Soft. Bowel sounds are normal. No distension. There is no tenderness. There is no rebound.  Musculoskeletal: No edema.  Lymphadenopathy: none Neurological: Alert and oriented to person, place, and time.  Skin: Skin is warm and dry.  Psychiatric: Normal mood and affect. Behavior is normal. Thought content normal.    Labs reviewed: Basic Metabolic Panel: Recent Labs    10/05/18 0922 03/27/19 0000 05/28/19 0000 06/30/19 0000  NA 138 141 141 139  K 3.8 4.1 4.0 4.1  CL 103  --  106  --   CO2 24  --  29  --   GLUCOSE 122*  --   --   --   BUN 12 12 10 14   CREATININE 0.71 0.8 0.8 0.8  CALCIUM 8.9  --  8.8  --   TSH  --  2.51  --   --    Liver Function Tests: Recent Labs    03/27/19 0000 05/28/19 0000 06/30/19 0000  AST 10* 10* 8*  ALT 15 4* 4*  ALKPHOS  --   94 116  PROT  --  5.9  --   ALBUMIN  --  3.7  --    No results for input(s): LIPASE, AMYLASE in the last 8760 hours. No results for input(s): AMMONIA in the last 8760 hours. CBC: Recent Labs    10/05/18 0922 03/27/19 0000 05/28/19 0000 06/30/19 0000  WBC 6.5  --  6.4 6.4  NEUTROABS 4.1  --   --  3,514  HGB 13.3 13.0* 12.9* 12.9*  HCT 43.5 40* 41 40*  MCV 93.1  --   --   --   PLT 217 243 207 209   Lipid Panel: Recent Labs    06/09/19 0000  CHOL 77  HDL 30*  LDLCALC 28  TRIG 07/02/19   Lab Results  Component Value Date  HGBA1C 5.9 06/09/2019    Procedures since last visit: No results found.  Assessment/Plan  Progressive supranuclear palsy (HCC) Continues to follow with Neurology On Sinemet  Essential hypertension Controlled on Lisinopril and Metoprolol  Loose Control due to his Falls  Cognitive impairment On Aricept and Supportive Care  Unsteady gait Continues to have falls with injuries  Has seen therapy . Poor awareness of his risk Controlled type 2 diabetes mellitus without complication, without long-term current use of insulin (HCC) Last A1 C was 5.9  CAD On Aspirin, Statin and Beta blocker Hyperlipidemia Dose was reduced for Low LDL Routine Screening Has never had Screening Colonoscopy per Patient  Will d/w his Brother  Labs/tests ordered: HBA1C, BMP,CBC, Lipid Panel in 4 weeks  Total time spent in this patient care encounter was  25_  minutes; greater than 50% of the visit spent counseling patient and staff, reviewing records , Labs and coordinating care for problems addressed at this encounter.

## 2019-09-15 NOTE — Progress Notes (Signed)
Location:   [facility] [place] Room Number: 4 Place of Service:  SNF [phone]) Provider: [name], MD  [name], MD  Patient Care Team: [name], MD as PCP - General (Internal Medicine) [name], [name], NP as Nurse Practitioner (Internal Medicine)  Extended Emergency Contact Information Primary Emergency Contact: Rodick,Frank Address: [place], [place] [address] [place] Phone: [phone] Mobile Phone: [phone] Relation: Brother  Code Status:  DNR Goals of care: Advanced Directive information Advanced Directives 09/15/2019  Does Patient Have a Medical Advance Directive? Yes  Type of Advance Directive Out of facility DNR (pink MOST or yellow form)  Does patient want to make changes to medical advance directive? No - Patient declined  Copy of [place] in Chart? -  Would patient like information on creating a medical advance directive? -  Pre-existing out of facility DNR order (yellow form or pink MOST form) Yellow form placed in chart (order not valid for inpatient use)     Chief Complaint  Patient presents with  . Medical Management of Chronic Issues  . Health Maintenance    Hep C screen, Colonoscopy, foot and eye exam and PCV13    HPI:  Pt is a 63 y.o. male seen today for medical management of chronic diseases.     Past Medical History:  Diagnosis Date  . Adult onset fluency disorder   . Coronary artery disease    a. s/p stent left anterior descending artery remotely (per 2017 note, "about 4 years" prior). b. Normal nuc 05/2015.  . Diabetes mellitus type 2, controlled ([place]) 01/18/2016  . Dysphagia 2018  . Gait instability 2018  . History of falling   . Hypercholesterolemia   . Hypertension   . Memory change 2018  . Parkinson's disease ([place]) 2018  . Weight loss 2017   intentional   Past Surgical History:  Procedure Laterality Date  . CAROTID STENT  2012   in left  anterior descending artery past EF 55%  . nuclear stress test  06/17/2015   Gated SPECT images reveal normal LV systolic function. EF 55%. Comparing the rest & stress SPECT images, no ischemia. Gated images are normal. Dr. [name], MD [name], [name]     Allergies  Allergen Reactions  . Codeine     Unknown     Allergies as of 09/15/2019      Reactions   Codeine    Unknown       Medication List       Accurate as of September 15, 2019 11:40 AM. If you have any questions, ask your nurse or doctor.        aspirin 81 MG chewable tablet Chew 81 mg by mouth daily.   atorvastatin 10 MG tablet Commonly known as: LIPITOR Take 1 tablet (10 mg total) by mouth at bedtime.   carbidopa-levodopa 25-100 MG tablet Commonly known as: SINEMET IR Take 2 tablets by mouth 4 (four) times daily.   donepezil 5 MG disintegrating tablet Commonly known as: ARICEPT ODT Take 5 mg by mouth at bedtime.   lisinopril 10 MG tablet Commonly known as: ZESTRIL Take 10 mg by mouth daily.   Metoprolol Succinate 25 MG Cs24 Take 25 mg by mouth daily.   rOPINIRole 0.25 MG tablet Commonly known as: REQUIP Take 0.25 mg by mouth at bedtime.   sennosides-docusate sodium 8.6-50 MG tablet Commonly known as: SENOKOT-S  Take 1 tablet by mouth every other day.       Review of Systems  Immunization History  Administered Date(s) Administered  . Influenza Whole 06/20/2018  . Influenza, High Dose Seasonal PF 06/20/2019  . Influenza-Unspecified 07/08/2017  . PPD Test 01/15/2017  . Pneumococcal Polysaccharide-23 08/05/2018  . Td 09/17/2016   Pertinent  Health Maintenance Due  Topic Date Due  . COLONOSCOPY  06/03/2004  . OPHTHALMOLOGY EXAM  10/08/2017  . FOOT EXAM  01/21/2018  . PNA vac Low Risk Adult (1 of 2 - PCV13) 08/06/2019  . HEMOGLOBIN A1C  12/07/2019  . INFLUENZA VACCINE  Completed   Fall Risk  01/21/2017  Falls in the past year? Yes  Number falls in past yr: 2 or more  Injury with Fall?  Yes  Risk Factor Category  High Fall Risk  Risk for fall due to : History of fall(s);Impaired balance/gait;Impaired mobility  Follow up Falls evaluation completed   Functional Status Survey:    Vitals:   09/15/19 1137  BP: 130/78  Pulse: 62  Resp: 18  Temp: (!) 97.1 F (36.2 C)  SpO2: 95%  Weight: 230 lb 6.4 oz (104.5 kg)  Height: 5\' 11"  (1.803 m)   Body mass index is 32.13 kg/m. Physical Exam  Labs reviewed: Recent Labs    10/05/18 0922 03/27/19 0000 05/28/19 0000 06/30/19 0000  NA 138 141 141 139  K 3.8 4.1 4.0 4.1  CL 103  --  106  --   CO2 24  --  29  --   GLUCOSE 122*  --   --   --   BUN 12 12 10 14   CREATININE 0.71 0.8 0.8 0.8  CALCIUM 8.9  --  8.8  --    Recent Labs    03/27/19 0000 05/28/19 0000 06/30/19 0000  AST 10* 10* 8*  ALT 15 4* 4*  ALKPHOS  --  94 116  PROT  --  5.9  --   ALBUMIN  --  3.7  --    Recent Labs    10/05/18 0922 03/27/19 0000 05/28/19 0000 06/30/19 0000  WBC 6.5  --  6.4 6.4  NEUTROABS 4.1  --   --  3,514  HGB 13.3 13.0* 12.9* 12.9*  HCT 43.5 40* 41 40*  MCV 93.1  --   --   --   PLT 217 243 207 209   Lab Results  Component Value Date   TSH 2.51 03/27/2019   Lab Results  Component Value Date   HGBA1C 5.9 06/09/2019   Lab Results  Component Value Date   CHOL 77 06/09/2019   HDL 30 (A) 06/09/2019   LDLCALC 28 06/09/2019   TRIG 103 06/09/2019   CHOLHDL 3.1 08/04/2018    Significant Diagnostic Results in last 30 days:  No results found.  Assessment/Plan There are no diagnoses linked to this encounter.   Family/ staff Communication:   Labs/tests ordered:

## 2019-09-21 ENCOUNTER — Encounter: Payer: Self-pay | Admitting: Nurse Practitioner

## 2019-09-21 ENCOUNTER — Non-Acute Institutional Stay (SKILLED_NURSING_FACILITY): Payer: Medicare Other | Admitting: Nurse Practitioner

## 2019-09-21 DIAGNOSIS — I10 Essential (primary) hypertension: Secondary | ICD-10-CM

## 2019-09-21 DIAGNOSIS — R2681 Unsteadiness on feet: Secondary | ICD-10-CM | POA: Diagnosis not present

## 2019-09-21 DIAGNOSIS — R4189 Other symptoms and signs involving cognitive functions and awareness: Secondary | ICD-10-CM | POA: Diagnosis not present

## 2019-09-21 DIAGNOSIS — R296 Repeated falls: Secondary | ICD-10-CM | POA: Diagnosis not present

## 2019-09-21 DIAGNOSIS — G231 Progressive supranuclear ophthalmoplegia [Steele-Richardson-Olszewski]: Secondary | ICD-10-CM

## 2019-09-21 NOTE — Progress Notes (Signed)
Location:   [facility] Room Number: 4 Place of Service:  SNF (31) Provider:  Everette Mall NP  Mahlon Gammon, MD  Patient Care Team: Mahlon Gammon, MD as PCP - General (Internal Medicine) Josaiah Muhammed X, NP as Nurse Practitioner (Internal Medicine)  Extended Emergency Contact Information Primary Emergency Contact: Gaertner,Frank Address: [address]          [place], [place] [address] Darden Amber of [place] Home Phone: [phone] Mobile Phone: [phone] Relation: Brother  Code Status:  DNR Goals of care: Advanced Directive information Advanced Directives 09/15/2019  Does Patient Have a Medical Advance Directive? Yes  Type of Advance Directive Out of facility DNR (pink MOST or yellow form)  Does patient want to make changes to medical advance directive? No - Patient declined  Copy of Healthcare Power of Attorney in Chart? -  Would patient like information on creating a medical advance directive? -  Pre-existing out of facility DNR order (yellow form or pink MOST form) Yellow form placed in chart (order not valid for inpatient use)     Chief Complaint  Patient presents with  . Acute Visit    Fall    HPI:  Pt is a 64 y.o. male seen today for an acute visit for recurrent falls, 09/18/19 and 09/20/19 when attempted to transfer w/o assistance. Hx of dementia, resides in SNF [facility] for safety, care assistance, on Donepezil for memory. 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 His blood pressure is controlled on Lisiniopril 10mg  qd, Metoprolol 25mg  qd.    Past Medical History:  Diagnosis Date  . Adult onset fluency disorder   . Coronary artery disease    a. s/p stent left anterior descending artery remotely (per 2017 note, "about 4 years" prior). b. Normal nuc 05/2015.  . Diabetes mellitus type 2, controlled (HCC) 01/18/2016  . Dysphagia 2018  . Gait instability 2018  .  History of falling   . Hypercholesterolemia   . Hypertension   . Memory change 2018  . Parkinson's disease (HCC) 2018  . Weight loss 2017   intentional   Past Surgical History:  Procedure Laterality Date  . CAROTID STENT  2012   in left anterior descending artery past EF 55%  . nuclear stress test  06/17/2015   Gated SPECT images reveal normal LV systolic function. EF 55%. Comparing the rest & stress SPECT images, no ischemia. Gated images are normal. Dr. 2013, MD 06/19/2015, Vernell Barrier     Allergies  Allergen Reactions  . Codeine     Unknown     Allergies as of 09/21/2019      Reactions   Codeine    Unknown       Medication List       Accurate as of September 21, 2019 11:59 PM. If you have any questions, ask your nurse or doctor.        aspirin 81 MG chewable tablet Chew 81 mg by mouth daily.   atorvastatin 10 MG tablet Commonly known as: LIPITOR Take 1 tablet (10 mg total) by mouth at bedtime.   carbidopa-levodopa 25-100 MG tablet Commonly known as: SINEMET IR Take 2 tablets by mouth 4 (four) times daily.   donepezil 5 MG disintegrating tablet Commonly known as: ARICEPT ODT Take 5 mg by mouth at bedtime.   lisinopril 10 MG tablet Commonly known as: ZESTRIL Take 10 mg by mouth daily.   Metoprolol Succinate  25 MG Cs24 Take 25 mg by mouth daily.   rOPINIRole 0.25 MG tablet Commonly known as: REQUIP Take 0.25 mg by mouth at bedtime.   sennosides-docusate sodium 8.6-50 MG tablet Commonly known as: SENOKOT-S Take 1 tablet by mouth every other day.      ROS was provided with assistance of staff.  Review of Systems  Constitutional: Negative for activity change, appetite change, chills, diaphoresis, fatigue, fever and unexpected weight change.  HENT: Negative for congestion, hearing loss and voice change.   Eyes: Negative for visual disturbance.  Respiratory: Negative for cough, shortness of breath and wheezing.   Gastrointestinal: Negative for abdominal  distention, abdominal pain, constipation, diarrhea, nausea and vomiting.  Genitourinary: Negative for difficulty urinating, dysuria and urgency.  Musculoskeletal: Positive for gait problem.  Skin: Negative for color change and pallor.  Neurological: Negative for dizziness, tremors, facial asymmetry, speech difficulty, weakness and headaches.       Memory lapses.   Psychiatric/Behavioral: Negative for agitation, behavioral problems, hallucinations and sleep disturbance. The patient is not nervous/anxious.     Immunization History  Administered Date(s) Administered  . Influenza Whole 06/20/2018  . Influenza, High Dose Seasonal PF 06/20/2019  . Influenza-Unspecified 07/08/2017  . PPD Test 01/15/2017  . Pneumococcal Polysaccharide-23 08/05/2018  . Td 09/17/2016   Pertinent  Health Maintenance Due  Topic Date Due  . COLONOSCOPY  06/03/2004  . OPHTHALMOLOGY EXAM  10/08/2017  . FOOT EXAM  01/21/2018  . PNA vac Low Risk Adult (1 of 2 - PCV13) 08/06/2019  . HEMOGLOBIN A1C  12/07/2019  . INFLUENZA VACCINE  Completed   Fall Risk  01/21/2017  Falls in the past year? Yes  Number falls in past yr: 2 or more  Injury with Fall? Yes  Risk Factor Category  High Fall Risk  Risk for fall due to : History of fall(s);Impaired balance/gait;Impaired mobility  Follow up Falls evaluation completed   Functional Status Survey:    Vitals:   09/21/19 1426  BP: 140/70  Pulse: 82  Resp: 17  Temp: (!) 97.5 F (36.4 C)  SpO2: 93%  Weight: 228 lb 12.8 oz (103.8 kg)  Height: 5\' 11"  (1.803 m)   Body mass index is 31.91 kg/m. Physical Exam Vitals and nursing note reviewed.  Constitutional:      General: He is not in acute distress.    Appearance: Normal appearance. He is not ill-appearing, toxic-appearing or diaphoretic.  HENT:     Head: Normocephalic and atraumatic.     Nose: Nose normal.     Mouth/Throat:     Mouth: Mucous membranes are moist.  Eyes:     Extraocular Movements: Extraocular  movements intact.     Conjunctiva/sclera: Conjunctivae normal.     Pupils: Pupils are equal, round, and reactive to light.     Comments: Difficulty eye movement.   Cardiovascular:     Rate and Rhythm: Normal rate and regular rhythm.     Heart sounds: No murmur.  Pulmonary:     Breath sounds: No wheezing, rhonchi or rales.  Abdominal:     General: Bowel sounds are normal. There is no distension.     Palpations: Abdomen is soft.     Tenderness: There is no abdominal tenderness. There is no right CVA tenderness, left CVA tenderness, guarding or rebound.  Musculoskeletal:     Cervical back: Normal range of motion and neck supple.     Right lower leg: No edema.     Left lower leg: No edema.  Skin:    General: Skin is warm and dry.  Neurological:     General: No focal deficit present.     Mental Status: He is alert. Mental status is at baseline.     Cranial Nerves: No cranial nerve deficit.     Motor: No weakness.     Coordination: Coordination normal.     Gait: Gait abnormal.     Comments: Oriented to person, place.   Psychiatric:        Mood and Affect: Mood normal.        Behavior: Behavior normal.     Comments: Forgetful, poor safety judgement.      Labs reviewed: Recent Labs    10/05/18 0922 03/27/19 0000 05/28/19 0000 06/30/19 0000  NA 138 141 141 139  K 3.8 4.1 4.0 4.1  CL 103  --  106  --   CO2 24  --  29  --   GLUCOSE 122*  --   --   --   BUN 12 12 10 14   CREATININE 0.71 0.8 0.8 0.8  CALCIUM 8.9  --  8.8  --    Recent Labs    03/27/19 0000 05/28/19 0000 06/30/19 0000  AST 10* 10* 8*  ALT 15 4* 4*  ALKPHOS  --  94 116  PROT  --  5.9  --   ALBUMIN  --  3.7  --    Recent Labs    10/05/18 0922 03/27/19 0000 05/28/19 0000 06/30/19 0000  WBC 6.5  --  6.4 6.4  NEUTROABS 4.1  --   --  3,514  HGB 13.3 13.0* 12.9* 12.9*  HCT 43.5 40* 41 40*  MCV 93.1  --   --   --   PLT 217 243 207 209   Lab Results  Component Value Date   TSH 2.51 03/27/2019    Lab Results  Component Value Date   HGBA1C 5.9 06/09/2019   Lab Results  Component Value Date   CHOL 77 06/09/2019   HDL 30 (A) 06/09/2019   LDLCALC 28 06/09/2019   TRIG 103 06/09/2019   CHOLHDL 3.1 08/04/2018    Significant Diagnostic Results in last 30 days:  No results found.  Assessment/Plan Recurrent falls 09/18/19 fall, staff heard the patient's call for help, found the patient in the floor leaning on his right side, stated attempted getting on bed 09/20/19 fall, the patient found on floor in bedroom, stated lost balance when attempted transferring self from w/c to bed, no call for assistance.  09/21/19 the patient has poor safety awareness, not calling and waiting for assistance, his impaired balance/gait has been progressed. Close supervision and assistance needed for safety.   Unsteady gait Progressing, Progressive supranuclear palsy contributory. Close supervision/assistance for safety.   Cognitive impairment Continue SNF [facility] for safety, care assistance, continue Donepezil for memory, close supervision for safety, care assistance.   Hypertension Blood pressure is controlled, continue Lisinopril, Metoprolol.   Progressive supranuclear palsy (HCC) Progressing, continue Sinemet, f/u neurology.     Family/ staff Communication: plan of care reviewed with the patient and charge nurse.   Labs/tests ordered:  none  Time spend 25 minutes.

## 2019-09-22 ENCOUNTER — Encounter: Payer: Self-pay | Admitting: Nurse Practitioner

## 2019-09-22 NOTE — Assessment & Plan Note (Signed)
Continue SNF [facility] for safety, care assistance, continue Donepezil for memory, close supervision for safety, care assistance.

## 2019-09-22 NOTE — Assessment & Plan Note (Signed)
Progressing, continue Sinemet, f/u neurology.

## 2019-09-22 NOTE — Assessment & Plan Note (Signed)
Progressing, Progressive supranuclear palsy contributory. Close supervision/assistance for safety.

## 2019-09-22 NOTE — Assessment & Plan Note (Signed)
09/18/19 fall, staff heard the patient's call for help, found the patient in the floor leaning on his right side, stated attempted getting on bed 09/20/19 fall, the patient found on floor in bedroom, stated lost balance when attempted transferring self from w/c to bed, no call for assistance.  09/21/19 the patient has poor safety awareness, not calling and waiting for assistance, his impaired balance/gait has been progressed. Close supervision and assistance needed for safety.

## 2019-09-22 NOTE — Assessment & Plan Note (Signed)
Blood pressure is controlled, continue Lisinopril, Metoprolol . 

## 2019-09-28 ENCOUNTER — Non-Acute Institutional Stay (SKILLED_NURSING_FACILITY): Payer: Medicare Other | Admitting: Nurse Practitioner

## 2019-09-28 ENCOUNTER — Encounter: Payer: Self-pay | Admitting: Nurse Practitioner

## 2019-09-28 DIAGNOSIS — R4189 Other symptoms and signs involving cognitive functions and awareness: Secondary | ICD-10-CM | POA: Diagnosis not present

## 2019-09-28 DIAGNOSIS — G231 Progressive supranuclear ophthalmoplegia [Steele-Richardson-Olszewski]: Secondary | ICD-10-CM | POA: Diagnosis not present

## 2019-09-28 DIAGNOSIS — I10 Essential (primary) hypertension: Secondary | ICD-10-CM

## 2019-09-28 DIAGNOSIS — R296 Repeated falls: Secondary | ICD-10-CM | POA: Diagnosis not present

## 2019-09-28 DIAGNOSIS — T148XXA Other injury of unspecified body region, initial encounter: Secondary | ICD-10-CM | POA: Diagnosis not present

## 2019-09-28 NOTE — Progress Notes (Signed)
Location:   [facility] Room Number: 4 Place of Service:  SNF (31) Provider:  Neena Beecham NP  Mahlon Gammon, MD  Patient Care Team: Mahlon Gammon, MD as PCP - General (Internal Medicine) Matis Monnier X, NP as Nurse Practitioner (Internal Medicine)  Extended Emergency Contact Information Primary Emergency Contact: Roma,Frank Address: [address]          [place], [place] [address] [place] of [place] Home Phone: [phone] Mobile Phone: [phone] Relation: Brother  Code Status:  DNR Goals of care: Advanced Directive information Advanced Directives 09/15/2019  Does Patient Have a Medical Advance Directive? Yes  Type of Advance Directive Out of facility DNR (pink MOST or yellow form)  Does patient want to make changes to medical advance directive? No - Patient declined  Copy of Healthcare Power of Attorney in Chart? -  Would patient like information on creating a medical advance directive? -  Pre-existing out of facility DNR order (yellow form or pink MOST form) Yellow form placed in chart (order not valid for inpatient use)     Chief Complaint  Patient presents with  . Acute Visit    Fall, bruise    HPI:  Pt is a 64 y.o. male seen today for an acute visit for 09/28/19 right lower breast, right flank/abd bruise, about a palm sized, denied pain, sustained from fall. Hx of dementia, gradual progressing, resides in SNF [facility], on Donepezil. Progressive supranuclear palsy, on Requip, Sinemet. HTN, blood pressure is controlled on Metoprolol, Lisinopril.    Past Medical History:  Diagnosis Date  . Adult onset fluency disorder   . Coronary artery disease    a. s/p stent left anterior descending artery remotely (per 2017 note, "about 4 years" prior). b. Normal nuc 05/2015.  . Diabetes mellitus type 2, controlled (HCC) 01/18/2016  . Dysphagia 2018  . Gait instability 2018  . History of falling   . Hypercholesterolemia   . Hypertension   . Memory  change 2018  . Parkinson's disease (HCC) 2018  . Weight loss 2017   intentional   Past Surgical History:  Procedure Laterality Date  . CAROTID STENT  2012   in left anterior descending artery past EF 55%  . nuclear stress test  06/17/2015   Gated SPECT images reveal normal LV systolic function. EF 55%. Comparing the rest & stress SPECT images, no ischemia. Gated images are normal. Dr. Vernell Barrier, MD [place], [place]     Allergies  Allergen Reactions  . Codeine     Unknown     Allergies as of 09/28/2019      Reactions   Codeine    Unknown       Medication List       Accurate as of September 28, 2019 11:59 PM. If you have any questions, ask your nurse or doctor.        aspirin 81 MG chewable tablet Chew 81 mg by mouth daily.   atorvastatin 10 MG tablet Commonly known as: LIPITOR Take 1 tablet (10 mg total) by mouth at bedtime.   carbidopa-levodopa 25-100 MG tablet Commonly known as: SINEMET IR Take 2 tablets by mouth 4 (four) times daily.   donepezil 5 MG disintegrating tablet Commonly known as: ARICEPT ODT Take 5 mg by mouth at bedtime.   lisinopril 10 MG tablet Commonly known as: ZESTRIL Take 10 mg by mouth daily.   Metoprolol Succinate 25 MG Cs24 Take 25 mg by mouth daily.   rOPINIRole 0.25  MG tablet Commonly known as: REQUIP Take 0.25 mg by mouth at bedtime.   sennosides-docusate sodium 8.6-50 MG tablet Commonly known as: SENOKOT-S Take 1 tablet by mouth every other day.       Review of Systems  Constitutional: Negative for activity change, appetite change, chills, diaphoresis, fatigue and fever.  HENT: Negative for congestion and voice change.   Eyes: Negative for visual disturbance.  Respiratory: Negative for shortness of breath and wheezing.   Cardiovascular: Negative for chest pain, palpitations and leg swelling.  Gastrointestinal: Negative for abdominal distention, abdominal pain, constipation, diarrhea, nausea and vomiting.  Genitourinary:  Negative for difficulty urinating, dysuria and urgency.  Musculoskeletal: Positive for gait problem.  Skin: Positive for color change.       Bruise right lower breast, right flank/abd about his balm sized.   Neurological: Negative for dizziness, syncope, weakness and headaches.       Memory lapses.   Psychiatric/Behavioral: Negative for agitation, behavioral problems, hallucinations and sleep disturbance. The patient is not nervous/anxious.     Immunization History  Administered Date(s) Administered  . Influenza Whole 06/20/2018  . Influenza, High Dose Seasonal PF 06/20/2019  . Influenza-Unspecified 07/08/2017  . PPD Test 01/15/2017  . Pneumococcal Polysaccharide-23 08/05/2018  . Td 09/17/2016   Pertinent  Health Maintenance Due  Topic Date Due  . COLONOSCOPY  06/03/2004  . OPHTHALMOLOGY EXAM  10/08/2017  . FOOT EXAM  01/21/2018  . PNA vac Low Risk Adult (1 of 2 - PCV13) 08/06/2019  . HEMOGLOBIN A1C  12/07/2019  . INFLUENZA VACCINE  Completed   Fall Risk  01/21/2017  Falls in the past year? Yes  Number falls in past yr: 2 or more  Injury with Fall? Yes  Risk Factor Category  High Fall Risk  Risk for fall due to : History of fall(s);Impaired balance/gait;Impaired mobility  Follow up Falls evaluation completed   Functional Status Survey:    Vitals:   09/28/19 1505  BP: 132/70  Pulse: 74  Resp: 17  Temp: 97.7 F (36.5 C)  SpO2: 94%  Weight: 228 lb 12.8 oz (103.8 kg)  Height: 5\' 11"  (1.803 m)   Body mass index is 31.91 kg/m. Physical Exam Vitals and nursing note reviewed.  Constitutional:      General: He is not in acute distress.    Appearance: He is not ill-appearing, toxic-appearing or diaphoretic.  HENT:     Head: Normocephalic and atraumatic.     Nose: Nose normal.     Mouth/Throat:     Mouth: Mucous membranes are moist.  Eyes:     Extraocular Movements: Extraocular movements intact.     Conjunctiva/sclera: Conjunctivae normal.     Pupils: Pupils are  equal, round, and reactive to light.     Comments: Difficulty eye movement.   Cardiovascular:     Rate and Rhythm: Normal rate and regular rhythm.     Heart sounds: No murmur.  Pulmonary:     Effort: Pulmonary effort is normal.     Breath sounds: No wheezing, rhonchi or rales.  Abdominal:     General: Bowel sounds are normal. There is no distension.     Palpations: Abdomen is soft.     Tenderness: There is no abdominal tenderness. There is no right CVA tenderness, left CVA tenderness, guarding or rebound.  Musculoskeletal:        General: Normal range of motion.     Cervical back: Normal range of motion and neck supple.     Right  lower leg: No edema.     Left lower leg: No edema.  Skin:    General: Skin is warm and dry.     Findings: Bruising present.     Comments: Bruise right lower breast, right flank/abd about his balm sized.   Neurological:     General: No focal deficit present.     Mental Status: He is alert. Mental status is at baseline.     Motor: No weakness.     Coordination: Coordination normal.     Gait: Gait abnormal.     Comments: Oriented to person, place. Moves slow  Psychiatric:        Mood and Affect: Mood normal.        Behavior: Behavior normal.        Thought Content: Thought content normal.     Labs reviewed: Recent Labs    10/05/18 0922 03/27/19 0000 05/28/19 0000 06/30/19 0000  NA 138 141 141 139  K 3.8 4.1 4.0 4.1  CL 103  --  106  --   CO2 24  --  29  --   GLUCOSE 122*  --   --   --   BUN 12 12 10 14   CREATININE 0.71 0.8 0.8 0.8  CALCIUM 8.9  --  8.8  --    Recent Labs    03/27/19 0000 05/28/19 0000 06/30/19 0000  AST 10* 10* 8*  ALT 15 4* 4*  ALKPHOS  --  94 116  PROT  --  5.9  --   ALBUMIN  --  3.7  --    Recent Labs    10/05/18 0922 03/27/19 0000 05/28/19 0000 06/30/19 0000  WBC 6.5  --  6.4 6.4  NEUTROABS 4.1  --   --  3,514  HGB 13.3 13.0* 12.9* 12.9*  HCT 43.5 40* 41 40*  MCV 93.1  --   --   --   PLT 217 243 207  209   Lab Results  Component Value Date   TSH 2.51 03/27/2019   Lab Results  Component Value Date   HGBA1C 5.9 06/09/2019   Lab Results  Component Value Date   CHOL 77 06/09/2019   HDL 30 (A) 06/09/2019   LDLCALC 28 06/09/2019   TRIG 103 06/09/2019   CHOLHDL 3.1 08/04/2018    Significant Diagnostic Results in last 30 days:  No results found.  Assessment/Plan Bruise 09/28/19 right lower breast, right flank/abd bruise, about a palm sized, denied pain, sustained from fall.   Recurrent falls 09/26/19 reported fall and bruise. Lower right breast/right flan/abd bruise. Frequent falling, unsteady gait from Progressive supranuclear palsy and his progressing dementia. Close supervision for safety.   Cognitive impairment Progressing gradually, continue SNF [facility] for safety, care assistance, continue Donepezil for memory, close supervision for safety.   Progressive supranuclear palsy (HCC) Progressing, frequent falling, continue SN[facility] for safety, care assistance, continue Requip, Sinemet, f/u Neurology.   Hypertension Blood pressure is controlled, continue Lisinopril, Metoprolol.      Family/ staff Communication: plan of care reviewed with the patient and charge nurse.   Labs/tests ordered:  none  Time spend 25 minutes.

## 2019-09-29 ENCOUNTER — Encounter: Payer: Self-pay | Admitting: Nurse Practitioner

## 2019-09-29 NOTE — Assessment & Plan Note (Signed)
Blood pressure is controlled, continue Lisinopril, Metoprolol . 

## 2019-09-29 NOTE — Assessment & Plan Note (Signed)
09/28/19 right lower breast, right flank/abd bruise, about a palm sized, denied pain, sustained from fall.

## 2019-09-29 NOTE — Assessment & Plan Note (Signed)
Progressing gradually, continue SN[facility] for safety, care assistance, continue Donepezil for memory, close supervision for safety.

## 2019-09-29 NOTE — Assessment & Plan Note (Signed)
Progressing, frequent falling, continue SN[facility] for safety, care assistance, continue Requip, Sinemet, f/u Neurology.

## 2019-09-29 NOTE — Assessment & Plan Note (Signed)
09/26/19 reported fall and bruise. Lower right breast/right flan/abd bruise. Frequent falling, unsteady gait from Progressive supranuclear palsy and his progressing dementia. Close supervision for safety.

## 2019-10-07 ENCOUNTER — Non-Acute Institutional Stay (SKILLED_NURSING_FACILITY): Payer: Medicare Other | Admitting: Nurse Practitioner

## 2019-10-07 ENCOUNTER — Encounter: Payer: Self-pay | Admitting: Nurse Practitioner

## 2019-10-07 DIAGNOSIS — R2681 Unsteadiness on feet: Secondary | ICD-10-CM | POA: Diagnosis not present

## 2019-10-07 DIAGNOSIS — G231 Progressive supranuclear ophthalmoplegia [Steele-Richardson-Olszewski]: Secondary | ICD-10-CM

## 2019-10-07 DIAGNOSIS — R4189 Other symptoms and signs involving cognitive functions and awareness: Secondary | ICD-10-CM

## 2019-10-07 DIAGNOSIS — R296 Repeated falls: Secondary | ICD-10-CM

## 2019-10-07 DIAGNOSIS — I10 Essential (primary) hypertension: Secondary | ICD-10-CM

## 2019-10-07 NOTE — Assessment & Plan Note (Signed)
Poor safety awareness, continue Donepezil, close supervision/assistance for safety needed.

## 2019-10-07 NOTE — Assessment & Plan Note (Signed)
Blood pressure is controlled, continue Lisinopril, Metoprolol . 

## 2019-10-07 NOTE — Assessment & Plan Note (Signed)
Frequent falling, increased balance issue, continue Sinemet, Requip.

## 2019-10-07 NOTE — Assessment & Plan Note (Signed)
Close supervision/assistance for transfer, w/c for mobility.

## 2019-10-07 NOTE — Progress Notes (Signed)
Location:   [facility] Room Number: 4 Place of Service:  SNF (31) Provider:  Lynisha Osuch, NP  [name], MD  Patient Care Team: [name], MD as PCP - General (Internal Medicine) Yovanna Cogan X, NP as Nurse Practitioner (Internal Medicine)  Extended Emergency Contact Information Primary Emergency Contact: Alphin,Frank Address: [place], [place] [address] Johnnette Litter of [place] Phone: [phone] Mobile Phone: [phone] Relation: Brother  Code Status:  DNR Goals of care: Advanced Directive information Advanced Directives 10/07/2019  Does Patient Have a Medical Advance Directive? Yes  Type of Paramedic of [place];Out of facility DNR (pink MOST or yellow form)  Does patient want to make changes to medical advance directive? No - Patient declined  Copy of [place] in Chart? Yes - validated most recent copy scanned in chart (See row information)  Would patient like information on creating a medical advance directive? -  Pre-existing out of facility DNR order (yellow form or pink MOST form) Pink MOST/Yellow Form most recent copy in chart - Physician notified to receive inpatient order     Chief Complaint  Patient presents with  . Acute Visit    Fall    HPI:  Pt is a 64 y.o. male seen today for an acute visit for the patient resides in [facility] for safety, care assistance, needs supervision/assistance for transfer, w/c for mobility, on Donepezil for memory. Frequent falls due to his poor safety awareness and increased gait/balance issue. Hx of progressive supranuclear palsy, on Sinemet, Requip. HTN, blood pressure is controlled on Lisinopril and Metoprolol.    Past Medical History:  Diagnosis Date  . Adult onset fluency disorder   . Coronary artery disease    a. s/p stent left anterior descending artery remotely (per 2017 note, "about 4 years" prior). b. Normal nuc 05/2015.  .  Diabetes mellitus type 2, controlled ([place]) 01/18/2016  . Dysphagia 2018  . Gait instability 2018  . History of falling   . Hypercholesterolemia   . Hypertension   . Memory change 2018  . Parkinson's disease ([place]) 2018  . Weight loss 2017   intentional   Past Surgical History:  Procedure Laterality Date  . CAROTID STENT  2012   in left anterior descending artery past EF 55%  . nuclear stress test  06/17/2015   Gated SPECT images reveal normal LV systolic function. EF 55%. Comparing the rest & stress SPECT images, no ischemia. Gated images are normal. Dr. [name], MD [name], [name]     Allergies  Allergen Reactions  . Codeine     Unknown     Allergies as of 10/07/2019      Reactions   Codeine    Unknown       Medication List       Accurate as of October 07, 2019  4:49 PM. If you have any questions, ask your nurse or doctor.        aspirin 81 MG chewable tablet Chew 81 mg by mouth daily. 7am-10am.   atorvastatin 10 MG tablet Commonly known as: LIPITOR Take 10 mg by mouth daily. 7pm-10pm What changed: Another medication with the same name was removed. Continue taking this medication, and follow the directions you see here. Changed by: Jetty Berland X Isack Lavalley, NP   carbidopa-levodopa 25-100 MG tablet Commonly known as: SINEMET IR Take 2 tablets by mouth 4 (four) times daily.  7am,12pm,4pm,9:30pm   donepezil 5 MG disintegrating tablet Commonly known as: ARICEPT ODT Take 5 mg by mouth at bedtime. 7pm-10pm.   lisinopril 10 MG tablet Commonly known as: ZESTRIL Take 10 mg by mouth daily. 7am-10am.   Metoprolol Succinate 25 MG Cs24 Take 25 mg by mouth daily. 7am-10am   rOPINIRole 0.25 MG tablet Commonly known as: REQUIP Take 0.25 mg by mouth at bedtime. 7pm-10pm.   sennosides-docusate sodium 8.6-50 MG tablet Commonly known as: SENOKOT-S Take 1 tablet by mouth every other day. 8pm.      ROS was provided with assistance of staff.  Review of Systems  Constitutional:  Negative for activity change, appetite change, chills, diaphoresis, fatigue and fever.  HENT: Positive for hearing loss. Negative for congestion and voice change.   Eyes: Negative for visual disturbance.  Respiratory: Negative for cough and shortness of breath.   Cardiovascular: Negative for chest pain, palpitations and leg swelling.  Gastrointestinal: Negative for abdominal distention, abdominal pain, constipation, diarrhea, nausea and vomiting.  Genitourinary: Negative for difficulty urinating, dysuria and urgency.  Musculoskeletal: Positive for gait problem.  Skin: Negative for color change and pallor.  Neurological: Negative for dizziness, speech difficulty, weakness and headaches.       Dementia.   Psychiatric/Behavioral: Negative for agitation, behavioral problems, hallucinations and sleep disturbance. The patient is not nervous/anxious.     Immunization History  Administered Date(s) Administered  . Influenza Whole 06/20/2018  . Influenza, High Dose Seasonal PF 06/20/2019  . Influenza-Unspecified 07/08/2017  . PFIZER SARS-COV-2 Vaccination 09/19/2019  . PPD Test 01/15/2017  . Pneumococcal Polysaccharide-23 08/05/2018  . Td 09/17/2016   Pertinent  Health Maintenance Due  Topic Date Due  . COLONOSCOPY  06/03/2004  . OPHTHALMOLOGY EXAM  10/08/2017  . FOOT EXAM  01/21/2018  . PNA vac Low Risk Adult (1 of 2 - PCV13) 08/06/2019  . HEMOGLOBIN A1C  12/07/2019  . INFLUENZA VACCINE  Completed   Fall Risk  01/21/2017  Falls in the past year? Yes  Number falls in past yr: 2 or more  Injury with Fall? Yes  Risk Factor Category  High Fall Risk  Risk for fall due to : History of fall(s);Impaired balance/gait;Impaired mobility  Follow up Falls evaluation completed   Functional Status Survey:    Vitals:   10/07/19 1451  BP: 128/72  Pulse: 70  Resp: 18  Temp: (!) 97.5 F (36.4 C)  SpO2: 92%  Weight: 228 lb 12.8 oz (103.8 kg)  Height: 5\' 11"  (1.803 m)   Body mass index is  31.91 kg/m. Physical Exam Vitals and nursing note reviewed.  Constitutional:      General: He is not in acute distress.    Appearance: Normal appearance. He is not ill-appearing, toxic-appearing or diaphoretic.  HENT:     Head: Normocephalic and atraumatic.     Nose: Nose normal.  Eyes:     Extraocular Movements: Extraocular movements intact.     Conjunctiva/sclera: Conjunctivae normal.     Pupils: Pupils are equal, round, and reactive to light.     Comments: Downward gaze  Cardiovascular:     Rate and Rhythm: Normal rate and regular rhythm.     Heart sounds: No murmur.  Pulmonary:     Breath sounds: No wheezing, rhonchi or rales.  Abdominal:     General: Bowel sounds are normal. There is no distension.     Palpations: Abdomen is soft.     Tenderness: There is no abdominal tenderness. There is no right CVA tenderness,  left CVA tenderness, guarding or rebound.  Musculoskeletal:     Cervical back: Normal range of motion and neck supple.     Right lower leg: No edema.     Left lower leg: No edema.  Skin:    General: Skin is warm and dry.  Neurological:     General: No focal deficit present.     Mental Status: He is alert. Mental status is at baseline.     Motor: No weakness.     Coordination: Coordination abnormal.     Gait: Gait abnormal.     Comments: Oriented to person, place. Mild rigidity in all limbs.   Psychiatric:        Mood and Affect: Mood normal.        Behavior: Behavior normal.     Labs reviewed: Recent Labs    03/27/19 0000 05/28/19 0000 06/30/19 0000  NA 141 141 139  K 4.1 4.0 4.1  CL  --  106  --   CO2  --  29  --   BUN 12 10 14   CREATININE 0.8 0.8 0.8  CALCIUM  --  8.8  --    Recent Labs    03/27/19 0000 05/28/19 0000 06/30/19 0000  AST 10* 10* 8*  ALT 15 4* 4*  ALKPHOS  --  94 116  PROT  --  5.9  --   ALBUMIN  --  3.7  --    Recent Labs    03/27/19 0000 05/28/19 0000 06/30/19 0000  WBC  --  6.4 6.4  NEUTROABS  --   --  3,514    HGB 13.0* 12.9* 12.9*  HCT 40* 41 40*  PLT 243 207 209   Lab Results  Component Value Date   TSH 2.51 03/27/2019   Lab Results  Component Value Date   HGBA1C 5.9 06/09/2019   Lab Results  Component Value Date   CHOL 77 06/09/2019   HDL 30 (A) 06/09/2019   LDLCALC 28 06/09/2019   TRIG 103 06/09/2019   CHOLHDL 3.1 08/04/2018    Significant Diagnostic Results in last 30 days:  No results found.  Assessment/Plan Recurrent falls 10/06/19 fall when the patient was found on floor in his bedroom between his recliner and bed on his knees trying to get up. Lack of safety awareness, unsteady gait/balance/frailty are contributory. Close supervision/assistance needed for transfer, w/c for mobility.   Unsteady gait Close supervision/assistance for transfer, w/c for mobility.   Cognitive impairment Poor safety awareness, continue Donepezil, close supervision/assistance for safety needed.   Progressive supranuclear palsy (HCC) Frequent falling, increased balance issue, continue Sinemet, Requip.   Hypertension Blood pressure is controlled, continue Lisinopril, Metoprolol.      Family/ staff Communication: plan of care reviewed with the patient and charge nurse.   Labs/tests ordered:  none  Time spend 25 minutes.

## 2019-10-07 NOTE — Assessment & Plan Note (Signed)
10/06/19 fall when the patient was found on floor in his bedroom between his recliner and bed on his knees trying to get up. Lack of safety awareness, unsteady gait/balance/frailty are contributory. Close supervision/assistance needed for transfer, w/c for mobility.

## 2019-10-11 ENCOUNTER — Emergency Department (HOSPITAL_COMMUNITY): Payer: Medicare Other

## 2019-10-11 ENCOUNTER — Other Ambulatory Visit: Payer: Self-pay

## 2019-10-11 ENCOUNTER — Encounter (HOSPITAL_COMMUNITY): Payer: Self-pay | Admitting: *Deleted

## 2019-10-11 ENCOUNTER — Emergency Department (HOSPITAL_COMMUNITY)
Admission: EM | Admit: 2019-10-11 | Discharge: 2019-10-11 | Disposition: A | Discharge status: Home / Self Care | Payer: Medicare Other | Attending: Emergency Medicine | Admitting: Emergency Medicine

## 2019-10-11 DIAGNOSIS — I252 Old myocardial infarction: Secondary | ICD-10-CM | POA: Insufficient documentation

## 2019-10-11 DIAGNOSIS — Z7982 Long term (current) use of aspirin: Secondary | ICD-10-CM | POA: Insufficient documentation

## 2019-10-11 DIAGNOSIS — Y939 Activity, unspecified: Secondary | ICD-10-CM | POA: Insufficient documentation

## 2019-10-11 DIAGNOSIS — Z950 Presence of cardiac pacemaker: Secondary | ICD-10-CM | POA: Insufficient documentation

## 2019-10-11 DIAGNOSIS — S0990XA Unspecified injury of head, initial encounter: Secondary | ICD-10-CM

## 2019-10-11 DIAGNOSIS — G20 Parkinson's disease: Secondary | ICD-10-CM | POA: Insufficient documentation

## 2019-10-11 DIAGNOSIS — I2510 Atherosclerotic heart disease of native coronary artery without angina pectoris: Secondary | ICD-10-CM | POA: Insufficient documentation

## 2019-10-11 DIAGNOSIS — I10 Essential (primary) hypertension: Secondary | ICD-10-CM | POA: Insufficient documentation

## 2019-10-11 DIAGNOSIS — R296 Repeated falls: Secondary | ICD-10-CM | POA: Diagnosis not present

## 2019-10-11 DIAGNOSIS — W1830XA Fall on same level, unspecified, initial encounter: Secondary | ICD-10-CM | POA: Insufficient documentation

## 2019-10-11 DIAGNOSIS — M542 Cervicalgia: Secondary | ICD-10-CM | POA: Insufficient documentation

## 2019-10-11 DIAGNOSIS — Y92129 Unspecified place in nursing home as the place of occurrence of the external cause: Secondary | ICD-10-CM | POA: Diagnosis not present

## 2019-10-11 DIAGNOSIS — Z79899 Other long term (current) drug therapy: Secondary | ICD-10-CM | POA: Insufficient documentation

## 2019-10-11 DIAGNOSIS — E119 Type 2 diabetes mellitus without complications: Secondary | ICD-10-CM | POA: Diagnosis not present

## 2019-10-11 DIAGNOSIS — W19XXXA Unspecified fall, initial encounter: Secondary | ICD-10-CM

## 2019-10-11 DIAGNOSIS — S0181XA Laceration without foreign body of other part of head, initial encounter: Secondary | ICD-10-CM | POA: Insufficient documentation

## 2019-10-11 DIAGNOSIS — Y999 Unspecified external cause status: Secondary | ICD-10-CM | POA: Diagnosis not present

## 2019-10-11 MED ORDER — LIDOCAINE-EPINEPHRINE (PF) 2 %-1:200000 IJ SOLN
INTRAMUSCULAR | Status: AC
  Administered 2019-10-11: 20 mL
  Filled 2019-10-11: qty 20

## 2019-10-11 NOTE — ED Notes (Signed)
Attempted to notify [facility] that patient is ready for discharge. Unable to contact facility.

## 2019-10-11 NOTE — Discharge Instructions (Signed)
Keep wound clean and dry. Apply a clean bandage daily. Have stitches removed in 5-7 days

## 2019-10-11 NOTE — ED Provider Notes (Addendum)
[facility]-EMERGENCY DEPT Provider Note   CSN: [id number] Arrival date & time: 10/11/19  1511     History Chief Complaint  Patient presents with  . Fall  . Laceration    [facility] is a 64 y.o. male with history of Parkinson's disease and frequent falls who presents with a fall and head injury. Pt is from [facility] and cannot remember the fall. He had an unwitnessed fall and sustained a laceration to the right forehead. He is also reporting posterior neck pain but denies any other symptoms. Pt denies LOC, dizziness, nausea, chest pain, SOB, abdominal pain, upper or lower extremity pain. He is not on blood thinners. He is UTD on tetanus.  HPI     Past Medical History:  Diagnosis Date  . Adult onset fluency disorder   . Coronary artery disease    a. s/p stent left anterior descending artery remotely (per 2017 note, "about 4 years" prior). b. Normal nuc 05/2015.  . Diabetes mellitus type 2, controlled (HCC) 01/18/2016  . Dysphagia 2018  . Gait instability 2018  . History of falling   . Hypercholesterolemia   . Hypertension   . Memory change 2018  . Parkinson's disease (HCC) 2018  . Weight loss 2017   intentional    Patient Active Problem List   Diagnosis Date Noted  . Bradycardia 08/12/2019  . Pacemaker 07/13/2019  . Weight loss 03/25/2019  . Recurrent falls 01/20/2019  . Dizziness 10/06/2018  . Constipation 06/03/2017  . Obesity (BMI 30-39.9) 05/10/2017  . Insomnia 03/19/2017  . NSTEMI (non-ST elevated myocardial infarction) (HCC)   . Hypertension   . Hypercholesterolemia   . 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 Cognitive impairment 09/17/2016  . Unsteady gait 09/17/2016  . Dysphagia 09/17/2016  . Diabetes mellitus type 2, controlled (HCC) 01/18/2016  . Coronary artery disease 09/17/2014    Past Surgical History:  Procedure Laterality Date  . CAROTID STENT  2012   in left anterior descending artery past EF 55%  .  nuclear stress test  06/17/2015   Gated SPECT images reveal normal LV systolic function. EF 55%. Comparing the rest & stress SPECT images, no ischemia. Gated images are normal. Dr. Vernell Barrier, MD [place], [place]        Family History  Problem Relation Age of Onset  . Cancer Mother   . Heart disease Mother     Social History   Tobacco Use  . Smoking status: Never Smoker  . Smokeless tobacco: Never Used  Substance Use Topics  . Alcohol use: No  . Drug use: No    Home Medications Prior to Admission medications   Medication Sig Start Date End Date Taking? Authorizing Provider  aspirin 81 MG chewable tablet Chew 81 mg by mouth daily. 7am-10am.    [provider]  atorvastatin (LIPITOR) 10 MG tablet Take 10 mg by mouth daily. 7pm-10pm    [provider]  carbidopa-levodopa (SINEMET IR) 25-100 MG tablet Take 2 tablets by mouth 4 (four) times daily. 7am,12pm,4pm,9:30pm 04/10/19   [provider]  donepezil (ARICEPT ODT) 5 MG disintegrating tablet Take 5 mg by mouth at bedtime. 7pm-10pm.    [provider]  lisinopril (PRINIVIL,ZESTRIL) 10 MG tablet Take 10 mg by mouth daily. 7am-10am.    [provider]  Metoprolol Succinate 25 MG CS24 Take 25 mg by mouth daily. 7am-10am    [provider]  rOPINIRole (REQUIP) 0.25 MG tablet Take 0.25 mg by mouth at bedtime.  7pm-10pm.    [provider]  sennosides-docusate sodium (SENOKOT-S) 8.6-50 MG tablet Take 1 tablet by mouth every other day. 8pm.    [provider]    Allergies    Codeine  Review of Systems   Review of Systems  Respiratory: Negative for shortness of breath.   Cardiovascular: Negative for chest pain.  Gastrointestinal: Negative for abdominal pain.  Musculoskeletal: Positive for gait problem and neck pain. Negative for back pain.  Skin: Positive for wound.  Neurological: Positive for headaches.  All other systems reviewed and are negative.   Physical  Exam Updated Vital Signs BP 126/67   Pulse 64   Temp (!) 97.4 F (36.3 C) (Oral)   Resp 16   Ht 5\' 11"  (1.803 m)   Wt 103.8 kg   SpO2 97%   BMI 31.91 kg/m   Physical Exam Vitals and nursing note reviewed.  Constitutional:      General: He is not in acute distress.    Appearance: Normal appearance. He is well-developed. He is not ill-appearing.     Comments: Chronically ill appearing male in NAD. In C-collar. Answers questions appropriately.  HENT:     Head: Normocephalic.     Comments: Jagged ~3cm laceration over the R forehead with blood oozing from wound Eyes:     General: No scleral icterus.       Right eye: No discharge.        Left eye: No discharge.     Conjunctiva/sclera: Conjunctivae normal.     Pupils: Pupils are equal, round, and reactive to light.  Cardiovascular:     Rate and Rhythm: Normal rate and regular rhythm.  Pulmonary:     Effort: Pulmonary effort is normal. No respiratory distress.     Breath sounds: Normal breath sounds.  Abdominal:     General: There is no distension.     Palpations: Abdomen is soft.     Tenderness: There is no abdominal tenderness.     Comments: Old ecchymosis over the right lower abdomen  Musculoskeletal:     Cervical back: Normal range of motion.  Skin:    General: Skin is warm and dry.  Neurological:     Mental Status: He is alert and oriented to person, place, and time.  Psychiatric:        Behavior: Behavior normal.     ED Results / Procedures / Treatments   Labs (all labs ordered are listed, but only abnormal results are displayed) Labs Reviewed - No data to display  EKG None  Radiology CT Head Wo Contrast  Result Date: 10/11/2019 CLINICAL DATA:  Status post trauma. EXAM: CT HEAD WITHOUT CONTRAST TECHNIQUE: Contiguous axial images were obtained from the base of the skull through the vertex without intravenous contrast. COMPARISON:  March 26, 2019 FINDINGS: Brain: There is mild cerebral atrophy with widening of  the extra-axial spaces and ventricular dilatation. There are areas of decreased attenuation within the white matter tracts of the supratentorial brain, consistent with microvascular disease changes. Vascular: No hyperdense vessel or unexpected calcification. Skull: Normal. Negative for fracture or focal lesion. Sinuses/Orbits: No acute finding. Other: None. IMPRESSION: No acute intracranial pathology. Electronically Signed   By: Virgina Norfolk M.D.   On: 10/11/2019 16:06   CT Cervical Spine Wo Contrast  Result Date: 10/11/2019 CLINICAL DATA:  Status post trauma. EXAM: CT CERVICAL SPINE WITHOUT CONTRAST TECHNIQUE: Multidetector CT imaging of the cervical spine was performed without intravenous contrast. Multiplanar CT image reconstructions were also  generated. COMPARISON:  April 05, 2019 FINDINGS: Alignment: Normal. Skull base and vertebrae: No acute fracture. No primary bone lesion or focal pathologic process. Soft tissues and spinal canal: No prevertebral fluid or swelling. No visible canal hematoma. Disc levels: C2-3: There is moderate severity end plate spondylosis. Mild disc space narrowing is seen. Bilateral facet hypertrophy is noted. Normal central canal and intervertebral neuroforamina. C3-4: There is mild end plate spondylosis. Mild disc space narrowing is seen. Bilateral facet hypertrophy is noted. Normal central canal and intervertebral neuroforamina. C4-5: There is moderate severity end plate spondylosis. Mild disc space narrowing is seen. Bilateral facet hypertrophy is noted. Normal central canal and intervertebral neuroforamina. C5-6: There is moderate severity end plate spondylosis. Moderate severity disc space narrowing is seen. Bilateral facet hypertrophy is noted. Normal central canal and intervertebral neuroforamina. C6-7: There is moderate severity end plate spondylosis. Moderate severity disc space narrowing is seen. Bilateral facet hypertrophy is noted. Normal central canal and  intervertebral neuroforamina. C7-T1: There is mild end plate spondylosis. Moderate severity disc space narrowing is seen. Bilateral facet hypertrophy is noted. Normal central canal and intervertebral neuroforamina. Upper chest: Negative. Other: None. IMPRESSION: 1. Multilevel degenerative changes, without evidence of an acute osseous abnormality. Electronically Signed   By: Aram Candela M.D.   On: 10/11/2019 16:09    Procedures .Marland KitchenLaceration Repair  Date/Time: 10/11/2019 4:33 PM Performed by: Bethel Born, PA-C Authorized by: Bethel Born, PA-C   Consent:    Consent obtained:  Verbal   Consent given by:  Patient   Risks discussed:  Infection and pain   Alternatives discussed:  No treatment Anesthesia (see MAR for exact dosages):    Anesthesia method:  Local infiltration   Local anesthetic:  Lidocaine 2% WITH epi Laceration details:    Location:  Face   Face location:  Forehead   Length (cm):  3   Depth (mm):  5 Repair type:    Repair type:  Simple Pre-procedure details:    Preparation:  Patient was prepped and draped in usual sterile fashion Exploration:    Wound exploration: wound explored through full range of motion and entire depth of wound probed and visualized     Wound extent: no underlying fracture noted   Treatment:    Area cleansed with:  Shur-Clens   Amount of cleaning:  Standard   Irrigation method:  Tap   Visualized foreign bodies/material removed: no   Skin repair:    Repair method:  Sutures   Suture size:  6-0   Suture material:  Prolene   Suture technique:  Simple interrupted   Number of sutures:  3 Approximation:    Approximation:  Close Post-procedure details:    Dressing:  Sterile dressing   Patient tolerance of procedure:  Tolerated well, no immediate complications   (including critical care time)    Medications Ordered in ED Medications  lidocaine-EPINEPHrine (XYLOCAINE W/EPI) 2 %-1:200000 (PF) injection (20 mLs  Given 10/11/19  1535)    ED Course  I have reviewed the triage vital signs and the nursing notes.  Pertinent labs & imaging results that were available during my care of the patient were reviewed by me and considered in my medical decision making (see chart for details).  64 year old male presents with a fall and head injury. Also complaining of mild neck pain. Vitals are stable. He is alert and oriented however due to underlying cognitive impairment cannot give a great history. He has a small lac on the R  side of his forehead which is deep enough to need stitches. Wound was irrigated and 3 sutures placed. He is UTD on tetanus per chart review. CT imaging is negative. Will d/c back to [facility]. Instructions for wound care and f/u instructions given.  MDM Rules/Calculators/A&P                       Final Clinical Impression(s) / ED Diagnoses Final diagnoses:  Fall, initial encounter  Injury of head, initial encounter  Neck pain    Rx / DC Orders ED Discharge Orders    None       Bethel Born, PA-C 10/11/19 1637    Bethel Born, PA-C 10/11/19 1638    Virgina Norfolk, DO 10/11/19 1758

## 2019-10-11 NOTE — ED Triage Notes (Addendum)
EMS states pt is [facility], unwitnessed fall with laceration rt side of head, bleeding controlled, pt denies LOC. 148/80-74-96% RA-18-T 99.9 Post neck tenderness, placed in C-collar during triage

## 2019-10-12 ENCOUNTER — Encounter: Payer: Self-pay | Admitting: Nurse Practitioner

## 2019-10-12 ENCOUNTER — Non-Acute Institutional Stay (SKILLED_NURSING_FACILITY): Payer: Medicare Other | Admitting: Nurse Practitioner

## 2019-10-12 DIAGNOSIS — S0083XD Contusion of other part of head, subsequent encounter: Secondary | ICD-10-CM

## 2019-10-12 DIAGNOSIS — G231 Progressive supranuclear ophthalmoplegia [Steele-Richardson-Olszewski]: Secondary | ICD-10-CM

## 2019-10-12 DIAGNOSIS — S0083XA Contusion of other part of head, initial encounter: Secondary | ICD-10-CM | POA: Insufficient documentation

## 2019-10-12 DIAGNOSIS — I10 Essential (primary) hypertension: Secondary | ICD-10-CM

## 2019-10-12 DIAGNOSIS — R4189 Other symptoms and signs involving cognitive functions and awareness: Secondary | ICD-10-CM

## 2019-10-12 DIAGNOSIS — R2681 Unsteadiness on feet: Secondary | ICD-10-CM

## 2019-10-12 DIAGNOSIS — R296 Repeated falls: Secondary | ICD-10-CM | POA: Diagnosis not present

## 2019-10-12 NOTE — Progress Notes (Signed)
Location:  [facility] Room Number: 4B Place of Service:  SNF (31) Provider:  Jazzlin Clements Johnney Ou, NP   Mahlon Gammon, MD  Patient Care Team: Mahlon Gammon, MD as PCP - General (Internal Medicine) Isaish Alemu X, NP as Nurse Practitioner (Internal Medicine)  Extended Emergency Contact Information Primary Emergency Contact: Nadel,Frank Address: [address]          [place], [place] [address] [place] Home Phone: [phone] Mobile Phone: [phone] Relation: Brother  Code Status:  DNR Goals of care: Advanced Directive information Advanced Directives 10/12/2019  Does Patient Have a Medical Advance Directive? Yes  Type of Advance Directive Out of facility DNR (pink MOST or yellow form)  Does patient want to make changes to medical advance directive? No - Patient declined  Copy of Healthcare Power of Attorney in Chart? -  Would patient like information on creating a medical advance directive? -  Pre-existing out of facility DNR order (yellow form or pink MOST form) Yellow form placed in chart (order not valid for inpatient use)     Chief Complaint  Patient presents with  . Acute Visit    Fall     HPI:  Pt is a 64 y.o. male seen today for an acute visit for 10/11/19 fall when the patient was found in floor beside w/c, resulted a laceration right forehead, sutured in ED-removed 5-7 days, right thumb bruise noted, ED head CT showed no acute intracranial pathology.  10/12/19 fall, scraped right knee when the patient was found in a sitting position beside bed, the patient stated he was attempting to transfer self, then slid and bumped right knee on w/c. Hx of dementia, resides in SNF [facility] for safety, care assistance. Hx of progressive supranuclear palsy, on Requip, Sinemet. The patient takes Donepezil 5mg  qd for memory. HTN, blood pressure is controlled on Metoprolol 25mg  qd, Lisinopril 10mg  qd.     Past Medical History:  Diagnosis Date  . Adult onset  fluency disorder   . Coronary artery disease    a. s/p stent left anterior descending artery remotely (per 2017 note, "about 4 years" prior). b. Normal nuc 05/2015.  . Diabetes mellitus type 2, controlled (HCC) 01/18/2016  . Dysphagia 2018  . Gait instability 2018  . History of falling   . Hypercholesterolemia   . Hypertension   . Memory change 2018  . Parkinson's disease (HCC) 2018  . Weight loss 2017   intentional   Past Surgical History:  Procedure Laterality Date  . CAROTID STENT  2012   in left anterior descending artery past EF 55%  . nuclear stress test  06/17/2015   Gated SPECT images reveal normal LV systolic function. EF 55%. Comparing the rest & stress SPECT images, no ischemia. Gated images are normal. Dr. 2018, MD 2013, 06/19/2015     Allergies  Allergen Reactions  . Codeine     Unknown     Outpatient Encounter Medications as of 10/12/2019  Medication Sig  . acetaminophen (TYLENOL) 325 MG tablet Take 650 mg by mouth every 4 (four) hours as needed. Administer 650mg  every four hours PRN for minor pain/headache x 48 hours. Notify MD of continued pain/headache. Do not exceed 3,000 mg in 24 hours. Document area of discomfort and effectiveness of medication.  Lacy Duverney aspirin 81 MG chewable tablet Chew 81 mg by mouth daily. 7am-10am.  . atorvastatin (LIPITOR) 10 MG tablet Take 10 mg by mouth daily. 7pm-10pm  . carbidopa-levodopa (SINEMET  IR) 25-100 MG tablet Take 2 tablets by mouth 4 (four) times daily. 7am,12pm,4pm,9:30pm  . donepezil (ARICEPT ODT) 5 MG disintegrating tablet Take 5 mg by mouth at bedtime. 7pm-10pm.  . lisinopril (PRINIVIL,ZESTRIL) 10 MG tablet Take 10 mg by mouth daily. 7am-10am.  . Metoprolol Succinate 25 MG CS24 Take 25 mg by mouth daily. 7am-10am  . rOPINIRole (REQUIP) 0.25 MG tablet Take 0.25 mg by mouth at bedtime. 7pm-10pm.  . sennosides-docusate sodium (SENOKOT-S) 8.6-50 MG tablet Take 1 tablet by mouth every other day. 8pm.   No  facility-administered encounter medications on file as of 10/12/2019.   ROS was provided with assistance of staff.  Review of Systems  Constitutional: Negative for activity change, appetite change, chills, diaphoresis, fatigue and fever.  HENT: Positive for hearing loss. Negative for congestion and voice change.   Eyes: Negative for visual disturbance.  Respiratory: Negative for cough and shortness of breath.   Cardiovascular: Negative for chest pain and leg swelling.  Gastrointestinal: Negative for abdominal distention, abdominal pain, constipation, diarrhea, nausea and vomiting.  Genitourinary: Negative for difficulty urinating, dysuria and urgency.  Musculoskeletal: Positive for gait problem.  Skin: Positive for wound.       Right forehead sutures intact, no active bleed or s/s of infection. The right thumb, right knee bruised.   Neurological: Negative for dizziness, speech difficulty, weakness and headaches.       Memory deficit.   Psychiatric/Behavioral: Negative for agitation, behavioral problems, hallucinations and sleep disturbance. The patient is not nervous/anxious.     Immunization History  Administered Date(s) Administered  . Influenza Whole 06/20/2018  . Influenza, High Dose Seasonal PF 06/20/2019  . Influenza-Unspecified 07/08/2017  . PFIZER SARS-COV-2 Vaccination 09/19/2019  . PPD Test 01/15/2017  . Pneumococcal Polysaccharide-23 08/05/2018  . Td 09/17/2016   Pertinent  Health Maintenance Due  Topic Date Due  . COLONOSCOPY  06/03/2004  . OPHTHALMOLOGY EXAM  10/08/2017  . FOOT EXAM  01/21/2018  . PNA vac Low Risk Adult (1 of 2 - PCV13) 08/06/2019  . HEMOGLOBIN A1C  12/07/2019  . INFLUENZA VACCINE  Completed   Fall Risk  01/21/2017  Falls in the past year? Yes  Number falls in past yr: 2 or more  Injury with Fall? Yes  Risk Factor Category  High Fall Risk  Risk for fall due to : History of fall(s);Impaired balance/gait;Impaired mobility  Follow up Falls  evaluation completed   Functional Status Survey:    Vitals:   10/12/19 1449  BP: 136/78  Pulse: 68  Resp: 18  Temp: (!) 96.4 F (35.8 C)  TempSrc: Oral  SpO2: 96%  Weight: 228 lb 12.8 oz (103.8 kg)  Height: 5\' 11"  (1.803 m)   Body mass index is 31.91 kg/m. Physical Exam Vitals and nursing note reviewed.  Constitutional:      General: He is not in acute distress.    Appearance: Normal appearance. He is not ill-appearing, toxic-appearing or diaphoretic.  HENT:     Head: Normocephalic and atraumatic.     Nose: Nose normal.     Mouth/Throat:     Mouth: Mucous membranes are moist.  Eyes:     Extraocular Movements: Extraocular movements intact.     Conjunctiva/sclera: Conjunctivae normal.     Pupils: Pupils are equal, round, and reactive to light.     Comments: Downward gaze  Cardiovascular:     Rate and Rhythm: Normal rate and regular rhythm.     Heart sounds: No murmur.  Pulmonary:  Breath sounds: No wheezing, rhonchi or rales.  Abdominal:     General: Bowel sounds are normal. There is no distension.     Palpations: Abdomen is soft.     Tenderness: There is no abdominal tenderness. There is no right CVA tenderness, left CVA tenderness, guarding or rebound.  Musculoskeletal:     Cervical back: Normal range of motion and neck supple.     Right lower leg: No edema.     Left lower leg: No edema.  Skin:    General: Skin is warm and dry.     Findings: Bruising present.     Comments: Right forehead sutures intact, no active bleed or s/s of infection. R thumb, knee bruise   Neurological:     General: No focal deficit present.     Mental Status: He is alert. Mental status is at baseline.     Motor: No weakness.     Coordination: Coordination abnormal.     Gait: Gait abnormal.     Comments: Oriented to person, place. Mild muscle rigidity in all limbs  Psychiatric:        Mood and Affect: Mood normal.        Behavior: Behavior normal.     Labs reviewed: Recent  Labs    03/27/19 0000 05/28/19 0000 06/30/19 0000  NA 141 141 139  K 4.1 4.0 4.1  CL  --  106  --   CO2  --  29  --   BUN 12 10 14   CREATININE 0.8 0.8 0.8  CALCIUM  --  8.8  --    Recent Labs    03/27/19 0000 05/28/19 0000 06/30/19 0000  AST 10* 10* 8*  ALT 15 4* 4*  ALKPHOS  --  94 116  PROT  --  5.9  --   ALBUMIN  --  3.7  --    Recent Labs    03/27/19 0000 05/28/19 0000 06/30/19 0000  WBC  --  6.4 6.4  NEUTROABS  --   --  3,514  HGB 13.0* 12.9* 12.9*  HCT 40* 41 40*  PLT 243 207 209   Lab Results  Component Value Date   TSH 2.51 03/27/2019   Lab Results  Component Value Date   HGBA1C 5.9 06/09/2019   Lab Results  Component Value Date   CHOL 77 06/09/2019   HDL 30 (A) 06/09/2019   LDLCALC 28 06/09/2019   TRIG 103 06/09/2019   CHOLHDL 3.1 08/04/2018    Significant Diagnostic Results in last 30 days:  CT Head Wo Contrast  Result Date: 10/11/2019 CLINICAL DATA:  Status post trauma. EXAM: CT HEAD WITHOUT CONTRAST TECHNIQUE: Contiguous axial images were obtained from the base of the skull through the vertex without intravenous contrast. COMPARISON:  March 26, 2019 FINDINGS: Brain: There is mild cerebral atrophy with widening of the extra-axial spaces and ventricular dilatation. There are areas of decreased attenuation within the white matter tracts of the supratentorial brain, consistent with microvascular disease changes. Vascular: No hyperdense vessel or unexpected calcification. Skull: Normal. Negative for fracture or focal lesion. Sinuses/Orbits: No acute finding. Other: None. IMPRESSION: No acute intracranial pathology. Electronically Signed   By: Virgina Norfolk M.D.   On: 10/11/2019 16:06   CT Cervical Spine Wo Contrast  Result Date: 10/11/2019 CLINICAL DATA:  Status post trauma. EXAM: CT CERVICAL SPINE WITHOUT CONTRAST TECHNIQUE: Multidetector CT imaging of the cervical spine was performed without intravenous contrast. Multiplanar CT image  reconstructions were also generated. COMPARISON:  April 05, 2019 FINDINGS: Alignment: Normal. Skull base and vertebrae: No acute fracture. No primary bone lesion or focal pathologic process. Soft tissues and spinal canal: No prevertebral fluid or swelling. No visible canal hematoma. Disc levels: C2-3: There is moderate severity end plate spondylosis. Mild disc space narrowing is seen. Bilateral facet hypertrophy is noted. Normal central canal and intervertebral neuroforamina. C3-4: There is mild end plate spondylosis. Mild disc space narrowing is seen. Bilateral facet hypertrophy is noted. Normal central canal and intervertebral neuroforamina. C4-5: There is moderate severity end plate spondylosis. Mild disc space narrowing is seen. Bilateral facet hypertrophy is noted. Normal central canal and intervertebral neuroforamina. C5-6: There is moderate severity end plate spondylosis. Moderate severity disc space narrowing is seen. Bilateral facet hypertrophy is noted. Normal central canal and intervertebral neuroforamina. C6-7: There is moderate severity end plate spondylosis. Moderate severity disc space narrowing is seen. Bilateral facet hypertrophy is noted. Normal central canal and intervertebral neuroforamina. C7-T1: There is mild end plate spondylosis. Moderate severity disc space narrowing is seen. Bilateral facet hypertrophy is noted. Normal central canal and intervertebral neuroforamina. Upper chest: Negative. Other: None. IMPRESSION: 1. Multilevel degenerative changes, without evidence of an acute osseous abnormality. Electronically Signed   By: Aram Candela M.D.   On: 10/11/2019 16:09    Assessment/Plan Recurrent falls 10/11/19 fall when the patient was found in floor beside w/c, resulted a laceration right forehead, sutured in ED-removed 5-7 days, right thumb bruise noted, ED head CT showed no acute intracranial pathology.  10/12/19 fall, scraped right knee when the patient was found in a sitting  position beside bed, the patient stated he was attempting to transfer self, then slid and bumped right knee on w/c. Frequent falling due to lack of safety awareness, unsteady gait, progression of progressive supranuclear palsy. Close supervision/assistance for safety, care assistance.   Forehead contusion 10/11/19 fall when the patient was found in floor beside w/c, resulted a laceration right forehead, sutured in ED-removed 5-7 days, right thumb bruise noted, ED head CT showed no acute intracranial pathology.  10/12/19 continue to observe the patient    Unsteady gait The patient needs assistance for transfer, w/c for mobility, close supervision/assistance for safety, care assistance in SNF [facility]  Progressive supranuclear palsy (HCC) Frequent falling, unsteady gait, cognitive deficit, continue Sinemet, Requip, close supervision/assistance for safety.   Hypertension Blood pressure is controlled, continue Metoprolol, Lisinopril.   Cognitive impairment Continue close supervision/assistance for safety, care assistance, continue Donepezil for memory.      Family/ staff Communication: plan of care reviewed with the patient and charge nurse.   Labs/tests ordered:  None  Time spend 25 minutes.

## 2019-10-12 NOTE — Assessment & Plan Note (Signed)
Continue close supervision/assistance for safety, care assistance, continue Donepezil for memory.

## 2019-10-12 NOTE — Assessment & Plan Note (Signed)
Frequent falling, unsteady gait, cognitive deficit, continue Sinemet, Requip, close supervision/assistance for safety.

## 2019-10-12 NOTE — Assessment & Plan Note (Signed)
The patient needs assistance for transfer, w/c for mobility, close supervision/assistance for safety, care assistance in SNF [facility]

## 2019-10-12 NOTE — Assessment & Plan Note (Signed)
Blood pressure is controlled, continue Metoprolol, Lisinopril.  

## 2019-10-12 NOTE — Assessment & Plan Note (Addendum)
10/11/19 fall when the patient was found in floor beside w/c, resulted a laceration right forehead, sutured in ED-removed 5-7 days, right thumb bruise noted, ED head CT showed no acute intracranial pathology.  10/12/19 fall, scraped right knee when the patient was found in a sitting position beside bed, the patient stated he was attempting to transfer self, then slid and bumped right knee on w/c. Frequent falling due to lack of safety awareness, unsteady gait, progression of progressive supranuclear palsy. Close supervision/assistance for safety, care assistance.

## 2019-10-12 NOTE — Assessment & Plan Note (Signed)
10/11/19 fall when the patient was found in floor beside w/c, resulted a laceration right forehead, sutured in ED-removed 5-7 days, right thumb bruise noted, ED head CT showed no acute intracranial pathology.  10/12/19 continue to observe the patient

## 2019-10-14 ENCOUNTER — Non-Acute Institutional Stay (SKILLED_NURSING_FACILITY): Payer: Medicare Other | Admitting: Nurse Practitioner

## 2019-10-14 ENCOUNTER — Encounter: Payer: Self-pay | Admitting: Nurse Practitioner

## 2019-10-14 DIAGNOSIS — I10 Essential (primary) hypertension: Secondary | ICD-10-CM | POA: Diagnosis not present

## 2019-10-14 DIAGNOSIS — R4189 Other symptoms and signs involving cognitive functions and awareness: Secondary | ICD-10-CM | POA: Diagnosis not present

## 2019-10-14 DIAGNOSIS — G231 Progressive supranuclear ophthalmoplegia [Steele-Richardson-Olszewski]: Secondary | ICD-10-CM

## 2019-10-14 DIAGNOSIS — R2681 Unsteadiness on feet: Secondary | ICD-10-CM

## 2019-10-14 DIAGNOSIS — R296 Repeated falls: Secondary | ICD-10-CM

## 2019-10-14 LAB — BASIC METABOLIC PANEL
BUN: 14 (ref 4–21)
CO2: 24 — AB (ref 13–22)
Chloride: 106 (ref 99–108)
Creatinine: 0.7 (ref 0.6–1.3)
Glucose: 97
Potassium: 3.8 (ref 3.4–5.3)
Sodium: 141 (ref 137–147)

## 2019-10-14 LAB — LIPID PANEL
Cholesterol: 27 (ref 0–200)
HDL: 27 — AB (ref 35–70)
LDL Cholesterol: 43
Triglycerides: 134 (ref 40–160)

## 2019-10-14 LAB — COMPREHENSIVE METABOLIC PANEL
Calcium: 8.6 — AB (ref 8.7–10.7)
GFR calc Af Amer: 115
GFR calc non Af Amer: 100

## 2019-10-14 LAB — CBC AND DIFFERENTIAL
HCT: 38 — AB (ref 41–53)
Hemoglobin: 12.5 — AB (ref 13.5–17.5)
Platelets: 219 (ref 150–399)
WBC: 6.6

## 2019-10-14 LAB — CBC: RBC: 4.23 (ref 3.87–5.11)

## 2019-10-14 LAB — HEMOGLOBIN A1C: Hemoglobin A1C: 5.8

## 2019-10-14 NOTE — Assessment & Plan Note (Addendum)
10/13/19 fall when the patient was found on floor on his knees. 10/13/19 Hgb a1c 5.8, Na 141, K 3.8, bun 14, creat 0.69, eGFR 100, cholesterol 92, HDL 27, triglycerides 134, LDL 43, wbc 6.6, Hgb 12.5, plt 219, neutrophil 60.9 10/16/19 Dr. Sallyanne Kuster dc Lisinopril, call if Sbp>170. Poor safety awareness, progression of balance issue are contributory, close supervision for safety.

## 2019-10-14 NOTE — Progress Notes (Signed)
Location:   [facility] Room Number: 4 Place of Service:  SNF (31) Provider:  Ianna Salmela, NP  [name], MD  Patient Care Team: [name], MD as PCP - General (Internal Medicine) Mauriah Mcmillen X, NP as Nurse Practitioner (Internal Medicine)  Extended Emergency Contact Information Primary Emergency Contact: Peavy,Frank Address: [place], [place] [address] [address] of [place] Phone: [phone] Mobile Phone: [phone] Relation: Brother  Code Status:  DNR Goals of care: Advanced Directive information Advanced Directives 10/14/2019  Does Patient Have a Medical Advance Directive? Yes  Type of Paramedic of [place];Out of facility DNR (pink MOST or yellow form)  Does patient want to make changes to medical advance directive? No - Patient declined  Copy of [place] in Chart? Yes - validated most recent copy scanned in chart (See row information)  Would patient like information on creating a medical advance directive? -  Pre-existing out of facility DNR order (yellow form or pink MOST form) Pink MOST/Yellow Form most recent copy in chart - Physician notified to receive inpatient order     Chief Complaint  Patient presents with  . Acute Visit    Fall    HPI:  Pt is a 64 y.o. male seen today for an acute visit for the patient 10/13/19 fall when the patient was found on floor on his knees. 10/13/19 Hgb a1c 5.8, Na 141, K 3.8, bun 14, creat 0.69, eGFR 100, cholesterol 92, HDL 27, triglycerides 134, LDL 43, wbc 6.6, Hgb 12.5, plt 219, neutrophil 60.9 10/16/19 Dr. Sallyanne Kuster dc Lisinopril, call if Sbp>170. Hx of progressive supranuclear palsy, progressing, on Sinemet and Requip. The patient resides in SNF [facility] for safety, care assistance, on Donepezil '5mg'$  qd for memory.    Past Medical History:  Diagnosis Date  . Adult onset fluency disorder   . Coronary artery disease    a. s/p stent  left anterior descending artery remotely (per 2017 note, "about 4 years" prior). b. Normal nuc 05/2015.  . Diabetes mellitus type 2, controlled ([place]) 01/18/2016  . Dysphagia 2018  . Gait instability 2018  . History of falling   . Hypercholesterolemia   . Hypertension   . Memory change 2018  . Parkinson's disease ([place]) 2018  . Weight loss 2017   intentional   Past Surgical History:  Procedure Laterality Date  . CAROTID STENT  2012   in left anterior descending artery past EF 55%  . nuclear stress test  06/17/2015   Gated SPECT images reveal normal LV systolic function. EF 55%. Comparing the rest & stress SPECT images, no ischemia. Gated images are normal. Dr. [name], MD [name], [name]     Allergies  Allergen Reactions  . Codeine     Unknown     Allergies as of 10/14/2019      Reactions   Codeine    Unknown       Medication List       Accurate as of October 14, 2019 11:59 PM. If you have any questions, ask your nurse or doctor.        acetaminophen 325 MG tablet Commonly known as: TYLENOL Take 650 mg by mouth every 4 (four) hours as needed for mild pain or headache (x 48 hours notify MD of continued pain/headache. Do not exceed 3,000 mg in 24 hours).   aspirin 81 MG chewable tablet Chew 81  mg by mouth daily. 7am-10am.   atorvastatin 10 MG tablet Commonly known as: LIPITOR Take 10 mg by mouth daily. 7pm-10pm   carbidopa-levodopa 25-100 MG tablet Commonly known as: SINEMET IR Take 2 tablets by mouth 4 (four) times daily. 7am,12pm,4pm,9:30pm   donepezil 5 MG tablet Commonly known as: ARICEPT Take 5 mg by mouth at bedtime. For parkinson's disease   lisinopril 10 MG tablet Commonly known as: ZESTRIL Take 10 mg by mouth daily. 7am-10am.   Metoprolol Succinate 25 MG Cs24 Take 25 mg by mouth daily. 7am-10am   rOPINIRole 0.25 MG tablet Commonly known as: REQUIP Take 0.25 mg by mouth at bedtime. 7pm-10pm.   sennosides-docusate sodium 8.6-50 MG  tablet Commonly known as: SENOKOT-S Take 1 tablet by mouth every other day. 8pm.      ROS was provided with assistance of staff.  Review of Systems  Constitutional: Negative for activity change, appetite change, chills, diaphoresis, fatigue and fever.  HENT: Negative for congestion and voice change.   Eyes: Negative for visual disturbance.  Respiratory: Negative for cough, shortness of breath and wheezing.   Cardiovascular: Negative for chest pain, palpitations and leg swelling.  Gastrointestinal: Negative for abdominal distention, abdominal pain, constipation, diarrhea, nausea and vomiting.  Genitourinary: Negative for difficulty urinating, dysuria and urgency.  Musculoskeletal: Positive for gait problem.  Skin: Negative for color change and pallor.  Neurological: Negative for dizziness, speech difficulty, weakness and headaches.       Memory lapses.   Psychiatric/Behavioral: Negative for agitation, behavioral problems, hallucinations and sleep disturbance. The patient is not nervous/anxious.     Immunization History  Administered Date(s) Administered  . Influenza Whole 06/20/2018  . Influenza, High Dose Seasonal PF 06/20/2019  . Influenza-Unspecified 07/08/2017  . PFIZER SARS-COV-2 Vaccination 09/19/2019  . PPD Test 01/15/2017  . Pneumococcal Polysaccharide-23 08/05/2018  . Td 09/17/2016   Pertinent  Health Maintenance Due  Topic Date Due  . URINE MICROALBUMIN  06/03/1964  . COLONOSCOPY  06/03/2004  . OPHTHALMOLOGY EXAM  10/08/2017  . FOOT EXAM  01/21/2018  . PNA vac Low Risk Adult (1 of 2 - PCV13) 08/06/2019  . HEMOGLOBIN A1C  04/12/2020  . INFLUENZA VACCINE  Completed   Fall Risk  01/21/2017  Falls in the past year? Yes  Number falls in past yr: 2 or more  Injury with Fall? Yes  Risk Factor Category  High Fall Risk  Risk for fall due to : History of fall(s);Impaired balance/gait;Impaired mobility  Follow up Falls evaluation completed   Functional Status Survey:     Vitals:   10/14/19 1617  BP: 132/66  Pulse: 65  Resp: 18  Temp: (!) 97.5 F (36.4 C)  SpO2: 95%  Weight: 228 lb 12.8 oz (103.8 kg)  Height: '5\' 11"'$  (1.803 m)   Body mass index is 31.91 kg/m. Physical Exam Vitals and nursing note reviewed.  Constitutional:      General: He is not in acute distress.    Appearance: Normal appearance. He is not ill-appearing, toxic-appearing or diaphoretic.  HENT:     Head: Normocephalic and atraumatic.     Nose: Nose normal.     Mouth/Throat:     Mouth: Mucous membranes are moist.  Eyes:     Extraocular Movements: Extraocular movements intact.     Conjunctiva/sclera: Conjunctivae normal.     Pupils: Pupils are equal, round, and reactive to light.     Comments: Downward gaze  Cardiovascular:     Rate and Rhythm: Normal rate and regular rhythm.  Heart sounds: No murmur.  Pulmonary:     Breath sounds: No wheezing, rhonchi or rales.  Abdominal:     General: Bowel sounds are normal. There is no distension.     Palpations: Abdomen is soft.     Tenderness: There is no abdominal tenderness. There is no right CVA tenderness, left CVA tenderness, guarding or rebound.  Musculoskeletal:     Cervical back: Normal range of motion and neck supple.     Right lower leg: No edema.     Left lower leg: No edema.  Skin:    General: Skin is warm and dry.  Neurological:     General: No focal deficit present.     Mental Status: He is alert. Mental status is at baseline.     Motor: No weakness.     Coordination: Coordination abnormal.     Gait: Gait abnormal.     Comments: Oriented to person, place. Poor balance, lack of safety awareness, increased gait issue, moves slow.   Psychiatric:        Mood and Affect: Mood normal.        Behavior: Behavior normal.        Thought Content: Thought content normal.     Labs reviewed: Recent Labs    05/28/19 0000 06/30/19 0000 10/14/19 0000  NA 141 139 141  K 4.0 4.1 3.8  CL 106  --  106  CO2 29  --   24*  BUN '10 14 14  '$ CREATININE 0.8 0.8 0.7  CALCIUM 8.8  --  8.6*   Recent Labs    03/27/19 0000 05/28/19 0000 06/30/19 0000  AST 10* 10* 8*  ALT 15 4* 4*  ALKPHOS  --  94 116  PROT  --  5.9  --   ALBUMIN  --  3.7  --    Recent Labs    05/28/19 0000 06/30/19 0000 10/14/19 0000  WBC 6.4 6.4 6.6  NEUTROABS  --  3,514  --   HGB 12.9* 12.9* 12.5*  HCT 41 40* 38*  PLT 207 209 219   Lab Results  Component Value Date   TSH 2.51 03/27/2019   Lab Results  Component Value Date   HGBA1C 5.8 10/14/2019   Lab Results  Component Value Date   CHOL 27 10/14/2019   HDL 30 (A) 06/09/2019   LDLCALC 28 06/09/2019   TRIG 134 10/14/2019   CHOLHDL 3.1 08/04/2018    Significant Diagnostic Results in last 30 days:  CT Head Wo Contrast  Result Date: 10/11/2019 CLINICAL DATA:  Status post trauma. EXAM: CT HEAD WITHOUT CONTRAST TECHNIQUE: Contiguous axial images were obtained from the base of the skull through the vertex without intravenous contrast. COMPARISON:  March 26, 2019 FINDINGS: Brain: There is mild cerebral atrophy with widening of the extra-axial spaces and ventricular dilatation. There are areas of decreased attenuation within the white matter tracts of the supratentorial brain, consistent with microvascular disease changes. Vascular: No hyperdense vessel or unexpected calcification. Skull: Normal. Negative for fracture or focal lesion. Sinuses/Orbits: No acute finding. Other: None. IMPRESSION: No acute intracranial pathology. Electronically Signed   By: Virgina Norfolk M.D.   On: 10/11/2019 16:06   CT Cervical Spine Wo Contrast  Result Date: 10/11/2019 CLINICAL DATA:  Status post trauma. EXAM: CT CERVICAL SPINE WITHOUT CONTRAST TECHNIQUE: Multidetector CT imaging of the cervical spine was performed without intravenous contrast. Multiplanar CT image reconstructions were also generated. COMPARISON:  April 05, 2019 FINDINGS: Alignment: Normal. Skull base  and vertebrae: No acute  fracture. No primary bone lesion or focal pathologic process. Soft tissues and spinal canal: No prevertebral fluid or swelling. No visible canal hematoma. Disc levels: C2-3: There is moderate severity end plate spondylosis. Mild disc space narrowing is seen. Bilateral facet hypertrophy is noted. Normal central canal and intervertebral neuroforamina. C3-4: There is mild end plate spondylosis. Mild disc space narrowing is seen. Bilateral facet hypertrophy is noted. Normal central canal and intervertebral neuroforamina. C4-5: There is moderate severity end plate spondylosis. Mild disc space narrowing is seen. Bilateral facet hypertrophy is noted. Normal central canal and intervertebral neuroforamina. C5-6: There is moderate severity end plate spondylosis. Moderate severity disc space narrowing is seen. Bilateral facet hypertrophy is noted. Normal central canal and intervertebral neuroforamina. C6-7: There is moderate severity end plate spondylosis. Moderate severity disc space narrowing is seen. Bilateral facet hypertrophy is noted. Normal central canal and intervertebral neuroforamina. C7-T1: There is mild end plate spondylosis. Moderate severity disc space narrowing is seen. Bilateral facet hypertrophy is noted. Normal central canal and intervertebral neuroforamina. Upper chest: Negative. Other: None. IMPRESSION: 1. Multilevel degenerative changes, without evidence of an acute osseous abnormality. Electronically Signed   By: Virgina Norfolk M.D.   On: 10/11/2019 16:09    Assessment/Plan Recurrent falls 10/13/19 fall when the patient was found on floor on his knees. 10/13/19 Hgb a1c 5.8, Na 141, K 3.8, bun 14, creat 0.69, eGFR 100, cholesterol 92, HDL 27, triglycerides 134, LDL 43, wbc 6.6, Hgb 12.5, plt 219, neutrophil 60.9 10/16/19 Dr. Sallyanne Kuster dc Lisinopril, call if Sbp>170. Poor safety awareness, progression of balance issue are contributory, close supervision for safety.   Hypertension 10/16/19 Dr.  Sallyanne Kuster dc Lisinopril, call if Sbp>170, continue Metoprolol.   Progressive supranuclear palsy ([place]) Continue SNF [facility] for safety, care assistance, continue Sinemet, Requip,  close supervision for safety.   Cognitive impairment Continue SNF [facility] for safety, care assistance, continue Donepezil for memory, close supervision for safety.   Unsteady gait Progressing, frequent falling, the patient needs SBA/assistance with transferring, w/c for mobility.      Family/ staff Communication: plan of care reviewed with the patient and charge nurse.   Labs/tests ordered:  none  Time spend 25 minutes.

## 2019-10-16 ENCOUNTER — Telehealth (INDEPENDENT_AMBULATORY_CARE_PROVIDER_SITE_OTHER): Payer: Medicare Other | Admitting: Cardiovascular Disease

## 2019-10-16 ENCOUNTER — Encounter: Payer: Self-pay | Admitting: Cardiovascular Disease

## 2019-10-16 ENCOUNTER — Telehealth: Payer: Self-pay | Admitting: *Deleted

## 2019-10-16 VITALS — BP 134/72 | HR 73 | Temp 97.1°F | Resp 18 | Ht 69.0 in | Wt 230.0 lb

## 2019-10-16 DIAGNOSIS — I2510 Atherosclerotic heart disease of native coronary artery without angina pectoris: Secondary | ICD-10-CM

## 2019-10-16 DIAGNOSIS — E785 Hyperlipidemia, unspecified: Secondary | ICD-10-CM

## 2019-10-16 DIAGNOSIS — I10 Essential (primary) hypertension: Secondary | ICD-10-CM | POA: Diagnosis not present

## 2019-10-16 DIAGNOSIS — R296 Repeated falls: Secondary | ICD-10-CM

## 2019-10-16 DIAGNOSIS — E669 Obesity, unspecified: Secondary | ICD-10-CM | POA: Diagnosis not present

## 2019-10-16 NOTE — Assessment & Plan Note (Addendum)
Continue SNF [facility] for safety, care assistance, continue Sinemet, Requip,  close supervision for safety.

## 2019-10-16 NOTE — Patient Instructions (Signed)
Medication Instructions:  STOP the Lisinopril. Call the office if your systolic blood pressure (the top number) gets above 170 consistently.  *If you need a refill on your cardiac medications before your next appointment, please call your pharmacy*  Lab Work: None ordered If you have labs (blood work) drawn today and your tests are completely normal, you will receive your results only by: [name] MyChart Message (if you have MyChart) OR . A paper copy in the mail If you have any lab test that is abnormal or we need to change your treatment, we will call you to review the results.  Testing/Procedures: None ordered  Follow-Up: At [facility], you and your health needs are our priority.  As part of our continuing mission to provide you with exceptional heart care, we have created designated Provider Care Teams.  These Care Teams include your primary Cardiologist (physician) and Advanced Practice Providers (APPs -  Physician Assistants and Nurse Practitioners) who all work together to provide you with the care you need, when you need it.  Your next appointment:   6 month(s)  The format for your next appointment:   In Person  Provider:   You may see Thurmon Fair, MD or one of the following Advanced Practice Providers on your designated Care Team:    Azalee Course, PA-C  Micah Flesher, PA-C or   Judy Pimple, [place]

## 2019-10-16 NOTE — Progress Notes (Signed)
Virtual Visit via Telephone Note   This visit type was conducted due to national recommendations for restrictions regarding the COVID-19 Pandemic (e.g. social distancing) in an effort to limit this patient's exposure and mitigate transmission in our community.  Due to his co-morbid illnesses, this patient is at least at moderate risk for complications without adequate follow up.  This format is felt to be most appropriate for this patient at this time.  The patient did not have access to video technology/had technical difficulties with video requiring transitioning to audio format only (telephone).  All issues noted in this document were discussed and addressed.  No physical exam could be performed with this format.  Please refer to the patient's chart for his  consent to telehealth for [facility].   Date:  10/16/2019   ID:  Alan Wells, DOB 1956-07-13, MRN [id number]  Patient Location: Skilled Nursing Facility Provider Location: Home  PCP:  Mahlon Gammon, MD  Cardiologist:   Arilynn Blakeney Electrophysiologist:  None   Evaluation Performed:  Follow-Up Visit  Chief Complaint:  CAD  History of Present Illness:    Alan Wells is a 64 y.o. 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  He continues to have frequent falls.  Just a few days ago he had to go to the emergency room to get stitches for a scalp laceration.  I tried to speak with Alan Wells on the phone, but he really was unable to hear me clearly or respond to my questions.  We obtained most of the information from his nurse and from his brother Alan Wells.  It appears he has not had any chest pain or trouble breathing.  He is extremely sedentary and can only get up with assistance.  Despite this he continues to fall.  Vital signs recorded during multiple medical visits over the  last 3 months show that his systolic blood pressure is typically around 130/70.  I suspect that most if not all these blood pressure readings are with the patient supine or sitting.  Heart rate is typically in the mid 60s.  It is difficult to be sure, but it is possible that his falls are related to some degree of orthostatic hypotension.  Dr. Urban Gibson note documents that "he has been checked for postural hypotension many times but he has been negative".  He has received his first COVID-19 vaccine dose.  His brother Alan Wells has not been able to physically visit Alan Wells, but he has visited him on the other side of the nursing home window.  The patient does not have symptoms concerning for COVID-19 infection (fever, chills, cough, or new shortness of breath).    Past Medical History:  Diagnosis Date  . Adult onset fluency disorder   . Coronary artery disease    a. s/p stent left anterior descending artery remotely (per 2017 note, "about 4 years" prior). b. Normal nuc 05/2015.  . Diabetes mellitus type 2, controlled ([facility]) 01/18/2016  . Dysphagia 2018  . Gait instability 2018  . History of falling   . Hypercholesterolemia   . Hypertension   . Memory change 2018  . Parkinson's disease ([facility]) 2018  . Weight loss 2017   intentional   Past Surgical History:  Procedure Laterality Date  . CAROTID STENT  2012   in left anterior descending artery past EF 55%  . nuclear stress test  06/17/2015  Gated SPECT images reveal normal LV systolic function. EF 55%. Comparing the rest & stress SPECT images, no ischemia. Gated images are normal. Dr. Vernell Barrier, MD [place], [place]      Current Meds  Medication Sig  . acetaminophen (TYLENOL) 325 MG tablet Take 650 mg by mouth every 4 (four) hours as needed for mild pain or headache (x 48 hours notify MD of continued pain/headache. Do not exceed 3,000 mg in 24 hours).  [name] aspirin 81 MG chewable tablet Chew 81 mg by mouth daily. 7am-10am.  . atorvastatin  (LIPITOR) 10 MG tablet Take 10 mg by mouth daily. 7pm-10pm  . carbidopa-levodopa (SINEMET IR) 25-100 MG tablet Take 2 tablets by mouth 4 (four) times daily. 7am,12pm,4pm,9:30pm  . donepezil (ARICEPT) 5 MG tablet Take 5 mg by mouth at bedtime. For parkinson's disease  . Metoprolol Succinate 25 MG CS24 Take 25 mg by mouth daily. 7am-10am  . rOPINIRole (REQUIP) 0.25 MG tablet Take 0.25 mg by mouth at bedtime. 7pm-10pm.  . sennosides-docusate sodium (SENOKOT-S) 8.6-50 MG tablet Take 1 tablet by mouth every other day. 8pm.  . [DISCONTINUED] lisinopril (PRINIVIL,ZESTRIL) 10 MG tablet Take 10 mg by mouth daily. 7am-10am.     Allergies:   Codeine   Social History   Tobacco Use  . Smoking status: Never Smoker  . Smokeless tobacco: Never Used  Substance Use Topics  . Alcohol use: No  . Drug use: No     Family Hx: The patient's family history includes Cancer in his mother; Heart disease in his mother.  ROS:   Please see the history of present illness.     All other systems reviewed and are negative.   Prior CV studies:   The following studies were reviewed today:   Labs/Other Tests and Data Reviewed:    EKG:  No ECG reviewed.  Recent Labs: 03/27/2019: TSH 2.51 06/30/2019: ALT 4 10/14/2019: BUN 14; Creatinine 0.7; Hemoglobin 12.5; Platelets 219; Potassium 3.8; Sodium 141   Recent Lipid Panel Lab Results  Component Value Date/Time   CHOL 27 10/14/2019 12:00 AM   TRIG 134 10/14/2019 12:00 AM   HDL 30 (A) 06/09/2019 12:00 AM   CHOLHDL 3.1 08/04/2018 05:59 AM   LDLCALC 28 06/09/2019 12:00 AM    Wt Readings from Last 3 Encounters:  10/16/19 230 lb (104.3 kg)  10/14/19 228 lb 12.8 oz (103.8 kg)  10/12/19 228 lb 12.8 oz (103.8 kg)     Objective:    Vital Signs:  BP 134/72   Pulse 73   Temp (!) 97.1 F (36.2 C)   Resp 18   Ht 5\' 9"  (1.753 m)   Wt 230 lb (104.3 kg)   SpO2 94%   BMI 33.97 kg/m    VITAL SIGNS:  reviewed Unable to examine  ASSESSMENT & PLAN:     1. CAD: Asymptomatic.  Very sedentary.  All risk factors appear appropriately addressed.  Had a low risk nuclear stress test in 2016. 2. Falls: Despite the fact that we have not been able to document it, I am still concerned that he may have orthostatic hypotension.  We will stop his lisinopril.  I would tolerate systolic blood pressure in the 160s without intervention with medication.  I would only revisit this if he develops angina pectoris as a result of his high blood pressure. 3. Dyslipidemia: Most recent total cholesterol was 27, although he has not lost any weight.  Just a few months ago total cholesterol was 77 with both low LDL  and low HDL.  He is on a low-dose of atorvastatin.  COVID-19 Education: The signs and symptoms of COVID-19 were discussed with the patient and how to seek care for testing (follow up with PCP or arrange E-visit).  The importance of social distancing was discussed today.  Time:   Today, I have spent 16 minutes with the patient and his caregivers with telehealth technology discussing the above problems.     Medication Adjustments/Labs and Tests Ordered: Current medicines are reviewed at length with the patient today.  Concerns regarding medicines are outlined above.   Tests Ordered: No orders of the defined types were placed in this encounter.   Medication Changes: No orders of the defined types were placed in this encounter.   Follow Up:  In Person 6 months  Signed, Sanda Klein, MD  10/16/2019 10:29 AM    [place]

## 2019-10-16 NOTE — Assessment & Plan Note (Signed)
10/16/19 Dr. Royann Shivers dc Lisinopril, call if Sbp>170, continue Metoprolol.

## 2019-10-16 NOTE — Telephone Encounter (Signed)
The patient's nurse has been called about the virtual appointment today with Dr. Royann Shivers. Instructions provided. The AVS will be mailed. The patient and the nurse have verbalized their understanding.

## 2019-10-16 NOTE — Assessment & Plan Note (Signed)
Continue SNF [facility] for safety, care assistance, continue Donepezil for memory, close supervision for safety.

## 2019-10-20 ENCOUNTER — Encounter: Payer: Self-pay | Admitting: Nurse Practitioner

## 2019-10-20 NOTE — Assessment & Plan Note (Signed)
Progressing, frequent falling, the patient needs SBA/assistance with transferring, w/c for mobility.

## 2019-10-21 ENCOUNTER — Non-Acute Institutional Stay (SKILLED_NURSING_FACILITY): Payer: Medicare Other | Admitting: Nurse Practitioner

## 2019-10-21 ENCOUNTER — Encounter: Payer: Self-pay | Admitting: Nurse Practitioner

## 2019-10-21 DIAGNOSIS — G231 Progressive supranuclear ophthalmoplegia [Steele-Richardson-Olszewski]: Secondary | ICD-10-CM

## 2019-10-21 DIAGNOSIS — S0003XA Contusion of scalp, initial encounter: Secondary | ICD-10-CM | POA: Diagnosis not present

## 2019-10-21 DIAGNOSIS — K5900 Constipation, unspecified: Secondary | ICD-10-CM | POA: Diagnosis not present

## 2019-10-21 DIAGNOSIS — I10 Essential (primary) hypertension: Secondary | ICD-10-CM

## 2019-10-21 DIAGNOSIS — S0083XD Contusion of other part of head, subsequent encounter: Secondary | ICD-10-CM

## 2019-10-21 DIAGNOSIS — R296 Repeated falls: Secondary | ICD-10-CM | POA: Diagnosis not present

## 2019-10-21 DIAGNOSIS — R2681 Unsteadiness on feet: Secondary | ICD-10-CM

## 2019-10-21 DIAGNOSIS — R4189 Other symptoms and signs involving cognitive functions and awareness: Secondary | ICD-10-CM

## 2019-10-21 NOTE — Assessment & Plan Note (Signed)
The right forehead previous injury is scabbed over, healing nicely.

## 2019-10-21 NOTE — Assessment & Plan Note (Addendum)
Lack of safety awareness, increased balance/gait issue, close supervision/assistance for safety. Continue Donepezil.

## 2019-10-21 NOTE — Assessment & Plan Note (Signed)
The patient needs supervision/assistance for transfer, w/c for mobility, continue SNF [facility] for safety, care assistance.

## 2019-10-21 NOTE — Assessment & Plan Note (Signed)
Progressing, close supervision/assistance for safety, continue Sinemet, Requip, f/u neurology.

## 2019-10-21 NOTE — Assessment & Plan Note (Signed)
Stable, continue Senokot S 

## 2019-10-21 NOTE — Assessment & Plan Note (Signed)
10/20/19 fall again when the patient was found sitting upright in the floor, resulted right parietal/occiput abrasion.

## 2019-10-21 NOTE — Progress Notes (Signed)
Location:   [facility] Room Number: 4 Place of Service:  SNF (31) Provider:  Diamante Truszkowski NP  Mahlon Gammon, MD  Patient Care Team: Mahlon Gammon, MD as PCP - General (Internal Medicine) Mellanie Bejarano X, NP as Nurse Practitioner (Internal Medicine)  Extended Emergency Contact Information Primary Emergency Contact: Pridmore,Frank Address: [address]          [place], [place] [address] [place] of [place] Home Phone: [phone] Mobile Phone: [phone] Relation: Brother  Code Status:  DNR Goals of care: Advanced Directive information Advanced Directives 10/21/2019  Does Patient Have a Medical Advance Directive? Yes  Type of Advance Directive Out of facility DNR (pink MOST or yellow form);Healthcare Power of Attorney  Does patient want to make changes to medical advance directive? No - Patient declined  Copy of Healthcare Power of Attorney in Chart? Yes - validated most recent copy scanned in chart (See row information)  Would patient like information on creating a medical advance directive? -  Pre-existing out of facility DNR order (yellow form or pink MOST form) Yellow form placed in chart (order not valid for inpatient use)     Chief Complaint  Patient presents with  . Medical Management of Chronic Issues  . Health Maintenance    Hep C screening, urine microalbumin, colonoscopy, foot and eye exam, PCV13    HPI:  Pt is a 64 y.o. male seen today for medical management of chronic diseases.    The patient resides in SNF [facility] for safety, care assistance, taking Donepezil for memory. Hx of PSP, progressing, frequent falling, on Sinemet, Requip, f/u neurology. HTN, loose controlled blood pressure, on Metoprolol 25mg  qd. Constipation is stable on Senokot S qo.    Past Medical History:  Diagnosis Date  . Adult onset fluency disorder   . Coronary artery disease    a. s/p stent left anterior descending artery remotely (per 2017 note, "about 4 years" prior).  b. Normal nuc 05/2015.  . Diabetes mellitus type 2, controlled (HCC) 01/18/2016  . Dysphagia 2018  . Gait instability 2018  . History of falling   . Hypercholesterolemia   . Hypertension   . Memory change 2018  . Parkinson's disease (HCC) 2018  . Weight loss 2017   intentional   Past Surgical History:  Procedure Laterality Date  . CAROTID STENT  2012   in left anterior descending artery past EF 55%  . nuclear stress test  06/17/2015   Gated SPECT images reveal normal LV systolic function. EF 55%. Comparing the rest & stress SPECT images, no ischemia. Gated images are normal. Dr. 06/19/2015, MD Vernell Barrier, [place] Du[place]ney     Allergies  Allergen Reactions  . Codeine     Unknown     Allergies as of 10/21/2019      Reactions   Codeine    Unknown       Medication List       Accurate as of October 21, 2019  4:54 PM. If you have any questions, ask your nurse or doctor.        STOP taking these medications   acetaminophen 325 MG tablet Commonly known as: TYLENOL Stopped by: Kasmira Cacioppo X Jamerica Snavely, NP     TAKE these medications   aspirin 81 MG chewable tablet Chew 81 mg by mouth daily. 7am-10am.   atorvastatin 10 MG tablet Commonly known as: LIPITOR Take 10 mg by mouth daily. 7pm-10pm   carbidopa-levodopa 25-100 MG tablet Commonly known as: SINEMET  IR Take 2 tablets by mouth 4 (four) times daily. 7am,12pm,4pm,9:30pm   donepezil 5 MG tablet Commonly known as: ARICEPT Take 5 mg by mouth at bedtime. For parkinson's disease   Metoprolol Succinate 25 MG Cs24 Take 25 mg by mouth daily. 7am-10am   rOPINIRole 0.25 MG tablet Commonly known as: REQUIP Take 0.25 mg by mouth at bedtime. 7pm-10pm.   sennosides-docusate sodium 8.6-50 MG tablet Commonly known as: SENOKOT-S Take 1 tablet by mouth every other day. 8pm.      ROS was provided with assistance of staff.  Review of Systems  Constitutional: Negative for activity change, appetite change, chills, diaphoresis, fatigue, fever and  unexpected weight change.  HENT: Negative for congestion and voice change.   Eyes: Negative for visual disturbance.  Respiratory: Negative for cough, shortness of breath and wheezing.   Cardiovascular: Negative for chest pain, palpitations and leg swelling.  Gastrointestinal: Negative for abdominal distention, abdominal pain, constipation, diarrhea, nausea and vomiting.  Genitourinary: Negative for difficulty urinating, dysuria and urgency.  Musculoskeletal: Positive for gait problem.  Skin: Positive for wound. Negative for color change and pallor.  Neurological: Negative for dizziness, tremors, syncope, facial asymmetry, speech difficulty and headaches.       Memory lapses. Mild muscle rigidity in all limbs.   Psychiatric/Behavioral: Negative for agitation, behavioral problems, hallucinations and sleep disturbance. The patient is not nervous/anxious.     Immunization History  Administered Date(s) Administered  . Influenza Whole 06/20/2018  . Influenza, High Dose Seasonal PF 06/20/2019  . Influenza-Unspecified 07/08/2017  . PFIZER SARS-COV-2 Vaccination 09/19/2019  . PPD Test 01/15/2017  . Pneumococcal Polysaccharide-23 08/05/2018  . Td 09/17/2016   Pertinent  Health Maintenance Due  Topic Date Due  . URINE MICROALBUMIN  06/03/1964  . COLONOSCOPY  06/03/2004  . OPHTHALMOLOGY EXAM  10/08/2017  . FOOT EXAM  01/21/2018  . PNA vac Low Risk Adult (1 of 2 - PCV13) 08/06/2019  . HEMOGLOBIN A1C  04/12/2020  . INFLUENZA VACCINE  Completed   Fall Risk  01/21/2017  Falls in the past year? Yes  Number falls in past yr: 2 or more  Injury with Fall? Yes  Risk Factor Category  High Fall Risk  Risk for fall due to : History of fall(s);Impaired balance/gait;Impaired mobility  Follow up Falls evaluation completed   Functional Status Survey:    Vitals:   10/21/19 0941  BP: (!) 150/90  Pulse: 63  Resp: 18  Temp: (!) 97.3 F (36.3 C)  SpO2: 97%  Weight: 226 lb 14.4 oz (102.9 kg)    Height: 5\' 9"  (1.753 m)   Body mass index is 33.51 kg/m. Physical Exam Vitals and nursing note reviewed.  Constitutional:      General: He is not in acute distress.    Appearance: Normal appearance. He is obese. He is not ill-appearing, toxic-appearing or diaphoretic.  HENT:     Head: Normocephalic.     Nose: Nose normal.     Mouth/Throat:     Mouth: Mucous membranes are moist.  Eyes:     Extraocular Movements: Extraocular movements intact.     Conjunctiva/sclera: Conjunctivae normal.     Pupils: Pupils are equal, round, and reactive to light.     Comments: Downward gaze  Cardiovascular:     Rate and Rhythm: Normal rate and regular rhythm.     Heart sounds: No murmur.  Pulmonary:     Breath sounds: No wheezing, rhonchi or rales.  Abdominal:     General: Bowel sounds are  normal. There is no distension.     Palpations: Abdomen is soft.     Tenderness: There is no abdominal tenderness. There is no right CVA tenderness, left CVA tenderness, guarding or rebound.  Musculoskeletal:     Cervical back: Normal range of motion and neck supple.     Right lower leg: No edema.     Left lower leg: No edema.  Skin:    General: Skin is warm and dry.     Findings: Bruising and erythema present.     Comments: Right parietal/occiput abrasion, no s/s bleeding or infection. Right forehead abrasion from previous injury is scabbed over.   Neurological:     General: No focal deficit present.     Mental Status: He is alert. Mental status is at baseline.     Motor: No weakness.     Coordination: Coordination abnormal.     Gait: Gait abnormal.     Comments: Oriented to person, place. Unsteady/gait disorder/frequent falling. Moves slow  Psychiatric:        Mood and Affect: Mood normal.        Behavior: Behavior normal.     Labs reviewed: Recent Labs    05/28/19 0000 06/30/19 0000 10/14/19 0000  NA 141 139 141  K 4.0 4.1 3.8  CL 106  --  106  CO2 29  --  24*  BUN 10 14 14   CREATININE  0.8 0.8 0.7  CALCIUM 8.8  --  8.6*   Recent Labs    03/27/19 0000 05/28/19 0000 06/30/19 0000  AST 10* 10* 8*  ALT 15 4* 4*  ALKPHOS  --  94 116  PROT  --  5.9  --   ALBUMIN  --  3.7  --    Recent Labs    05/28/19 0000 06/30/19 0000 10/14/19 0000  WBC 6.4 6.4 6.6  NEUTROABS  --  3,514  --   HGB 12.9* 12.9* 12.5*  HCT 41 40* 38*  PLT 207 209 219   Lab Results  Component Value Date   TSH 2.51 03/27/2019   Lab Results  Component Value Date   HGBA1C 5.8 10/14/2019   Lab Results  Component Value Date   CHOL 27 10/14/2019   HDL 30 (A) 06/09/2019   LDLCALC 28 06/09/2019   TRIG 134 10/14/2019   CHOLHDL 3.1 08/04/2018    Significant Diagnostic Results in last 30 days:  CT Head Wo Contrast  Result Date: 10/11/2019 CLINICAL DATA:  Status post trauma. EXAM: CT HEAD WITHOUT CONTRAST TECHNIQUE: Contiguous axial images were obtained from the base of the skull through the vertex without intravenous contrast. COMPARISON:  March 26, 2019 FINDINGS: Brain: There is mild cerebral atrophy with widening of the extra-axial spaces and ventricular dilatation. There are areas of decreased attenuation within the white matter tracts of the supratentorial brain, consistent with microvascular disease changes. Vascular: No hyperdense vessel or unexpected calcification. Skull: Normal. Negative for fracture or focal lesion. Sinuses/Orbits: No acute finding. Other: None. IMPRESSION: No acute intracranial pathology. Electronically Signed   By: Virgina Norfolk M.D.   On: 10/11/2019 16:06   CT Cervical Spine Wo Contrast  Result Date: 10/11/2019 CLINICAL DATA:  Status post trauma. EXAM: CT CERVICAL SPINE WITHOUT CONTRAST TECHNIQUE: Multidetector CT imaging of the cervical spine was performed without intravenous contrast. Multiplanar CT image reconstructions were also generated. COMPARISON:  April 05, 2019 FINDINGS: Alignment: Normal. Skull base and vertebrae: No acute fracture. No primary bone lesion or  focal pathologic process.  Soft tissues and spinal canal: No prevertebral fluid or swelling. No visible canal hematoma. Disc levels: C2-3: There is moderate severity end plate spondylosis. Mild disc space narrowing is seen. Bilateral facet hypertrophy is noted. Normal central canal and intervertebral neuroforamina. C3-4: There is mild end plate spondylosis. Mild disc space narrowing is seen. Bilateral facet hypertrophy is noted. Normal central canal and intervertebral neuroforamina. C4-5: There is moderate severity end plate spondylosis. Mild disc space narrowing is seen. Bilateral facet hypertrophy is noted. Normal central canal and intervertebral neuroforamina. C5-6: There is moderate severity end plate spondylosis. Moderate severity disc space narrowing is seen. Bilateral facet hypertrophy is noted. Normal central canal and intervertebral neuroforamina. C6-7: There is moderate severity end plate spondylosis. Moderate severity disc space narrowing is seen. Bilateral facet hypertrophy is noted. Normal central canal and intervertebral neuroforamina. C7-T1: There is mild end plate spondylosis. Moderate severity disc space narrowing is seen. Bilateral facet hypertrophy is noted. Normal central canal and intervertebral neuroforamina. Upper chest: Negative. Other: None. IMPRESSION: 1. Multilevel degenerative changes, without evidence of an acute osseous abnormality. Electronically Signed   By: Aram Candela M.D.   On: 10/11/2019 16:09    Assessment/Plan Recurrent falls 10/20/19 fall again when the patient was found sitting upright in the floor, resulted right parietal/occiput abrasion.   Scalp contusion Right parietal/occiput abrasion, no active bleeding or s/s of infection, it should heal.   Constipation Stable, continue Senokot S  Progressive supranuclear palsy (HCC) Progressing, close supervision/assistance for safety, continue Sinemet, Requip, f/u neurology.   Hypertension Loose control blood  pressure, continue Metoprolol  Cognitive impairment Lack of safety awareness, increased balance/gait issue, close supervision/assistance for safety. Continue Donepezil.   Forehead contusion The right forehead previous injury is scabbed over, healing nicely.   Unsteady gait The patient needs supervision/assistance for transfer, w/c for mobility, continue SNF [facility] for safety, care assistance.      Family/ staff Communication: plan of care reviewed with the patient and charge nurse.   Labs/tests ordered:  None  Time spend 25 minutes.

## 2019-10-21 NOTE — Assessment & Plan Note (Signed)
Loose control blood pressure, continue Metoprolol

## 2019-10-21 NOTE — Assessment & Plan Note (Signed)
Right parietal/occiput abrasion, no active bleeding or s/s of infection, it should heal.

## 2019-10-26 ENCOUNTER — Emergency Department (HOSPITAL_COMMUNITY): Payer: Medicare Other

## 2019-10-26 ENCOUNTER — Emergency Department (HOSPITAL_COMMUNITY)
Admission: EM | Admit: 2019-10-26 | Discharge: 2019-10-26 | Disposition: A | Discharge status: Home / Self Care | Payer: Medicare Other | Attending: Emergency Medicine | Admitting: Emergency Medicine

## 2019-10-26 DIAGNOSIS — M545 Low back pain, unspecified: Secondary | ICD-10-CM

## 2019-10-26 DIAGNOSIS — Y9389 Activity, other specified: Secondary | ICD-10-CM | POA: Diagnosis not present

## 2019-10-26 DIAGNOSIS — E119 Type 2 diabetes mellitus without complications: Secondary | ICD-10-CM | POA: Diagnosis not present

## 2019-10-26 DIAGNOSIS — Z7982 Long term (current) use of aspirin: Secondary | ICD-10-CM | POA: Insufficient documentation

## 2019-10-26 DIAGNOSIS — S0101XA Laceration without foreign body of scalp, initial encounter: Secondary | ICD-10-CM | POA: Insufficient documentation

## 2019-10-26 DIAGNOSIS — I10 Essential (primary) hypertension: Secondary | ICD-10-CM | POA: Diagnosis not present

## 2019-10-26 DIAGNOSIS — I2510 Atherosclerotic heart disease of native coronary artery without angina pectoris: Secondary | ICD-10-CM | POA: Insufficient documentation

## 2019-10-26 DIAGNOSIS — Y92009 Unspecified place in unspecified non-institutional (private) residence as the place of occurrence of the external cause: Secondary | ICD-10-CM | POA: Diagnosis not present

## 2019-10-26 DIAGNOSIS — Y999 Unspecified external cause status: Secondary | ICD-10-CM | POA: Insufficient documentation

## 2019-10-26 DIAGNOSIS — W050XXA Fall from non-moving wheelchair, initial encounter: Secondary | ICD-10-CM | POA: Diagnosis not present

## 2019-10-26 DIAGNOSIS — Z79899 Other long term (current) drug therapy: Secondary | ICD-10-CM | POA: Insufficient documentation

## 2019-10-26 DIAGNOSIS — W19XXXA Unspecified fall, initial encounter: Secondary | ICD-10-CM

## 2019-10-26 DIAGNOSIS — S0990XA Unspecified injury of head, initial encounter: Secondary | ICD-10-CM

## 2019-10-26 MED ORDER — LIDOCAINE-EPINEPHRINE (PF) 2 %-1:200000 IJ SOLN
10.0000 mL | Freq: Once | INTRAMUSCULAR | Status: AC
  Administered 2019-10-26: 10 mL
  Filled 2019-10-26: qty 20

## 2019-10-26 NOTE — ED Provider Notes (Signed)
[facility] EMERGENCY DEPARTMENT Provider Note   CSN: [id number] Arrival date & time: 10/26/19  1207     History Chief Complaint  Patient presents with  . Fall    Alan Wells is a 64 y.o. male.  HPI      Alan Wells is a 64 y.o. male, with a history of DM, Parkinson's, HTN, hypercholesterolemia, presenting to the ED from [facility] with fall that occurred shortly prior to arrival.  He states he was sitting in his wheelchair, reached out for the TV remote, loss of balance, and fell to the floor.  Since he was already twisted, he ended up hitting the back of his head on the floor. Complains of scalp pain and laceration.  Denies anticoagulation. Denies LOC, headache, nausea/vomiting, neck/back pain, acute neurologic deficits, dizziness, chest pain, shortness of breath, or any other complaints.      Past Medical History:  Diagnosis Date  . Adult onset fluency disorder   . Coronary artery disease    a. s/p stent left anterior descending artery remotely (per 2017 note, "about 4 years" prior). b. Normal nuc 05/2015.  . Diabetes mellitus type 2, controlled (HCC) 01/18/2016  . Dysphagia 2018  . Gait instability 2018  . History of falling   . Hypercholesterolemia   . Hypertension   . Memory change 2018  . Parkinson's disease (HCC) 2018  . Weight loss 2017   intentional    Patient Active Problem List   Diagnosis Date Noted  . Forehead contusion 10/12/2019  . Bradycardia 08/12/2019  . Pacemaker 07/13/2019  . Scalp contusion 06/10/2019  . Weight loss 03/25/2019  . Recurrent falls 01/20/2019  . Dizziness 10/06/2018  . Constipation 06/03/2017  . Obesity (BMI 30-39.9) 05/10/2017  . Insomnia 03/19/2017  . NSTEMI (non-ST elevated myocardial infarction) (HCC)   . Hypertension   . Hypercholesterolemia   . 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 Cognitive impairment 09/17/2016  . Unsteady gait 09/17/2016  . Dysphagia 09/17/2016  . Diabetes  mellitus type 2, controlled (HCC) 01/18/2016  . Coronary artery disease 09/17/2014    Past Surgical History:  Procedure Laterality Date  . CAROTID STENT  2012   in left anterior descending artery past EF 55%  . nuclear stress test  06/17/2015   Gated SPECT images reveal normal LV systolic function. EF 55%. Comparing the rest & stress SPECT images, no ischemia. Gated images are normal. Dr. Vernell Barrier, MD Lacy Du[place], [place]        Family History  Problem Relation Age of Onset  . Cancer Mother   . Heart disease Mother     Social History   Tobacco Use  . Smoking status: Never Smoker  . Smokeless tobacco: Never Used  Substance Use Topics  . Alcohol use: No  . Drug use: No    Home Medications Prior to Admission medications   Medication Sig Start Date End Date Taking? Authorizing Provider  acetaminophen (TYLENOL) 325 MG tablet Take 650 mg by mouth every 4 (four) hours as needed for mild pain or headache.   Yes [provider]  aspirin 81 MG chewable tablet Chew 81 mg by mouth daily. 7am-10am.   Yes [provider]  atorvastatin (LIPITOR) 10 MG tablet Take 10 mg by mouth daily. 7pm-10pm   Yes [provider]  carbidopa-levodopa (SINEMET IR) 25-100 MG tablet Take 2 tablets by mouth 4 (four) times daily. 7am,12pm,4pm,9:30pm 04/10/19  Yes [provider]  donepezil (ARICEPT) 5 MG tablet Take 5 mg  by mouth at bedtime. For parkinson's disease   Yes [provider]  Metoprolol Succinate 25 MG CS24 Take 25 mg by mouth daily. 7am-10am   Yes [provider]  rOPINIRole (REQUIP) 0.25 MG tablet Take 0.25 mg by mouth at bedtime. 7pm-10pm.   Yes [provider]  sennosides-docusate sodium (SENOKOT-S) 8.6-50 MG tablet Take 1 tablet by mouth every other day. 8pm.   Yes [provider]    Allergies    Codeine  Review of Systems   Review of Systems  Constitutional: Negative for fever.  Respiratory: Negative for shortness of  breath.   Cardiovascular: Negative for chest pain.  Gastrointestinal: Negative for abdominal pain, nausea and vomiting.  Musculoskeletal: Negative for back pain and neck pain.  Skin: Positive for wound.  Neurological: Negative for dizziness, weakness and numbness.  All other systems reviewed and are negative.   Physical Exam Updated Vital Signs BP 130/73 (BP Location: Right Arm)   Pulse (!) 57   Temp 98.7 F (37.1 C) (Oral)   Resp 14   SpO2 94%   Physical Exam Vitals and nursing note reviewed.  Constitutional:      General: He is not in acute distress.    Appearance: He is well-developed. He is not diaphoretic.  HENT:     Head: Normocephalic.     Comments: Approximately 6 cm laceration to the left occipital scalp without swelling, deformity, or instability noted. No other abnormalities noted to the scalp or face.    Nose: Nose normal.     Mouth/Throat:     Mouth: Mucous membranes are moist.     Pharynx: Oropharynx is clear.  Eyes:     Conjunctiva/sclera: Conjunctivae normal.  Cardiovascular:     Rate and Rhythm: Normal rate and regular rhythm.     Pulses: Normal pulses.          Radial pulses are 2+ on the right side and 2+ on the left side.       Posterior tibial pulses are 2+ on the right side and 2+ on the left side.     Heart sounds: Normal heart sounds.     Comments: Tactile temperature in the extremities appropriate and equal bilaterally. Pulmonary:     Effort: Pulmonary effort is normal. No respiratory distress.     Breath sounds: Normal breath sounds.  Abdominal:     Palpations: Abdomen is soft.     Tenderness: There is no abdominal tenderness. There is no guarding.  Musculoskeletal:     Cervical back: Neck supple.     Right lower leg: No edema.     Left lower leg: No edema.     Comments: Normal motor function intact in all extremities. No midline spinal tenderness.   Lymphadenopathy:     Cervical: No cervical adenopathy.  Skin:    General: Skin is warm  and dry.  Neurological:     Mental Status: He is alert and oriented to person, place, and time.     Comments: No noted acute cognitive deficit. Sensation grossly intact to light touch in the extremities.   Grip strengths equal bilaterally.   Strength 5/5 in all extremities.  Coordination intact.  Cranial nerves III-XII grossly intact.  Handles oral secretions without noted difficulty.  No noted phonation or speech deficit. No facial droop.   Psychiatric:        Mood and Affect: Mood and affect normal.        Speech: Speech normal.  Behavior: Behavior normal.     ED Results / Procedures / Treatments   Labs (all labs ordered are listed, but only abnormal results are displayed) Labs Reviewed - No data to display  EKG None  Radiology CT Head Wo Contrast  Result Date: 10/26/2019 CLINICAL DATA:  Fall from standing position. Posterior head pain with laceration. No blood thinners. EXAM: CT HEAD WITHOUT CONTRAST TECHNIQUE: Contiguous axial images were obtained from the base of the skull through the vertex without intravenous contrast. COMPARISON:  CT head 10/11/2019 FINDINGS: Brain: There is no evidence of acute intracranial hemorrhage, mass lesion, brain edema or extra-axial fluid collection. The ventricles and subarachnoid spaces are appropriately sized for age. There is no CT evidence of acute cortical infarction. Vascular: Intracranial vascular calcifications. No hyperdense vessel identified. Skull: Negative for fracture or focal lesion. Sinuses/Orbits: The visualized paranasal sinuses and mastoid air cells are clear. No orbital abnormalities are seen. Other: None. IMPRESSION: Stable unremarkable noncontrast head CT. Electronically Signed   By: Richardean Sale M.D.   On: 10/26/2019 13:41    Procedures .Marland KitchenLaceration Repair  Date/Time: 10/26/2019 2:40 PM Performed by: Lorayne Bender, PA-C Authorized by: Lorayne Bender, PA-C   Consent:    Consent obtained:  Verbal   Consent given by:   Patient   Risks discussed:  Infection, pain, need for additional repair, poor cosmetic result and poor wound healing Anesthesia (see MAR for exact dosages):    Anesthesia method:  Local infiltration   Local anesthetic:  Lidocaine 2% WITH epi Laceration details:    Location:  Scalp   Scalp location:  Occipital   Length (cm):  6 Repair type:    Repair type:  Simple Pre-procedure details:    Preparation:  Patient was prepped and draped in usual sterile fashion and imaging obtained to evaluate for foreign bodies Exploration:    Wound exploration: entire depth of wound probed and visualized   Treatment:    Area cleansed with:  Betadine and saline   Amount of cleaning:  Standard   Irrigation solution:  Sterile saline   Irrigation method:  Syringe Skin repair:    Repair method:  Staples   Number of staples:  6 Approximation:    Approximation:  Close Post-procedure details:    Dressing:  Open (no dressing)   Patient tolerance of procedure:  Tolerated well, no immediate complications   (including critical care time)  Medications Ordered in ED Medications  lidocaine-EPINEPHrine (XYLOCAINE W/EPI) 2 %-1:200000 (PF) injection 10 mL (has no administration in time range)    ED Course  I have reviewed the triage vital signs and the nursing notes.  Pertinent labs & imaging results that were available during my care of the patient were reviewed by me and considered in my medical decision making (see chart for details).  Clinical Course as of Oct 25 1454  Mon Oct 26, 2019  1428 During laceration repair, patient complains of back pain.  He was not previously complaining of back pain during initial exam.  He now has some lower thoracic and upper lumbar tenderness.   [SJ]    Clinical Course User Index [SJ] Lindsie Simar, Helane Gunther, PA-C   MDM Rules/Calculators/A&P                       Patient presents for evaluation following mechanical fall.  Head CT without acute abnormality.  Laceration  repaired without immediate complication.  Patient began complaining of back pain during his ED course after  the initial exam.  X-ray orders were placed.  Findings and plan of care discussed with Carmell Austria, MD.    End of shift patient care handoff report given to [place] Hunter, EM resident.  Plan: Review outstanding xrays. Discharge patient.  Final Clinical Impression(s) / ED Diagnoses Final diagnoses:  Fall, initial encounter  Injury of head, initial encounter  Laceration of scalp, initial encounter    Rx / DC Orders ED Discharge Orders    None       Concepcion Living 10/26/19 1456    Benjiman Core, MD 10/26/19 [phone]

## 2019-10-26 NOTE — Discharge Instructions (Addendum)
  Wound Care - Laceration You may remove the bandage after 24 hours. Clean the wound and surrounding area gently with tap water and mild soap. Rinse well and blot dry. Do not scrub the wound, as this may cause the wound edges to come apart. You may shower, but avoid submerging the wound, such as with a bath or swimming. Clean the wound daily to prevent infection. Do not use cleaners such as hydrogen peroxide or alcohol.   Scar reduction: Application of a topical antibiotic ointment, such as Neosporin, after the wound has begun to close and heal well can decrease scab formation and reduce scarring. After the wound has healed and wound closures have been removed, application of ointments such as Aquaphor can also reduce scar formation.  The key to scar reduction is keeping the skin well hydrated and supple. Drinking plenty of water throughout the day (At least eight 8oz glasses of water a day) is essential to staying well hydrated.  Sun exposure: Keep the wound out of the sun. After the wound has healed, continue to protect it from the sun by wearing protective clothing or applying sunscreen.  Pain: You may use Tylenol for pain.  Suture/staple removal: Go to a PCP or return to the ED in 5 days for staple removal.  Return to the ED sooner should the wound edges come apart or signs of infection arise, such as spreading redness, puffiness/swelling, pus draining from the wound, severe increase in pain, fever over 100.38F, or any other major issues.  For prescription assistance, may try using prescription discount sites or apps, such as goodrx.com

## 2019-10-26 NOTE — ED Notes (Signed)
Patient transported to CT 

## 2019-10-26 NOTE — ED Provider Notes (Signed)
Medical Decision Making: Care of patient assumed from [facility] at 1500.  Agree with history, physical exam and plan.  See their note for further details.  Briefly, 64 y.o. male with PMH/PSH as below.  Past Medical History:  Diagnosis Date  . Adult onset fluency disorder   . Coronary artery disease    a. s/p stent left anterior descending artery remotely (per 2017 note, "about 4 years" prior). b. Normal nuc 05/2015.  . Diabetes mellitus type 2, controlled (HCC) 01/18/2016  . Dysphagia 2018  . Gait instability 2018  . History of falling   . Hypercholesterolemia   . Hypertension   . Memory change 2018  . Parkinson's disease (HCC) 2018  . Weight loss 2017   intentional   Past Surgical History:  Procedure Laterality Date  . CAROTID STENT  2012   in left anterior descending artery past EF 55%  . nuclear stress test  06/17/2015   Gated SPECT images reveal normal LV systolic function. EF 55%. Comparing the rest & stress SPECT images, no ischemia. Gated images are normal. Dr. Vernell Barrier, MD [place] [place], [place]      Patient HDS at handoff.    Plan at time of handoff:  Fall, head injury, CT head negative, got stapled, has parkinsons.  Now with back pain.  Follow up XR.    ED Course  XR T and L spine with possible L2 L3 TP fx.  Rechecked, no TTP at these sites.  Discussed with [facility] on call, symptomatic management recommended.    Spoke with pt's nurse at facility, updated on findings, instructed on suture removal and follow up.    Discharged home in stable condition.   Clinical Impression 1. Fall, initial encounter   2. Injury of head, initial encounter   3. Laceration of scalp, initial encounter   4. Acute low back pain without sciatica, unspecified back pain laterality    Disposition:  Discharge  Patient care provided under supervision of my attending, Dr. Denton Lank.       Jerrald Doverspike, [place], MD 10/27/19 Gerline Legacy    Cathren Laine, MD 10/27/19 1540

## 2019-10-26 NOTE — ED Triage Notes (Signed)
Came in via ems; c/o fall from standing; stated falling backwards d/t loss of balance; denies LOC and blood thinner. A&Ox3. Pt c/o pain in back of head. EMS endorsed laceration on back of head approx 4 inches.

## 2019-10-26 NOTE — ED Notes (Signed)
Pt. Is laying on right side, for better comfort level.

## 2019-10-26 NOTE — ED Notes (Signed)
[facility] called @ 1757-per Magnus Ivan, RN called by Marylene Land

## 2019-10-27 ENCOUNTER — Non-Acute Institutional Stay (SKILLED_NURSING_FACILITY): Payer: Medicare Other | Admitting: Internal Medicine

## 2019-10-27 ENCOUNTER — Encounter: Payer: Self-pay | Admitting: Internal Medicine

## 2019-10-27 DIAGNOSIS — I10 Essential (primary) hypertension: Secondary | ICD-10-CM | POA: Diagnosis not present

## 2019-10-27 DIAGNOSIS — S0101XS Laceration without foreign body of scalp, sequela: Secondary | ICD-10-CM | POA: Diagnosis not present

## 2019-10-27 DIAGNOSIS — R296 Repeated falls: Secondary | ICD-10-CM | POA: Diagnosis not present

## 2019-10-27 NOTE — Progress Notes (Signed)
Location:   [facility] Room Number: 4 Place of Service:  SNF [phone]) Provider:  Mahlon Gammon, MD  Mahlon Gammon, MD  Patient Care Team: Mahlon Gammon, MD as PCP - General (Internal Medicine) [name], [name], NP as Nurse Practitioner (Internal Medicine)  Extended Emergency Contact Information Primary Emergency Contact: Fano,Frank Address: [address]          [place], [place] [address] [place] Home Phone: [phone] Mobile Phone: [phone] Relation: Brother  Code Status:  DNR Goals of care: Advanced Directive information Advanced Directives 10/21/2019  Does Patient Have a Medical Advance Directive? Yes  Type of Advance Directive Out of facility DNR (pink MOST or yellow form);Healthcare Power of Attorney  Does patient want to make changes to medical advance directive? No - Patient declined  Copy of Healthcare Power of Attorney in Chart? Yes - validated most recent copy scanned in chart (See row information)  Would patient like information on creating a medical advance directive? -  Pre-existing out of facility DNR order (yellow form or pink MOST form) Yellow form placed in chart (order not valid for inpatient use)     Chief Complaint  Patient presents with   Acute Visit    ED Follow up    HPI:  Pt is a 64 y.o. male seen today for an acute visit for ED follow up after Fall with Laceration   Patient has a history of recurrent falls,Parkinson disease diagnosed in 2018,diabetes mellitus, coronary artery disease,hypertension, hyperlipidemia, cognitive impairment  He has h/o Falls. Due to his PSP , Parkinson disease and poor awareness. Nursing facility has tried everything to prevent falls. But patient keeps getting up and just falls. Denies any Dizzy, or Syncope. He sustained laceration needing visit to ED. His CT scan of head was negative for Acute change. But he ? Had L2-3 Transverse Fractures Patient is c/o Lower back also c/o  Neck Pain but his xray was negative No Other Acute complain today. Denies any headache.    Past Medical History:  Diagnosis Date   Adult onset fluency disorder    Coronary artery disease    a. s/p stent left anterior descending artery remotely (per 2017 note, "about 4 years" prior). b. Normal nuc 05/2015.   Diabetes mellitus type 2, controlled (HCC) 01/18/2016   Dysphagia 2018   Gait instability 2018   History of falling    Hypercholesterolemia    Hypertension    Memory change 2018   Parkinson's disease (HCC) 2018   Weight loss 2017   intentional   Past Surgical History:  Procedure Laterality Date   CAROTID STENT  2012   in left anterior descending artery past EF 55%   nuclear stress test  06/17/2015   Gated SPECT images reveal normal LV systolic function. EF 55%. Comparing the rest & stress SPECT images, no ischemia. Gated images are normal. Dr. Vernell Barrier, MD [facility] [place], [place]     Allergies  Allergen Reactions   Codeine     Unknown     Allergies as of 10/27/2019      Reactions   Codeine    Unknown       Medication List       Accurate as of October 27, 2019 11:34 AM. If you have any questions, ask your nurse or doctor.        STOP taking these medications   acetaminophen 325 MG tablet Commonly known as: TYLENOL Stopped by: Pooja Camuso L  Chales Abrahams, MD     TAKE these medications   aspirin 81 MG chewable tablet Chew 81 mg by mouth daily. 7am-10am.   atorvastatin 10 MG tablet Commonly known as: LIPITOR Take 10 mg by mouth daily. 7pm-10pm   carbidopa-levodopa 25-100 MG tablet Commonly known as: SINEMET IR Take 2 tablets by mouth 4 (four) times daily. 7am,12pm,4pm,9:30pm   donepezil 5 MG tablet Commonly known as: ARICEPT Take 5 mg by mouth at bedtime. For parkinson's disease   Metoprolol Succinate 25 MG Cs24 Take 25 mg by mouth daily. 7am-10am   rOPINIRole 0.25 MG tablet Commonly known as: REQUIP Take 0.25 mg by mouth at bedtime. 7pm-10pm.     sennosides-docusate sodium 8.6-50 MG tablet Commonly known as: SENOKOT-S Take 1 tablet by mouth every other day. 8pm.       Review of Systems  Constitutional: Negative.   HENT: Positive for congestion.   Respiratory: Positive for cough.   Cardiovascular: Negative.   Gastrointestinal: Negative.   Genitourinary: Negative.   Musculoskeletal: Positive for back pain and gait problem.  Skin: Negative.   Neurological: Positive for weakness.  Psychiatric/Behavioral: Positive for confusion.    Immunization History  Administered Date(s) Administered   Influenza Whole 06/20/2018   Influenza, High Dose Seasonal PF 06/20/2019   Influenza-Unspecified 07/08/2017   PFIZER SARS-COV-2 Vaccination 09/19/2019   PPD Test 01/15/2017   Pneumococcal Polysaccharide-23 08/05/2018   Td 09/17/2016   Pertinent  Health Maintenance Due  Topic Date Due   URINE MICROALBUMIN  06/03/1964   COLONOSCOPY  06/03/2004   OPHTHALMOLOGY EXAM  10/08/2017   FOOT EXAM  01/21/2018   PNA vac Low Risk Adult (1 of 2 - PCV13) 08/06/2019   HEMOGLOBIN A1C  04/12/2020   INFLUENZA VACCINE  Completed   Fall Risk  01/21/2017  Falls in the past year? Yes  Number falls in past yr: 2 or more  Injury with Fall? Yes  Risk Factor Category  High Fall Risk  Risk for fall due to : History of fall(s);Impaired balance/gait;Impaired mobility  Follow up Falls evaluation completed   Functional Status Survey:    Vitals:   10/27/19 1131  BP: 138/64  Pulse: 60  Resp: (!) 22  Temp: 97.7 F (36.5 C)  SpO2: 95%  Weight: 226 lb 14.4 oz (102.9 kg)  Height: 5\' 9"  (1.753 m)   Body mass index is 33.51 kg/m. Physical Exam Vitals reviewed.  Constitutional:      Appearance: Normal appearance.  HENT:     Head: Normocephalic.     Nose: Nose normal.     Mouth/Throat:     Mouth: Mucous membranes are moist.     Pharynx: Oropharynx is clear.  Eyes:     Pupils: Pupils are equal, round, and reactive to light.   Cardiovascular:     Rate and Rhythm: Normal rate and regular rhythm.     Pulses: Normal pulses.  Pulmonary:     Effort: Pulmonary effort is normal.     Breath sounds: Normal breath sounds.  Abdominal:     General: Abdomen is flat. Bowel sounds are normal.     Palpations: Abdomen is soft.  Musculoskeletal:        General: No swelling.     Cervical back: Neck supple.  Skin:    General: Skin is warm.  Neurological:     General: No focal deficit present.     Mental Status: He is alert.  Psychiatric:        Mood and Affect: Mood normal.  Labs reviewed: Recent Labs    05/28/19 0000 06/30/19 0000 10/14/19 0000  NA 141 139 141  K 4.0 4.1 3.8  CL 106  --  106  CO2 29  --  24*  BUN 10 14 14   CREATININE 0.8 0.8 0.7  CALCIUM 8.8  --  8.6*   Recent Labs    03/27/19 0000 05/28/19 0000 06/30/19 0000  AST 10* 10* 8*  ALT 15 4* 4*  ALKPHOS  --  94 116  PROT  --  5.9  --   ALBUMIN  --  3.7  --    Recent Labs    05/28/19 0000 06/30/19 0000 10/14/19 0000  WBC 6.4 6.4 6.6  NEUTROABS  --  3,514  --   HGB 12.9* 12.9* 12.5*  HCT 41 40* 38*  PLT 207 209 219   Lab Results  Component Value Date   TSH 2.51 03/27/2019   Lab Results  Component Value Date   HGBA1C 5.8 10/14/2019   Lab Results  Component Value Date   CHOL 27 10/14/2019   HDL 30 (A) 06/09/2019   LDLCALC 28 06/09/2019   TRIG 134 10/14/2019   CHOLHDL 3.1 08/04/2018    Significant Diagnostic Results in last 30 days:  DG Thoracic Spine 2 View  Result Date: 10/26/2019 CLINICAL DATA:  Recent fall with back pain, initial encounter EXAM: THORACIC SPINE 2 VIEWS COMPARISON:  None. FINDINGS: Vertebral body height is well maintained. Multilevel osteophytic changes are seen. No paraspinal mass lesion is noted. No pedicle abnormality is seen. Visualize ribcage is within normal limits. IMPRESSION: Multilevel degenerative change without acute abnormality. Electronically Signed   By: Inez Catalina M.D.   On: 10/26/2019  15:44   DG Lumbar Spine Complete  Result Date: 10/26/2019 CLINICAL DATA:  Recent fall with low back pain, initial encounter EXAM: LUMBAR SPINE - COMPLETE 4+ VIEW COMPARISON:  None. FINDINGS: Five lumbar type vertebral bodies are well visualized. Vertebral body height is well maintained. There is suspicion of fractures involving the second and third transverse processes on right best noted on the frontal film. Mild facet hypertrophic changes are seen. Osteophytic changes are noted in the upper lumbar spine. No acute soft tissue abnormality is seen. Nonobstructing left renal stone is noted. IMPRESSION: Changes suspicious for L2 and L3 transverse process fractures. Tiny lower pole left renal stone. Electronically Signed   By: Inez Catalina M.D.   On: 10/26/2019 15:40   CT Head Wo Contrast  Result Date: 10/26/2019 CLINICAL DATA:  Fall from standing position. Posterior head pain with laceration. No blood thinners. EXAM: CT HEAD WITHOUT CONTRAST TECHNIQUE: Contiguous axial images were obtained from the base of the skull through the vertex without intravenous contrast. COMPARISON:  CT head 10/11/2019 FINDINGS: Brain: There is no evidence of acute intracranial hemorrhage, mass lesion, brain edema or extra-axial fluid collection. The ventricles and subarachnoid spaces are appropriately sized for age. There is no CT evidence of acute cortical infarction. Vascular: Intracranial vascular calcifications. No hyperdense vessel identified. Skull: Negative for fracture or focal lesion. Sinuses/Orbits: The visualized paranasal sinuses and mastoid air cells are clear. No orbital abnormalities are seen. Other: None. IMPRESSION: Stable unremarkable noncontrast head CT. Electronically Signed   By: Richardean Sale M.D.   On: 10/26/2019 13:41   CT Head Wo Contrast  Result Date: 10/11/2019 CLINICAL DATA:  Status post trauma. EXAM: CT HEAD WITHOUT CONTRAST TECHNIQUE: Contiguous axial images were obtained from the base of the skull  through the vertex without intravenous contrast.  COMPARISON:  March 26, 2019 FINDINGS: Brain: There is mild cerebral atrophy with widening of the extra-axial spaces and ventricular dilatation. There are areas of decreased attenuation within the white matter tracts of the supratentorial brain, consistent with microvascular disease changes. Vascular: No hyperdense vessel or unexpected calcification. Skull: Normal. Negative for fracture or focal lesion. Sinuses/Orbits: No acute finding. Other: None. IMPRESSION: No acute intracranial pathology. Electronically Signed   By: Aram Candela M.D.   On: 10/11/2019 16:06   CT Cervical Spine Wo Contrast  Result Date: 10/11/2019 CLINICAL DATA:  Status post trauma. EXAM: CT CERVICAL SPINE WITHOUT CONTRAST TECHNIQUE: Multidetector CT imaging of the cervical spine was performed without intravenous contrast. Multiplanar CT image reconstructions were also generated. COMPARISON:  April 05, 2019 FINDINGS: Alignment: Normal. Skull base and vertebrae: No acute fracture. No primary bone lesion or focal pathologic process. Soft tissues and spinal canal: No prevertebral fluid or swelling. No visible canal hematoma. Disc levels: C2-3: There is moderate severity end plate spondylosis. Mild disc space narrowing is seen. Bilateral facet hypertrophy is noted. Normal central canal and intervertebral neuroforamina. C3-4: There is mild end plate spondylosis. Mild disc space narrowing is seen. Bilateral facet hypertrophy is noted. Normal central canal and intervertebral neuroforamina. C4-5: There is moderate severity end plate spondylosis. Mild disc space narrowing is seen. Bilateral facet hypertrophy is noted. Normal central canal and intervertebral neuroforamina. C5-6: There is moderate severity end plate spondylosis. Moderate severity disc space narrowing is seen. Bilateral facet hypertrophy is noted. Normal central canal and intervertebral neuroforamina. C6-7: There is moderate severity end  plate spondylosis. Moderate severity disc space narrowing is seen. Bilateral facet hypertrophy is noted. Normal central canal and intervertebral neuroforamina. C7-T1: There is mild end plate spondylosis. Moderate severity disc space narrowing is seen. Bilateral facet hypertrophy is noted. Normal central canal and intervertebral neuroforamina. Upper chest: Negative. Other: None. IMPRESSION: 1. Multilevel degenerative changes, without evidence of an acute osseous abnormality. Electronically Signed   By: Aram Candela M.D.   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 Lumbar Fracture Pain seems controlled  Other issues Progressive supranuclear palsy (HCC) Continues to follow with Neurology On Sinemet  Essential hypertension Controlled on  Metoprolol  Was taken off Lisinopril Loose Control due to his Falls  Cognitive impairment On Aricept and Supportive Care   Controlled type 2 diabetes mellitus without complication, without long-term current use of insulin (HCC) Last A1 C was 5.8 in 1/21  CAD On Aspirin, Statin and Beta blocker Hyperlipidemia Dose was reduced for Low LDL Repeat LDL is 43 Routine Screening Has never had Screening Colonoscopy per Patient  Will d/w his Brother  Labs/tests ordered:

## 2019-11-09 ENCOUNTER — Non-Acute Institutional Stay (SKILLED_NURSING_FACILITY): Payer: Medicare Other | Admitting: Nurse Practitioner

## 2019-11-09 ENCOUNTER — Encounter: Payer: Self-pay | Admitting: Nurse Practitioner

## 2019-11-09 DIAGNOSIS — G231 Progressive supranuclear ophthalmoplegia [Steele-Richardson-Olszewski]: Secondary | ICD-10-CM

## 2019-11-09 DIAGNOSIS — F419 Anxiety disorder, unspecified: Secondary | ICD-10-CM | POA: Diagnosis not present

## 2019-11-09 DIAGNOSIS — R296 Repeated falls: Secondary | ICD-10-CM

## 2019-11-09 DIAGNOSIS — K5900 Constipation, unspecified: Secondary | ICD-10-CM

## 2019-11-09 DIAGNOSIS — R4189 Other symptoms and signs involving cognitive functions and awareness: Secondary | ICD-10-CM

## 2019-11-09 DIAGNOSIS — I10 Essential (primary) hypertension: Secondary | ICD-10-CM | POA: Diagnosis not present

## 2019-11-09 NOTE — Assessment & Plan Note (Signed)
Continue [facility] for safety, care assistance, close supervision for safety, continue Donepezil.

## 2019-11-09 NOTE — Assessment & Plan Note (Signed)
reported anxiety, agitation 11/08/19 twirling his thumbs, trying to get up w/o assistance, restless, yelling out staff names even when staff have just given assistance, the patient said I don't know when he was asked what was wrong. Larey Seat 11/09/19 when the patient was found on the floor lying on his left side. Poor safety awareness, unsteady gait are contributory, close supervision/assistance for safety.

## 2019-11-09 NOTE — Assessment & Plan Note (Signed)
Under neurology care, continue Requip, Sinemet.

## 2019-11-09 NOTE — Progress Notes (Signed)
Location:  [facility] Room Number: 4 Place of Service:  SNF (31) Provider:  Olof Marcil Otho Darner, NP   [name], MD  Patient Care Team: [name], MD as PCP - General (Internal Medicine) Adeena Bernabe X, NP as Nurse Practitioner (Internal Medicine)  Extended Emergency Contact Information Primary Emergency Contact: Carmack,Frank Address: [address], [place] [address] [place]nette Litter of [place] Phone: [phone] Mobile Phone: [phone] Relation: Brother  Code Status:  DNR Goals of care: Advanced Directive information Advanced Directives 11/09/2019  Does Patient Have a Medical Advance Directive? Yes  Type of Advance Directive Out of facility DNR (pink MOST or yellow form)  Does patient want to make changes to medical advance directive? No - Patient declined  Copy of [place] in Chart? -  Would patient like information on creating a medical advance directive? -  Pre-existing out of facility DNR order (yellow form or pink MOST form) Yellow form placed in chart (order not valid for inpatient use)     Chief Complaint  Patient presents with   Acute Visit    Fall, Anxiety, and Agitation     HPI:  Pt is a 64 y.o. male seen today for an acute visit for reported anxiety, agitation 11/08/19 twirling his thumbs, trying to get up w/o assistance, restless, yelling out staff names even when staff have just given assistance, the patient said I don't know when he was asked what was wrong. [place] 11/09/19 when the patient was found on the floor lying on his left side. Hx of PSP, on Requip, Sinemet. Dementia, resides in SNF [facility] for safety, care assistance, on Donepezil '5mg'$  qd. HTN, blood pressure is loose controlled, on Metoprolol '25mg'$  qd. Constipation, stable, on Senokot S qod.     Past Medical History:  Diagnosis Date   Adult onset fluency disorder    Coronary artery disease    a. s/p stent left anterior descending artery  remotely (per 2017 note, "about 4 years" prior). b. Normal nuc 05/2015.   Diabetes mellitus type 2, controlled ([place]) 01/18/2016   Dysphagia 2018   Gait instability 2018   History of falling    Hypercholesterolemia    Hypertension    Memory change 2018   Parkinson's disease ([place]) 2018   Weight loss 2017   intentional   Past Surgical History:  Procedure Laterality Date   CAROTID STENT  2012   in left anterior descending artery past EF 55%   nuclear stress test  06/17/2015   Gated SPECT images reveal normal LV systolic function. EF 55%. Comparing the rest & stress SPECT images, no ischemia. Gated images are normal. Dr. [name], MD [name], [name]     Allergies  Allergen Reactions   Codeine     Unknown     Outpatient Encounter Medications as of 11/09/2019  Medication Sig   acetaminophen (TYLENOL) 325 MG tablet Take 650 mg by mouth every 4 (four) hours as needed. pain/headache x 48 hours. Notify MD of continued pain/headache. Do not exceed 3,000 mg in 24 hours. Document area of discomfort and effectiveness of medication.   aspirin 81 MG chewable tablet Chew 81 mg by mouth daily. 7am-10am.   atorvastatin (LIPITOR) 10 MG tablet Take 10 mg by mouth daily. 7pm-10pm   carbidopa-levodopa (SINEMET IR) 25-100 MG tablet Take 2 tablets by mouth 4 (four) times daily. 7am,12pm,4pm,9:30pm   donepezil (ARICEPT) 5 MG tablet Take 5  mg by mouth at bedtime. For parkinson's disease   Metoprolol Succinate 25 MG CS24 Take 25 mg by mouth daily. 7am-10am   NON FORMULARY Moisturizing Cream to extremities with morning and evening care Twice A Day   rOPINIRole (REQUIP) 0.25 MG tablet Take 0.25 mg by mouth at bedtime. 7pm-10pm.   sennosides-docusate sodium (SENOKOT-S) 8.6-50 MG tablet Take 1 tablet by mouth every other day. 8pm.   No facility-administered encounter medications on file as of 11/09/2019.  ROS was provided with assistance of staff.   Review of Systems  Constitutional:  Negative for activity change, appetite change, chills, diaphoresis, fatigue, fever and unexpected weight change.  HENT: Negative for congestion and voice change.   Eyes: Negative for visual disturbance.  Respiratory: Positive for cough. Negative for shortness of breath and wheezing.   Cardiovascular: Negative for chest pain, palpitations and leg swelling.  Gastrointestinal: Negative for abdominal distention, abdominal pain, constipation, diarrhea, nausea and vomiting.  Genitourinary: Negative for difficulty urinating, dysuria and urgency.  Musculoskeletal: Positive for gait problem.  Skin: Positive for wound. Negative for color change and pallor.  Neurological: Negative for dizziness, tremors, syncope, facial asymmetry, speech difficulty and headaches.       Memory lapses. Mild muscle rigidity in all limbs.   Psychiatric/Behavioral: Positive for agitation and behavioral problems. Negative for hallucinations and sleep disturbance. The patient is nervous/anxious.     Immunization History  Administered Date(s) Administered   Influenza Whole 06/20/2018   Influenza, High Dose Seasonal PF 06/20/2019   Influenza-Unspecified 07/08/2017   PFIZER SARS-COV-2 Vaccination 09/19/2019   PPD Test 01/15/2017   Pneumococcal Polysaccharide-23 08/05/2018   Td 09/17/2016   Pertinent  Health Maintenance Due  Topic Date Due   URINE MICROALBUMIN  06/03/1964   COLONOSCOPY  06/03/2004   OPHTHALMOLOGY EXAM  10/08/2017   FOOT EXAM  01/21/2018   PNA vac Low Risk Adult (1 of 2 - PCV13) 08/06/2019   HEMOGLOBIN A1C  04/12/2020   INFLUENZA VACCINE  Completed   Fall Risk  01/21/2017  Falls in the past year? Yes  Number falls in past yr: 2 or more  Injury with Fall? Yes  Risk Factor Category  High Fall Risk  Risk for fall due to : History of fall(s);Impaired balance/gait;Impaired mobility  Follow up Falls evaluation completed   Functional Status Survey:    Vitals:   11/09/19 1458  BP: (!)  150/80  Pulse: (!) 57  Resp: 20  Temp: 98.3 F (36.8 C)  TempSrc: Oral  SpO2: 95%  Weight: 226 lb 14.4 oz (102.9 kg)  Height: '5\' 11"'$  (1.803 m)   Body mass index is 31.65 kg/m. Physical Exam Vitals and nursing note reviewed.  Constitutional:      General: He is not in acute distress.    Appearance: Normal appearance. He is obese. He is not ill-appearing, toxic-appearing or diaphoretic.  HENT:     Head: Normocephalic.     Nose: Nose normal.     Mouth/Throat:     Mouth: Mucous membranes are moist.  Eyes:     Extraocular Movements: Extraocular movements intact.     Conjunctiva/sclera: Conjunctivae normal.     Pupils: Pupils are equal, round, and reactive to light.     Comments: Downward gaze  Cardiovascular:     Rate and Rhythm: Normal rate and regular rhythm.     Heart sounds: No murmur.  Pulmonary:     Breath sounds: No wheezing, rhonchi or rales.  Abdominal:     General: Bowel sounds  are normal. There is no distension.     Palpations: Abdomen is soft.     Tenderness: There is no abdominal tenderness. There is no right CVA tenderness, left CVA tenderness, guarding or rebound.  Musculoskeletal:     Cervical back: Normal range of motion and neck supple.     Right lower leg: No edema.     Left lower leg: No edema.  Skin:    General: Skin is warm and dry.  Neurological:     General: No focal deficit present.     Mental Status: He is alert. Mental status is at baseline.     Motor: No weakness.     Coordination: Coordination abnormal.     Gait: Gait abnormal.     Comments: Oriented to person, place. Unsteady/gait disorder/frequent falling. Moves slow  Psychiatric:     Comments: Flat affect is new.      Labs reviewed: Recent Labs    05/28/19 0000 06/30/19 0000 10/14/19 0000  NA 141 139 141  K 4.0 4.1 3.8  CL 106  --  106  CO2 29  --  24*  BUN '10 14 14  '$ CREATININE 0.8 0.8 0.7  CALCIUM 8.8  --  8.6*   Recent Labs    03/27/19 0000 05/28/19 0000  06/30/19 0000  AST 10* 10* 8*  ALT 15 4* 4*  ALKPHOS  --  94 116  PROT  --  5.9  --   ALBUMIN  --  3.7  --    Recent Labs    05/28/19 0000 06/30/19 0000 10/14/19 0000  WBC 6.4 6.4 6.6  NEUTROABS  --  3,514  --   HGB 12.9* 12.9* 12.5*  HCT 41 40* 38*  PLT 207 209 219   Lab Results  Component Value Date   TSH 2.51 03/27/2019   Lab Results  Component Value Date   HGBA1C 5.8 10/14/2019   Lab Results  Component Value Date   CHOL 27 10/14/2019   HDL 27 (A) 10/14/2019   LDLCALC 43 10/14/2019   TRIG 134 10/14/2019   CHOLHDL 3.1 08/04/2018    Significant Diagnostic Results in last 30 days:  DG Thoracic Spine 2 View  Result Date: 10/26/2019 CLINICAL DATA:  Recent fall with back pain, initial encounter EXAM: THORACIC SPINE 2 VIEWS COMPARISON:  None. FINDINGS: Vertebral body height is well maintained. Multilevel osteophytic changes are seen. No paraspinal mass lesion is noted. No pedicle abnormality is seen. Visualize ribcage is within normal limits. IMPRESSION: Multilevel degenerative change without acute abnormality. Electronically Signed   By: Inez Catalina M.D.   On: 10/26/2019 15:44   DG Lumbar Spine Complete  Result Date: 10/26/2019 CLINICAL DATA:  Recent fall with low back pain, initial encounter EXAM: LUMBAR SPINE - COMPLETE 4+ VIEW COMPARISON:  None. FINDINGS: Five lumbar type vertebral bodies are well visualized. Vertebral body height is well maintained. There is suspicion of fractures involving the second and third transverse processes on right best noted on the frontal film. Mild facet hypertrophic changes are seen. Osteophytic changes are noted in the upper lumbar spine. No acute soft tissue abnormality is seen. Nonobstructing left renal stone is noted. IMPRESSION: Changes suspicious for L2 and L3 transverse process fractures. Tiny lower pole left renal stone. Electronically Signed   By: Inez Catalina M.D.   On: 10/26/2019 15:40   CT Head Wo Contrast  Result Date:  10/26/2019 CLINICAL DATA:  Fall from standing position. Posterior head pain with laceration. No blood thinners. EXAM:  CT HEAD WITHOUT CONTRAST TECHNIQUE: Contiguous axial images were obtained from the base of the skull through the vertex without intravenous contrast. COMPARISON:  CT head 10/11/2019 FINDINGS: Brain: There is no evidence of acute intracranial hemorrhage, mass lesion, brain edema or extra-axial fluid collection. The ventricles and subarachnoid spaces are appropriately sized for age. There is no CT evidence of acute cortical infarction. Vascular: Intracranial vascular calcifications. No hyperdense vessel identified. Skull: Negative for fracture or focal lesion. Sinuses/Orbits: The visualized paranasal sinuses and mastoid air cells are clear. No orbital abnormalities are seen. Other: None. IMPRESSION: Stable unremarkable noncontrast head CT. Electronically Signed   By: Richardean Sale M.D.   On: 10/26/2019 13:41   CT Head Wo Contrast  Result Date: 10/11/2019 CLINICAL DATA:  Status post trauma. EXAM: CT HEAD WITHOUT CONTRAST TECHNIQUE: Contiguous axial images were obtained from the base of the skull through the vertex without intravenous contrast. COMPARISON:  March 26, 2019 FINDINGS: Brain: There is mild cerebral atrophy with widening of the extra-axial spaces and ventricular dilatation. There are areas of decreased attenuation within the white matter tracts of the supratentorial brain, consistent with microvascular disease changes. Vascular: No hyperdense vessel or unexpected calcification. Skull: Normal. Negative for fracture or focal lesion. Sinuses/Orbits: No acute finding. Other: None. IMPRESSION: No acute intracranial pathology. Electronically Signed   By: Virgina Norfolk M.D.   On: 10/11/2019 16:06   CT Cervical Spine Wo Contrast  Result Date: 10/11/2019 CLINICAL DATA:  Status post trauma. EXAM: CT CERVICAL SPINE WITHOUT CONTRAST TECHNIQUE: Multidetector CT imaging of the cervical spine was  performed without intravenous contrast. Multiplanar CT image reconstructions were also generated. COMPARISON:  April 05, 2019 FINDINGS: Alignment: Normal. Skull base and vertebrae: No acute fracture. No primary bone lesion or focal pathologic process. Soft tissues and spinal canal: No prevertebral fluid or swelling. No visible canal hematoma. Disc levels: C2-3: There is moderate severity end plate spondylosis. Mild disc space narrowing is seen. Bilateral facet hypertrophy is noted. Normal central canal and intervertebral neuroforamina. C3-4: There is mild end plate spondylosis. Mild disc space narrowing is seen. Bilateral facet hypertrophy is noted. Normal central canal and intervertebral neuroforamina. C4-5: There is moderate severity end plate spondylosis. Mild disc space narrowing is seen. Bilateral facet hypertrophy is noted. Normal central canal and intervertebral neuroforamina. C5-6: There is moderate severity end plate spondylosis. Moderate severity disc space narrowing is seen. Bilateral facet hypertrophy is noted. Normal central canal and intervertebral neuroforamina. C6-7: There is moderate severity end plate spondylosis. Moderate severity disc space narrowing is seen. Bilateral facet hypertrophy is noted. Normal central canal and intervertebral neuroforamina. C7-T1: There is mild end plate spondylosis. Moderate severity disc space narrowing is seen. Bilateral facet hypertrophy is noted. Normal central canal and intervertebral neuroforamina. Upper chest: Negative. Other: None. IMPRESSION: 1. Multilevel degenerative changes, without evidence of an acute osseous abnormality. Electronically Signed   By: Virgina Norfolk M.D.   On: 10/11/2019 16:09    Assessment/Plan Anxiety reported anxiety, agitation 11/08/19 twirling his thumbs, trying to get up w/o assistance, restless, yelling out staff names even when staff have just given assistance, the patient said I don't know when he was asked what was wrong.  [place] 11/09/19 when the patient was found on the floor lying on his left side. Will try Sertraline '25mg'$  qd, update CBC/diff, CMP/eGFR    Recurrent falls reported anxiety, agitation 11/08/19 twirling his thumbs, trying to get up w/o assistance, restless, yelling out staff names even when staff have just given assistance, the  patient said I don't know when he was asked what was wrong. [place] 11/09/19 when the patient was found on the floor lying on his left side. Poor safety awareness, unsteady gait are contributory, close supervision/assistance for safety.    Hypertension Loose controlled blood pressure, continue Metoprolol.   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   Cognitive impairment Continue SNF [facility] for safety, care assistance, close supervision for safety, continue Donepezil.   Constipation Stable, continue Senokot S     Family/ staff Communication: plan of care reviewed with the patient and charge nurse.   Labs/tests ordered:  CBC/diff, CMP/eGFR  Time spend 25 minutes.

## 2019-11-09 NOTE — Assessment & Plan Note (Signed)
reported anxiety, agitation 11/08/19 twirling his thumbs, trying to get up w/o assistance, restless, yelling out staff names even when staff have just given assistance, the patient said I don't know when he was asked what was wrong. [place] 11/09/19 when the patient was found on the floor lying on his left side. Will try Sertraline 77m qd, update CBC/diff, CMP/eGFR

## 2019-11-09 NOTE — Assessment & Plan Note (Signed)
Loose controlled blood pressure, continue Metoprolol.

## 2019-11-09 NOTE — Assessment & Plan Note (Signed)
Stable, continue Senokot S 

## 2019-11-10 LAB — CBC AND DIFFERENTIAL
HCT: 42 (ref 41–53)
Hemoglobin: 13.4 — AB (ref 13.5–17.5)
Neutrophils Absolute: 3615
Platelets: 225 (ref 150–399)
WBC: 6.2

## 2019-11-10 LAB — HEPATIC FUNCTION PANEL
ALT: 11 (ref 10–40)
AST: 11 — AB (ref 14–40)
Alkaline Phosphatase: 95 (ref 25–125)
Bilirubin, Total: 0.8

## 2019-11-10 LAB — COMPREHENSIVE METABOLIC PANEL
Albumin: 3.7 (ref 3.5–5.0)
Calcium: 8.7 (ref 8.7–10.7)
GFR calc Af Amer: 111
GFR calc non Af Amer: 96
Globulin: 2.6

## 2019-11-10 LAB — CBC: RBC: 4.65 (ref 3.87–5.11)

## 2019-11-10 LAB — BASIC METABOLIC PANEL
BUN: 11 (ref 4–21)
CO2: 27 — AB (ref 13–22)
Chloride: 106 (ref 99–108)
Creatinine: 0.8 (ref 0.6–1.3)
Glucose: 108
Potassium: 4.2 (ref 3.4–5.3)
Sodium: 140 (ref 137–147)

## 2019-11-12 LAB — BASIC METABOLIC PANEL
BUN: 9 (ref 4–21)
CO2: 29 — AB (ref 13–22)
Chloride: 105 (ref 99–108)
Creatinine: 0.7 (ref 0.6–1.3)
Glucose: 118
Potassium: 4.2 (ref 3.4–5.3)
Sodium: 140 (ref 137–147)

## 2019-11-12 LAB — CBC AND DIFFERENTIAL
HCT: 44 (ref 41–53)
Hemoglobin: 14.2 (ref 13.5–17.5)
Neutrophils Absolute: 4522
Platelets: 252 (ref 150–399)
WBC: 6.8

## 2019-11-12 LAB — CBC: RBC: 4.93 (ref 3.87–5.11)

## 2019-11-12 LAB — COMPREHENSIVE METABOLIC PANEL
Albumin: 4.1 (ref 3.5–5.0)
Calcium: 8.9 (ref 8.7–10.7)
GFR calc Af Amer: 113
GFR calc non Af Amer: 97
Globulin: 2.4

## 2019-11-12 LAB — HEPATIC FUNCTION PANEL
ALT: 12 (ref 10–40)
AST: 10 — AB (ref 14–40)
Alkaline Phosphatase: 106 (ref 25–125)
Bilirubin, Total: 0.9

## 2019-11-16 ENCOUNTER — Non-Acute Institutional Stay (SKILLED_NURSING_FACILITY): Payer: Medicare Other | Admitting: Nurse Practitioner

## 2019-11-16 ENCOUNTER — Encounter: Payer: Self-pay | Admitting: Nurse Practitioner

## 2019-11-16 DIAGNOSIS — R296 Repeated falls: Secondary | ICD-10-CM | POA: Diagnosis not present

## 2019-11-16 DIAGNOSIS — R2681 Unsteadiness on feet: Secondary | ICD-10-CM | POA: Diagnosis not present

## 2019-11-16 DIAGNOSIS — G231 Progressive supranuclear ophthalmoplegia [Steele-Richardson-Olszewski]: Secondary | ICD-10-CM

## 2019-11-16 DIAGNOSIS — F419 Anxiety disorder, unspecified: Secondary | ICD-10-CM | POA: Diagnosis not present

## 2019-11-16 DIAGNOSIS — I10 Essential (primary) hypertension: Secondary | ICD-10-CM

## 2019-11-16 DIAGNOSIS — R4189 Other symptoms and signs involving cognitive functions and awareness: Secondary | ICD-10-CM

## 2019-11-16 NOTE — Progress Notes (Signed)
Location:   [facility] Room Number: 4 Place of Service:  SNF (31) Provider:  Kinnie Kaupp, NP  Mahlon Gammon, MD  Patient Care Team: Mahlon Gammon, MD as PCP - General (Internal Medicine) Felton Buczynski X, NP as Nurse Practitioner (Internal Medicine)  Extended Emergency Contact Information Primary Emergency Contact: Obrien,Frank Address: [address]          [place], [place] [address] Darden [place] of [place] Home Phone: [phone] Mobile Phone: [phone] Relation: Brother  Code Status:  DNR Goals of care: Advanced Directive information Advanced Directives 11/09/2019  Does Patient Have a Medical Advance Directive? Yes  Type of Advance Directive Out of facility DNR (pink MOST or yellow form)  Does patient want to make changes to medical advance directive? No - Patient declined  Copy of Healthcare Power of Attorney in Chart? -  Would patient like information on creating a medical advance directive? -  Pre-existing out of facility DNR order (yellow form or pink MOST form) Yellow form placed in chart (order not valid for inpatient use)     Chief Complaint  Patient presents with  . Acute Visit    Fall    HPI:  Pt is a 64 y.o. male seen today for an acute visit for reported fall again 11/14/19, no apparent injury. The patient is poor with safety awareness, his gait/balance persistently getting worse related to his PSP, on Sinemet, Requip,  under Neurology care. His memory is preserved on Donepezil. His blood pressure is loose controlled on Metoprolol 25mg  qd. His mood is improved on Sertraline.    Past Medical History:  Diagnosis Date  . Adult onset fluency disorder   . Coronary artery disease    a. s/p stent left anterior descending artery remotely (per 2017 note, "about 4 years" prior). b. Normal nuc 05/2015.  . Diabetes mellitus type 2, controlled (HCC) 01/18/2016  . Dysphagia 2018  . Gait instability 2018  . History of falling   . Hypercholesterolemia    . Hypertension   . Memory change 2018  . Parkinson's disease (HCC) 2018  . Weight loss 2017   intentional   Past Surgical History:  Procedure Laterality Date  . CAROTID STENT  2012   in left anterior descending artery past EF 55%  . nuclear stress test  06/17/2015   Gated SPECT images reveal normal LV systolic function. EF 55%. Comparing the rest & stress SPECT images, no ischemia. Gated images are normal. Dr. 06/19/2015, MD Vernell Barrier, Lacy Duverney     Allergies  Allergen Reactions  . Codeine     Unknown     Allergies as of 11/16/2019      Reactions   Codeine    Unknown       Medication List       Accurate as of November 16, 2019 11:59 PM. If you have any questions, ask your nurse or doctor.        STOP taking these medications   NON FORMULARY Stopped by: Tryone Kille X Emmalina Espericueta, NP     TAKE these medications   acetaminophen 325 MG tablet Commonly known as: TYLENOL Take 650 mg by mouth every 4 (four) hours as needed. ENDS 11/17/2019   aspirin 81 MG chewable tablet Chew 81 mg by mouth daily. 7am-10am.   atorvastatin 10 MG tablet Commonly known as: LIPITOR Take 10 mg by mouth daily. 7pm-10pm   carbidopa-levodopa 25-100 MG tablet Commonly known as: SINEMET IR Take 2 tablets by mouth 4 (  four) times daily. 7am,12pm,4pm,9:30pm   donepezil 5 MG tablet Commonly known as: ARICEPT Take 5 mg by mouth at bedtime. For parkinson's disease   Metoprolol Succinate 25 MG Cs24 Take 25 mg by mouth daily. 7am-10am   rOPINIRole 0.25 MG tablet Commonly known as: REQUIP Take 0.25 mg by mouth at bedtime. 7pm-10pm.   sennosides-docusate sodium 8.6-50 MG tablet Commonly known as: SENOKOT-S Take 1 tablet by mouth every other day. 8pm.   sertraline 25 MG tablet Commonly known as: ZOLOFT Take 25 mg by mouth daily.      ROS was provided with assistance of staff.  Review of Systems  Constitutional: Negative for activity change, appetite change, fatigue and fever.  HENT: Negative for congestion  and voice change.   Eyes: Negative for visual disturbance.  Respiratory: Positive for cough. Negative for shortness of breath and wheezing.        Chronic cough with eating.   Cardiovascular: Negative for chest pain, palpitations and leg swelling.  Gastrointestinal: Negative for abdominal distention, abdominal pain, constipation, nausea and vomiting.  Genitourinary: Negative for difficulty urinating, dysuria and urgency.  Musculoskeletal: Positive for gait problem.  Skin: Negative for color change and pallor.  Neurological: Negative for dizziness, tremors, syncope, facial asymmetry, speech difficulty and headaches.       Memory lapses. Mild muscle rigidity in all limbs.   Psychiatric/Behavioral: Negative for agitation, behavioral problems and sleep disturbance. The patient is not nervous/anxious.     Immunization History  Administered Date(s) Administered  . Influenza Whole 06/20/2018  . Influenza, High Dose Seasonal PF 06/20/2019  . Influenza-Unspecified 07/08/2017  . PFIZER SARS-COV-2 Vaccination 09/19/2019, 10/17/2019  . PPD Test 01/15/2017  . Pneumococcal Polysaccharide-23 08/05/2018  . Td 09/17/2016   Pertinent  Health Maintenance Due  Topic Date Due  . URINE MICROALBUMIN  06/03/1964  . COLONOSCOPY  06/03/2004  . OPHTHALMOLOGY EXAM  10/08/2017  . FOOT EXAM  01/21/2018  . PNA vac Low Risk Adult (1 of 2 - PCV13) 08/06/2019  . HEMOGLOBIN A1C  04/12/2020  . INFLUENZA VACCINE  Completed   Fall Risk  01/21/2017  Falls in the past year? Yes  Number falls in past yr: 2 or more  Injury with Fall? Yes  Risk Factor Category  High Fall Risk  Risk for fall due to : History of fall(s);Impaired balance/gait;Impaired mobility  Follow up Falls evaluation completed   Functional Status Survey:    Vitals:   11/16/19 1520  BP: (!) 158/82  Pulse: 60  Resp: 20  Temp: 99 F (37.2 C)  SpO2: 94%  Weight: 225 lb 1.6 oz (102.1 kg)  Height: 5\' 11"  (1.803 m)   Body mass index is 31.4  kg/m. Physical Exam Vitals and nursing note reviewed.  Constitutional:      General: He is not in acute distress.    Appearance: Normal appearance. He is not ill-appearing.  HENT:     Head: Normocephalic.     Nose: Nose normal.     Mouth/Throat:     Mouth: Mucous membranes are moist.  Eyes:     Extraocular Movements: Extraocular movements intact.     Conjunctiva/sclera: Conjunctivae normal.     Pupils: Pupils are equal, round, and reactive to light.     Comments: Downward gaze  Cardiovascular:     Rate and Rhythm: Normal rate and regular rhythm.     Heart sounds: No murmur.  Pulmonary:     Breath sounds: No wheezing, rhonchi or rales.  Abdominal:  General: Bowel sounds are normal. There is no distension.     Palpations: Abdomen is soft.     Tenderness: There is no abdominal tenderness. There is no guarding or rebound.  Musculoskeletal:     Cervical back: Normal range of motion and neck supple.     Right lower leg: No edema.     Left lower leg: No edema.  Skin:    General: Skin is warm and dry.  Neurological:     General: No focal deficit present.     Mental Status: He is alert. Mental status is at baseline.     Motor: No weakness.     Coordination: Coordination abnormal.     Gait: Gait abnormal.     Comments: Oriented to person, place. Unsteady/gait disorder/frequent falling. Moves slow  Psychiatric:     Comments: Smiled, conversing.      Labs reviewed: Recent Labs    10/14/19 0000 11/10/19 0000 11/12/19 0000  NA 141 140 140  K 3.8 4.2 4.2  CL 106 106 105  CO2 24* 27* 29*  BUN 14 11 9   CREATININE 0.7 0.8 0.7  CALCIUM 8.6* 8.7 8.9   Recent Labs    05/28/19 0000 05/28/19 0000 06/30/19 0000 11/10/19 0000 11/12/19 0000  AST 10*   < > 8* 11* 10*  ALT 4*   < > 4* 11 12  ALKPHOS 94   < > 116 95 106  PROT 5.9  --   --   --   --   ALBUMIN 3.7  --   --  3.7 4.1   < > = values in this interval not displayed.   Recent Labs    06/30/19 0000 06/30/19  0000 10/14/19 0000 11/10/19 0000 11/12/19 0000  WBC 6.4   < > 6.6 6.2 6.8  NEUTROABS 3,514  --   --  3,615 4,522  HGB 12.9*   < > 12.5* 13.4* 14.2  HCT 40*   < > 38* 42 44  PLT 209   < > 219 225 252   < > = values in this interval not displayed.   Lab Results  Component Value Date   TSH 2.51 03/27/2019   Lab Results  Component Value Date   HGBA1C 5.8 10/14/2019   Lab Results  Component Value Date   CHOL 27 10/14/2019   HDL 27 (A) 10/14/2019   LDLCALC 43 10/14/2019   TRIG 134 10/14/2019   CHOLHDL 3.1 08/04/2018    Significant Diagnostic Results in last 30 days:  DG Thoracic Spine 2 View  Result Date: 10/26/2019 CLINICAL DATA:  Recent fall with back pain, initial encounter EXAM: THORACIC SPINE 2 VIEWS COMPARISON:  None. FINDINGS: Vertebral body height is well maintained. Multilevel osteophytic changes are seen. No paraspinal mass lesion is noted. No pedicle abnormality is seen. Visualize ribcage is within normal limits. IMPRESSION: Multilevel degenerative change without acute abnormality. Electronically Signed   By: 12/24/2019 M.D.   On: 10/26/2019 15:44   DG Lumbar Spine Complete  Result Date: 10/26/2019 CLINICAL DATA:  Recent fall with low back pain, initial encounter EXAM: LUMBAR SPINE - COMPLETE 4+ VIEW COMPARISON:  None. FINDINGS: Five lumbar type vertebral bodies are well visualized. Vertebral body height is well maintained. There is suspicion of fractures involving the second and third transverse processes on right best noted on the frontal film. Mild facet hypertrophic changes are seen. Osteophytic changes are noted in the upper lumbar spine. No acute soft tissue abnormality is seen. Nonobstructing  left renal stone is noted. IMPRESSION: Changes suspicious for L2 and L3 transverse process fractures. Tiny lower pole left renal stone. Electronically Signed   By: Alcide Clever M.D.   On: 10/26/2019 15:40   CT Head Wo Contrast  Result Date: 10/26/2019 CLINICAL DATA:  Fall from  standing position. Posterior head pain with laceration. No blood thinners. EXAM: CT HEAD WITHOUT CONTRAST TECHNIQUE: Contiguous axial images were obtained from the base of the skull through the vertex without intravenous contrast. COMPARISON:  CT head 10/11/2019 FINDINGS: Brain: There is no evidence of acute intracranial hemorrhage, mass lesion, brain edema or extra-axial fluid collection. The ventricles and subarachnoid spaces are appropriately sized for age. There is no CT evidence of acute cortical infarction. Vascular: Intracranial vascular calcifications. No hyperdense vessel identified. Skull: Negative for fracture or focal lesion. Sinuses/Orbits: The visualized paranasal sinuses and mastoid air cells are clear. No orbital abnormalities are seen. Other: None. IMPRESSION: Stable unremarkable noncontrast head CT. Electronically Signed   By: Carey Bullocks M.D.   On: 10/26/2019 13:41    Assessment/Plan Recurrent falls Reported fall 11/14/19,  no apparent injury. The patient is poor with safety awareness, his gait/balance persistently getting worse related to his PSP. Continue SN[facility] for safety, care assistance, intensive supervision/assistance needed for safety.   Unsteady gait Worse, related to PSP, closer supervision/assistance for transfer.   Cognitive impairment Progressing, continue SN[facility] for safety, care assistance, continue Donepezil.   Anxiety His mood is stabilizing, continue Sertraline.   Progressive supranuclear palsy (HCC) Progressing, needs more assistance with transfer, ADLs, continue Sinemet, Requip.   Hypertension Blood pressure is loose controlled, continue Metoprolol 25mg  qd .     Family/ staff Communication: plan of care reviewed with the patient and charge nurse.   Labs/tests ordered:  none  Time spend 25 minutes.

## 2019-11-20 ENCOUNTER — Encounter: Payer: Self-pay | Admitting: Nurse Practitioner

## 2019-11-20 NOTE — Assessment & Plan Note (Signed)
Progressing, needs more assistance with transfer, ADLs, continue Sinemet, Requip.

## 2019-11-20 NOTE — Assessment & Plan Note (Signed)
Progressing, continue SN[facility] for safety, care assistance, continue Donepezil.

## 2019-11-20 NOTE — Assessment & Plan Note (Signed)
His mood is stabilizing, continue Sertraline.  

## 2019-11-20 NOTE — Assessment & Plan Note (Signed)
Worse, related to PSP, closer supervision/assistance for transfer.

## 2019-11-20 NOTE — Assessment & Plan Note (Signed)
Reported fall 11/14/19,  no apparent injury. The patient is poor with safety awareness, his gait/balance persistently getting worse related to his PSP. Continue SN[facility] for safety, care assistance, intensive supervision/assistance needed for safety.

## 2019-11-20 NOTE — Assessment & Plan Note (Signed)
Blood pressure is loose controlled, continue Metoprolol 25mg  qd .

## 2019-11-23 ENCOUNTER — Encounter: Payer: Self-pay | Admitting: Nurse Practitioner

## 2019-11-23 ENCOUNTER — Non-Acute Institutional Stay (SKILLED_NURSING_FACILITY): Payer: Medicare Other | Admitting: Nurse Practitioner

## 2019-11-23 DIAGNOSIS — G231 Progressive supranuclear ophthalmoplegia [Steele-Richardson-Olszewski]: Secondary | ICD-10-CM | POA: Diagnosis not present

## 2019-11-23 DIAGNOSIS — I10 Essential (primary) hypertension: Secondary | ICD-10-CM | POA: Diagnosis not present

## 2019-11-23 DIAGNOSIS — R296 Repeated falls: Secondary | ICD-10-CM | POA: Diagnosis not present

## 2019-11-23 DIAGNOSIS — R4189 Other symptoms and signs involving cognitive functions and awareness: Secondary | ICD-10-CM | POA: Diagnosis not present

## 2019-11-23 NOTE — Progress Notes (Signed)
Location:   [facility] Room Number: 4 Place of Service:  SNF (31) Provider:  Mazin Emma, NP  Mahlon Gammon, MD  Patient Care Team: Mahlon Gammon, MD as PCP - General (Internal Medicine) Sheniya Garciaperez X, NP as Nurse Practitioner (Internal Medicine)  Extended Emergency Contact Information Primary Emergency Contact: Prest,Frank Address: [address]          [place], [place] [address] D[place] of [place] Home Phone: [phone] Mobile Phone: [phone] Relation: Brother  Code Status:  DNR Goals of care: Advanced Directive information Advanced Directives 11/23/2019  Does Patient Have a Medical Advance Directive? Yes  Type of Estate agent of [place];Out of facility DNR (pink MOST or yellow form)  Does patient want to make changes to medical advance directive? No - Patient declined  Copy of Healthcare Power of Attorney in Chart? Yes - validated most recent copy scanned in chart (See row information)  Would patient like information on creating a medical advance directive? -  Pre-existing out of facility DNR order (yellow form or pink MOST form) -     Chief Complaint  Patient presents with  . Acute Visit    Fall    HPI:  Pt is a 64 y.o. male seen today for an acute visit for  reported fall 11/22/19 when the patient was found on the floor besides of his nightstand with his head leaning on the wall, attempted transfering self w/o calling for assistance. Old bruise on left buttock from previous fall.  Lack of safety awareness, unsteady gait/balance/frailty are contributory. Hx of PSP, worsening in gait, balance, on Sinemet, Requip. The patient resides in [facility] for safety, care assistance, on Donepezil for memory, His blood pressure is controlled, on Metoprolol 25mg  qd.     Past Medical History:  Diagnosis Date  . Adult onset fluency disorder   . Coronary artery disease    a. s/p stent left anterior descending artery remotely (per 2017  note, "about 4 years" prior). b. Normal nuc 05/2015.  . Diabetes mellitus type 2, controlled (HCC) 01/18/2016  . Dysphagia 2018  . Gait instability 2018  . History of falling   . Hypercholesterolemia   . Hypertension   . Memory change 2018  . Parkinson's disease (HCC) 2018  . Weight loss 2017   intentional   Past Surgical History:  Procedure Laterality Date  . CAROTID STENT  2012   in left anterior descending artery past EF 55%  . nuclear stress test  06/17/2015   Gated SPECT images reveal normal LV systolic function. EF 55%. Comparing the rest & stress SPECT images, no ischemia. Gated images are normal. Dr. 06/19/2015, MD Vernell Barrier, Lacy Duverney     Allergies  Allergen Reactions  . Codeine     Unknown     Allergies as of 11/23/2019      Reactions   Codeine    Unknown       Medication List       Accurate as of November 23, 2019  1:57 PM. If you have any questions, ask your nurse or doctor.        STOP taking these medications   acetaminophen 325 MG tablet Commonly known as: TYLENOL Stopped by: Kristyanna Barcelo X Avantae Bither, NP     TAKE these medications   aspirin 81 MG chewable tablet Chew 81 mg by mouth daily. 7am-10am.   atorvastatin 10 MG tablet Commonly known as: LIPITOR Take 10 mg by mouth daily. 7pm-10pm  carbidopa-levodopa 25-100 MG tablet Commonly known as: SINEMET IR Take 2 tablets by mouth 4 (four) times daily. 7am,12pm,4pm,9:30pm   donepezil 5 MG tablet Commonly known as: ARICEPT Take 5 mg by mouth at bedtime. For parkinson's disease   Metoprolol Succinate 25 MG Cs24 Take 25 mg by mouth daily. 7am-10am   rOPINIRole 0.25 MG tablet Commonly known as: REQUIP Take 0.25 mg by mouth at bedtime. 7pm-10pm.   sennosides-docusate sodium 8.6-50 MG tablet Commonly known as: SENOKOT-S Take 1 tablet by mouth every other day. 8pm.   sertraline 25 MG tablet Commonly known as: ZOLOFT Take 25 mg by mouth daily.       Review of Systems  Constitutional: Negative for activity  change, appetite change, chills, diaphoresis, fatigue and fever.  HENT: Negative for congestion and voice change.   Eyes: Negative for visual disturbance.  Respiratory: Positive for cough. Negative for shortness of breath.        Chronic cough with eating.   Cardiovascular: Negative for chest pain, palpitations and leg swelling.  Gastrointestinal: Negative for abdominal distention, abdominal pain, constipation, nausea and vomiting.  Genitourinary: Negative for difficulty urinating, dysuria and urgency.  Musculoskeletal: Positive for gait problem.  Skin: Negative for color change and pallor.       Left buttock bruise.   Neurological: Negative for dizziness, tremors, syncope, facial asymmetry, speech difficulty and headaches.       Memory lapses. Mild muscle rigidity in all limbs.   Psychiatric/Behavioral: Negative for agitation, behavioral problems and sleep disturbance. The patient is not nervous/anxious.     Immunization History  Administered Date(s) Administered  . Influenza Whole 06/20/2018  . Influenza, High Dose Seasonal PF 06/20/2019  . Influenza-Unspecified 07/08/2017  . PFIZER SARS-COV-2 Vaccination 09/19/2019, 10/17/2019  . PPD Test 01/15/2017  . Pneumococcal Polysaccharide-23 08/05/2018  . Td 09/17/2016   Pertinent  Health Maintenance Due  Topic Date Due  . URINE MICROALBUMIN  06/03/1964  . COLONOSCOPY  06/03/2004  . OPHTHALMOLOGY EXAM  10/08/2017  . FOOT EXAM  01/21/2018  . PNA vac Low Risk Adult (1 of 2 - PCV13) 08/06/2019  . HEMOGLOBIN A1C  04/12/2020  . INFLUENZA VACCINE  Completed   Fall Risk  01/21/2017  Falls in the past year? Yes  Number falls in past yr: 2 or more  Injury with Fall? Yes  Risk Factor Category  High Fall Risk  Risk for fall due to : History of fall(s);Impaired balance/gait;Impaired mobility  Follow up Falls evaluation completed   Functional Status Survey:    Vitals:   11/23/19 1315  BP: 140/68  Pulse: 66  Resp: 20  Temp: 98 F (36.7  C)  SpO2: 93%  Weight: 225 lb 1.6 oz (102.1 kg)  Height: 5\' 11"  (1.803 m)   Body mass index is 31.4 kg/m. Physical Exam Vitals and nursing note reviewed.  Constitutional:      General: He is not in acute distress.    Appearance: Normal appearance. He is not ill-appearing.  HENT:     Head: Normocephalic.     Nose: Nose normal.     Mouth/Throat:     Mouth: Mucous membranes are moist.  Eyes:     Extraocular Movements: Extraocular movements intact.     Conjunctiva/sclera: Conjunctivae normal.     Pupils: Pupils are equal, round, and reactive to light.     Comments: Downward gaze  Cardiovascular:     Rate and Rhythm: Normal rate and regular rhythm.     Heart sounds: No murmur.  Pulmonary:     Breath sounds: No wheezing, rhonchi or rales.  Abdominal:     General: Bowel sounds are normal. There is no distension.     Palpations: Abdomen is soft.     Tenderness: There is no abdominal tenderness.  Musculoskeletal:     Cervical back: Normal range of motion and neck supple.     Right lower leg: No edema.     Left lower leg: No edema.  Skin:    General: Skin is warm and dry.     Findings: Bruising present.     Comments: Old left buttock bruise.   Neurological:     General: No focal deficit present.     Mental Status: He is alert. Mental status is at baseline.     Motor: No weakness.     Coordination: Coordination abnormal.     Gait: Gait abnormal.     Comments: Oriented to person, place. Unsteady/gait disorder/frequent falling. Moves slow  Psychiatric:        Mood and Affect: Mood normal.        Behavior: Behavior normal.     Comments: Smiled, conversing.      Labs reviewed: Recent Labs    10/14/19 0000 11/10/19 0000 11/12/19 0000  NA 141 140 140  K 3.8 4.2 4.2  CL 106 106 105  CO2 24* 27* 29*  BUN 14 11 9   CREATININE 0.7 0.8 0.7  CALCIUM 8.6* 8.7 8.9   Recent Labs    05/28/19 0000 05/28/19 0000 06/30/19 0000 11/10/19 0000 11/12/19 0000  AST 10*   < > 8*  11* 10*  ALT 4*   < > 4* 11 12  ALKPHOS 94   < > 116 95 106  PROT 5.9  --   --   --   --   ALBUMIN 3.7  --   --  3.7 4.1   < > = values in this interval not displayed.   Recent Labs    06/30/19 0000 06/30/19 0000 10/14/19 0000 11/10/19 0000 11/12/19 0000  WBC 6.4   < > 6.6 6.2 6.8  NEUTROABS 3,514  --   --  3,615 4,522  HGB 12.9*   < > 12.5* 13.4* 14.2  HCT 40*   < > 38* 42 44  PLT 209   < > 219 225 252   < > = values in this interval not displayed.   Lab Results  Component Value Date   TSH 2.51 03/27/2019   Lab Results  Component Value Date   HGBA1C 5.8 10/14/2019   Lab Results  Component Value Date   CHOL 27 10/14/2019   HDL 27 (A) 10/14/2019   LDLCALC 43 10/14/2019   TRIG 134 10/14/2019   CHOLHDL 3.1 08/04/2018    Significant Diagnostic Results in last 30 days:  DG Thoracic Spine 2 View  Result Date: 10/26/2019 CLINICAL DATA:  Recent fall with back pain, initial encounter EXAM: THORACIC SPINE 2 VIEWS COMPARISON:  None. FINDINGS: Vertebral body height is well maintained. Multilevel osteophytic changes are seen. No paraspinal mass lesion is noted. No pedicle abnormality is seen. Visualize ribcage is within normal limits. IMPRESSION: Multilevel degenerative change without acute abnormality. Electronically Signed   By: 12/24/2019 M.D.   On: 10/26/2019 15:44   DG Lumbar Spine Complete  Result Date: 10/26/2019 CLINICAL DATA:  Recent fall with low back pain, initial encounter EXAM: LUMBAR SPINE - COMPLETE 4+ VIEW COMPARISON:  None. FINDINGS: Five lumbar type vertebral bodies are  well visualized. Vertebral body height is well maintained. There is suspicion of fractures involving the second and third transverse processes on right best noted on the frontal film. Mild facet hypertrophic changes are seen. Osteophytic changes are noted in the upper lumbar spine. No acute soft tissue abnormality is seen. Nonobstructing left renal stone is noted. IMPRESSION: Changes suspicious for L2  and L3 transverse process fractures. Tiny lower pole left renal stone. Electronically Signed   By: Inez Catalina M.D.   On: 10/26/2019 15:40   CT Head Wo Contrast  Result Date: 10/26/2019 CLINICAL DATA:  Fall from standing position. Posterior head pain with laceration. No blood thinners. EXAM: CT HEAD WITHOUT CONTRAST TECHNIQUE: Contiguous axial images were obtained from the base of the skull through the vertex without intravenous contrast. COMPARISON:  CT head 10/11/2019 FINDINGS: Brain: There is no evidence of acute intracranial hemorrhage, mass lesion, brain edema or extra-axial fluid collection. The ventricles and subarachnoid spaces are appropriately sized for age. There is no CT evidence of acute cortical infarction. Vascular: Intracranial vascular calcifications. No hyperdense vessel identified. Skull: Negative for fracture or focal lesion. Sinuses/Orbits: The visualized paranasal sinuses and mastoid air cells are clear. No orbital abnormalities are seen. Other: None. IMPRESSION: Stable unremarkable noncontrast head CT. Electronically Signed   By: Richardean Sale M.D.   On: 10/26/2019 13:41    Assessment/Plan Recurrent falls reported fall 11/22/19 when the patient was found on the floor besides of his nightstand with his head leaning on the wall, attempted transfering self w/o calling for assistance. Old bruise on left buttock from previous fall.  Lack of safety awareness, unsteady gait/balance/frailty are contributory. Close supervision/assistance needed for safety.    Progressive supranuclear palsy (HCC) Worsening in gait and balance gradually, continue Sinemet, Requip.   Hypertension Blood pressure is controlled, continue Metoprolol.   Cognitive impairment Poor safety awareness is contributory to frequent falling, continue Donepezil for memory.      Family/ staff Communication: plan of care reviewed with the patient and charge nurse.   Labs/tests ordered:  none  Time spend 25  minutes.

## 2019-11-23 NOTE — Assessment & Plan Note (Signed)
Blood pressure is controlled, continue Metoprolol. 

## 2019-11-23 NOTE — Assessment & Plan Note (Signed)
reported fall 11/22/19 when the patient was found on the floor besides of his nightstand with his head leaning on the wall, attempted transfering self w/o calling for assistance. Old bruise on left buttock from previous fall.  Lack of safety awareness, unsteady gait/balance/frailty are contributory. Close supervision/assistance needed for safety.

## 2019-11-23 NOTE — Assessment & Plan Note (Signed)
Worsening in gait and balance gradually, continue Sinemet, Requip.

## 2019-11-23 NOTE — Assessment & Plan Note (Signed)
Poor safety awareness is contributory to frequent falling, continue Donepezil for memory.

## 2019-12-03 ENCOUNTER — Encounter: Payer: Self-pay | Admitting: Nurse Practitioner

## 2019-12-03 ENCOUNTER — Non-Acute Institutional Stay (SKILLED_NURSING_FACILITY): Payer: Medicare Other | Admitting: Nurse Practitioner

## 2019-12-03 DIAGNOSIS — M542 Cervicalgia: Secondary | ICD-10-CM | POA: Diagnosis not present

## 2019-12-03 DIAGNOSIS — F419 Anxiety disorder, unspecified: Secondary | ICD-10-CM

## 2019-12-03 DIAGNOSIS — R1312 Dysphagia, oropharyngeal phase: Secondary | ICD-10-CM

## 2019-12-03 DIAGNOSIS — G231 Progressive supranuclear ophthalmoplegia [Steele-Richardson-Olszewski]: Secondary | ICD-10-CM

## 2019-12-03 DIAGNOSIS — R4189 Other symptoms and signs involving cognitive functions and awareness: Secondary | ICD-10-CM

## 2019-12-03 DIAGNOSIS — R296 Repeated falls: Secondary | ICD-10-CM

## 2019-12-03 NOTE — Assessment & Plan Note (Signed)
Close supervision/assistance is needed. High risk of falling.

## 2019-12-03 NOTE — Assessment & Plan Note (Signed)
Down grade diet to pudding thick consistency per ST

## 2019-12-03 NOTE — Assessment & Plan Note (Signed)
Continue [facility] for safety, care assistance, Donepezil.

## 2019-12-03 NOTE — Assessment & Plan Note (Signed)
Neck pain with leftward and forward motion for a month, will schedule Tylenol 650mg  bid po, obtain 2 views of cervical spine x-ray to r/o fx. Observe.

## 2019-12-03 NOTE — Assessment & Plan Note (Signed)
Not well controlled, may consider increase Sertraline if better managing neck pain doesn't help his mood.

## 2019-12-03 NOTE — Assessment & Plan Note (Signed)
Progressing of balance issue, frequent falling, continue [facility] for safety, care assistance, continue Sinemet, Requip.

## 2019-12-03 NOTE — Progress Notes (Signed)
Location:   [facility] Room Number: 4 Place of Service:  SNF (31) Provider: Arna Snipe Shauntay Brunelli NP  Mahlon Gammon, MD  Patient Care Team: Mahlon Gammon, MD as PCP - General (Internal Medicine) Tayvien Kane X, NP as Nurse Practitioner (Internal Medicine)  Extended Emergency Contact Information Primary Emergency Contact: Zimny,Frank Address: [address]          [place], [place] [address] [place] of [place] Home Phone: [phone] Mobile Phone: [phone] Relation: Brother  Code Status: DNR Goals of care: Advanced Directive information Advanced Directives 12/03/2019  Does Patient Have a Medical Advance Directive? Yes  Type of Advance Directive Out of facility DNR (pink MOST or yellow form)  Does patient want to make changes to medical advance directive? No - Patient declined  Copy of Healthcare Power of Attorney in Chart? -  Would patient like information on creating a medical advance directive? -  Pre-existing out of facility DNR order (yellow form or pink MOST form) Yellow form placed in chart (order not valid for inpatient use)     Chief Complaint  Patient presents with  . Acute Visit    Skin rash    HPI:  Pt is a 64 y.o. male seen today for an acute visit for c/o neck pain, positional with leftward and forward movement, duration is about a month, no recollection of onset, pain is moderate, Tylenol and immobilize head relieve pain, denied weakness, tingling, numbness in BUE, no noted focal weakness. 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 On Sinemet, Requip, Donepezil. His mood is managed on Sertraline, reported anxiety, agitation, up and down in room w/o assistance.    Past Medical History:  Diagnosis Date  . Adult onset fluency disorder   . Coronary artery disease    a. s/p stent left anterior descending artery remotely (per 2017 note, "about 4 years" prior). b. Normal nuc 05/2015.  . Diabetes mellitus type 2, controlled  (HCC) 01/18/2016  . Dysphagia 2018  . Gait instability 2018  . History of falling   . Hypercholesterolemia   . Hypertension   . Memory change 2018  . Parkinson's disease (HCC) 2018  . Weight loss 2017   intentional   Past Surgical History:  Procedure Laterality Date  . CAROTID STENT  2012   in left anterior descending artery past EF 55%  . nuclear stress test  06/17/2015   Gated SPECT images reveal normal LV systolic function. EF 55%. Comparing the rest & stress SPECT images, no ischemia. Gated images are normal. Dr. Vernell Barrier, MD [place], [place]     Allergies  Allergen Reactions  . Codeine     Unknown     Allergies as of 12/03/2019      Reactions   Codeine    Unknown       Medication List       Accurate as of December 03, 2019 11:59 PM. If you have any questions, ask your nurse or doctor.        acetaminophen 325 MG tablet Commonly known as: TYLENOL Take 650 mg by mouth in the morning and at bedtime.   acetaminophen 325 MG tablet Commonly known as: TYLENOL Take 650 mg by mouth every 4 (four) hours as needed. Administer 650mg  every four hours PRN for minor pain/headache x 48 hours. Notify MD of continued pain/headache. Do not exceed 3,000 mg in 24 hours. Document area of discomfort and effectiveness of medication.   aspirin 81 MG  chewable tablet Chew 81 mg by mouth daily. 7am-10am.   atorvastatin 10 MG tablet Commonly known as: LIPITOR Take 10 mg by mouth daily. 7pm-10pm   carbidopa-levodopa 25-100 MG tablet Commonly known as: SINEMET IR Take 2 tablets by mouth 4 (four) times daily. 7am,12pm,4pm,9:30pm   donepezil 5 MG tablet Commonly known as: ARICEPT Take 5 mg by mouth at bedtime. For parkinson's disease   Metoprolol Succinate 25 MG Cs24 Take 25 mg by mouth daily. 7am-10am   rOPINIRole 0.25 MG tablet Commonly known as: REQUIP Take 0.25 mg by mouth at bedtime. 7pm-10pm.   sennosides-docusate sodium 8.6-50 MG tablet Commonly known as: SENOKOT-S Take  1 tablet by mouth every other day. 8pm.   sertraline 25 MG tablet Commonly known as: ZOLOFT Take 25 mg by mouth daily.      ROS was provided with assistance of staff.  Review of Systems  Constitutional: Negative for activity change, appetite change, fatigue and fever.  HENT: Negative for congestion and voice change.   Eyes: Negative for visual disturbance.  Respiratory: Positive for cough. Negative for shortness of breath.        Chronic cough with eating.   Cardiovascular: Negative for chest pain, palpitations and leg swelling.  Gastrointestinal: Negative for abdominal distention, abdominal pain and constipation.  Genitourinary: Negative for difficulty urinating, dysuria and urgency.  Musculoskeletal: Positive for gait problem and neck pain.  Skin: Negative for color change.  Neurological: Negative for dizziness, tremors, syncope, facial asymmetry, speech difficulty, weakness, light-headedness, numbness and headaches.       Memory lapses. Mild muscle rigidity in all limbs.   Psychiatric/Behavioral: Negative for agitation, behavioral problems and sleep disturbance. The patient is nervous/anxious.     Immunization History  Administered Date(s) Administered  . Influenza Whole 06/20/2018  . Influenza, High Dose Seasonal PF 06/20/2019  . Influenza-Unspecified 07/08/2017  . Moderna SARS-COVID-2 Vaccination 09/19/2019, 10/17/2019  . PFIZER SARS-COV-2 Vaccination 09/19/2019, 10/17/2019  . PPD Test 01/15/2017  . Pneumococcal Polysaccharide-23 08/05/2018  . Td 09/17/2016   Pertinent  Health Maintenance Due  Topic Date Due  . URINE MICROALBUMIN  Never done  . COLONOSCOPY  Never done  . OPHTHALMOLOGY EXAM  10/08/2017  . FOOT EXAM  01/21/2018  . PNA vac Low Risk Adult (1 of 2 - PCV13) 08/06/2019  . HEMOGLOBIN A1C  04/12/2020  . INFLUENZA VACCINE  Completed   Fall Risk  01/21/2017  Falls in the past year? Yes  Number falls in past yr: 2 or more  Injury with Fall? Yes  Risk Factor  Category  High Fall Risk  Risk for fall due to : History of fall(s);Impaired balance/gait;Impaired mobility  Follow up Falls evaluation completed   Functional Status Survey:    Vitals:   12/03/19 1307  BP: (!) 148/70  Pulse: 75  Resp: 18  Temp: 99 F (37.2 C)  SpO2: 96%  Weight: 225 lb 1.6 oz (102.1 kg)  Height: 5\' 11"  (1.803 m)   Body mass index is 31.4 kg/m. Physical Exam Vitals and nursing note reviewed.  Constitutional:      General: He is not in acute distress.    Appearance: Normal appearance. He is not ill-appearing, toxic-appearing or diaphoretic.  HENT:     Head: Normocephalic.     Nose: Nose normal.     Mouth/Throat:     Mouth: Mucous membranes are moist.  Eyes:     Extraocular Movements: Extraocular movements intact.     Conjunctiva/sclera: Conjunctivae normal.     Pupils: Pupils  are equal, round, and reactive to light.     Comments: Downward gaze  Neck:     Vascular: No carotid bruit.     Comments: Pain with Leftward and forward motion Cardiovascular:     Rate and Rhythm: Normal rate and regular rhythm.     Heart sounds: No murmur.  Pulmonary:     Breath sounds: No wheezing, rhonchi or rales.  Abdominal:     General: Bowel sounds are normal. There is no distension.     Palpations: Abdomen is soft.     Tenderness: There is no abdominal tenderness.  Musculoskeletal:        General: Tenderness present.     Cervical back: Normal range of motion and neck supple. Tenderness present. No rigidity.     Right lower leg: No edema.     Left lower leg: No edema.     Comments: Pain palpated at the 2nd 3rd cervical vertebral level, no spinous process tenderness noted.   Lymphadenopathy:     Cervical: No cervical adenopathy.  Skin:    General: Skin is warm and dry.     Findings: Bruising present.     Comments: Old left buttock bruise.   Neurological:     General: No focal deficit present.     Mental Status: He is alert. Mental status is at baseline.      Motor: No weakness.     Coordination: Coordination abnormal.     Gait: Gait abnormal.     Comments: Oriented to person, place. Unsteady/gait disorder/frequent falling. Moves slow  Psychiatric:        Mood and Affect: Mood normal.        Behavior: Behavior normal.     Comments: Smiled, conversing.      Labs reviewed: Recent Labs    10/14/19 0000 11/10/19 0000 11/12/19 0000  NA 141 140 140  K 3.8 4.2 4.2  CL 106 106 105  CO2 24* 27* 29*  BUN 14 11 9   CREATININE 0.7 0.8 0.7  CALCIUM 8.6* 8.7 8.9   Recent Labs    05/28/19 0000 05/28/19 0000 06/30/19 0000 11/10/19 0000 11/12/19 0000  AST 10*   < > 8* 11* 10*  ALT 4*   < > 4* 11 12  ALKPHOS 94   < > 116 95 106  PROT 5.9  --   --   --   --   ALBUMIN 3.7  --   --  3.7 4.1   < > = values in this interval not displayed.   Recent Labs    06/30/19 0000 06/30/19 0000 10/14/19 0000 11/10/19 0000 11/12/19 0000  WBC 6.4   < > 6.6 6.2 6.8  NEUTROABS 3,514  --   --  3,615 4,522  HGB 12.9*   < > 12.5* 13.4* 14.2  HCT 40*   < > 38* 42 44  PLT 209   < > 219 225 252   < > = values in this interval not displayed.   Lab Results  Component Value Date   TSH 2.51 03/27/2019   Lab Results  Component Value Date   HGBA1C 5.8 10/14/2019   Lab Results  Component Value Date   CHOL 27 10/14/2019   HDL 27 (A) 10/14/2019   LDLCALC 43 10/14/2019   TRIG 134 10/14/2019   CHOLHDL 3.1 08/04/2018    Significant Diagnostic Results in last 30 days:  No results found.  Assessment/Plan: Neck pain Neck pain with leftward and forward motion  for a month, will schedule Tylenol 650mg  bid po, obtain 2 views of cervical spine x-ray to r/o fx. Observe.   Recurrent falls Close supervision/assistance is needed. High risk of falling.   Anxiety Not well controlled, may consider increase Sertraline if better managing neck pain doesn't help his mood.   Dysphagia Down grade diet to pudding thick consistency per ST  Progressive supranuclear  palsy (HCC) Progressing of balance issue, frequent falling, continue SN[facility] for safety, care assistance, continue Sinemet, Requip.  Cognitive impairment Continue [facility] for safety, care assistance, Donepezil.     Family/ staff Communication: plan of care reviewed with the patient and charge nurse.   Labs/tests ordered:  X-ray cervical spine 2 views  Time spend 25 minutes.

## 2019-12-04 ENCOUNTER — Encounter: Payer: Self-pay | Admitting: Nurse Practitioner

## 2019-12-09 ENCOUNTER — Encounter: Payer: Self-pay | Admitting: Nurse Practitioner

## 2019-12-09 ENCOUNTER — Non-Acute Institutional Stay (SKILLED_NURSING_FACILITY): Payer: Medicare Other | Admitting: Nurse Practitioner

## 2019-12-09 DIAGNOSIS — K5900 Constipation, unspecified: Secondary | ICD-10-CM

## 2019-12-09 DIAGNOSIS — F419 Anxiety disorder, unspecified: Secondary | ICD-10-CM | POA: Diagnosis not present

## 2019-12-09 DIAGNOSIS — G231 Progressive supranuclear ophthalmoplegia [Steele-Richardson-Olszewski]: Secondary | ICD-10-CM

## 2019-12-09 DIAGNOSIS — R1312 Dysphagia, oropharyngeal phase: Secondary | ICD-10-CM

## 2019-12-09 DIAGNOSIS — R4189 Other symptoms and signs involving cognitive functions and awareness: Secondary | ICD-10-CM

## 2019-12-09 DIAGNOSIS — I10 Essential (primary) hypertension: Secondary | ICD-10-CM

## 2019-12-09 DIAGNOSIS — M542 Cervicalgia: Secondary | ICD-10-CM

## 2019-12-09 DIAGNOSIS — E119 Type 2 diabetes mellitus without complications: Secondary | ICD-10-CM

## 2019-12-09 NOTE — Assessment & Plan Note (Signed)
Will increased Senokot S to I qd/qod, encourage oral fluid intake will be helpful, observe.

## 2019-12-09 NOTE — Assessment & Plan Note (Signed)
Started pudding thick liquid 12/03/19, decreased oral fluid intake, dry mouth and sunken eyes noted, reported constipation as well, will encourage oral fluid intake, update CBC/diff, CMP/eGFR, risk for dehydration.

## 2019-12-09 NOTE — Assessment & Plan Note (Signed)
Continue [facility] for safety, care assistance, continue Donepezil °

## 2019-12-09 NOTE — Assessment & Plan Note (Signed)
11/11/19 HPOA declined colonoscopy, eye exam, foot exam, no heroic measures.

## 2019-12-09 NOTE — Assessment & Plan Note (Signed)
His mood is stabilizing, continue Sertraline.

## 2019-12-09 NOTE — Assessment & Plan Note (Signed)
Progressing, frequent falling, continue Sinemet, Requip.

## 2019-12-09 NOTE — Assessment & Plan Note (Signed)
Improved, continue Tylenol.

## 2019-12-09 NOTE — Progress Notes (Signed)
Location:   [facility] Room Number: 4 Place of Service:  SNF (31) Provider:  Malachai Schalk NP  [name], MD  Patient Care Team: [name], MD as PCP - General (Internal Medicine) Ben Habermann X, NP as Nurse Practitioner (Internal Medicine)  Extended Emergency Contact Information Primary Emergency Contact: Uehara,Frank Address: [place], [place] [address] [place] of [place] Phone: [phone] Mobile Phone: [phone] Relation: Brother  Code Status:  DNR Goals of care: Advanced Directive information Advanced Directives 12/09/2019  Does Patient Have a Medical Advance Directive? Yes  Type of Advance Directive Out of facility DNR (pink MOST or yellow form)  Does patient want to make changes to medical advance directive? No - Patient declined  Copy of [place] in Chart? -  Would patient like information on creating a medical advance directive? -  Pre-existing out of facility DNR order (yellow form or pink MOST form) Yellow form placed in chart (order not valid for inpatient use)     Chief Complaint  Patient presents with  . Medical Management of Chronic Issues  . Health Maintenance    Hep. C screening, urine microalbumin, colonoscopy, foot and eye exam, PCV13    HPI:  Pt is a 64 y.o. male seen today for medical management of chronic diseases.    The patient resides in SNF [facility] for safety, care assistance, w/c for mobility, on Donepezil for memory,  frequent falls,  PSP on Sinemet, Requip, his mood is stable on Sertraline '25mg'$  qd. Dysphagia, on pudding thick liquids, c/o not adequate fluid intake, constipation is not well controlled on Senokot S I qod. 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 HTN, blood pressure is controlled on Metoprolol '25mg'$  qd. Prediabetes, due for urine micro albumin, foot/eye exam. Health maintenance is due for colonoscopy, 11/11/19 HPOA declined colonoscopy, eye exam, foot  exam, no heroic measures.     Past Medical History:  Diagnosis Date  . Adult onset fluency disorder   . Coronary artery disease    a. s/p stent left anterior descending artery remotely (per 2017 note, "about 4 years" prior). b. Normal nuc 05/2015.  . Diabetes mellitus type 2, controlled ([place]) 01/18/2016  . Dysphagia 2018  . Gait instability 2018  . History of falling   . Hypercholesterolemia   . Hypertension   . Memory change 2018  . Parkinson's disease ([place]) 2018  . Weight loss 2017   intentional   Past Surgical History:  Procedure Laterality Date  . CAROTID STENT  2012   in left anterior descending artery past EF 55%  . nuclear stress test  06/17/2015   Gated SPECT images reveal normal LV systolic function. EF 55%. Comparing the rest & stress SPECT images, no ischemia. Gated images are normal. Dr. [name], MD [name], [name]     Allergies  Allergen Reactions  . Codeine     Unknown     Allergies as of 12/09/2019      Reactions   Codeine    Unknown       Medication List       Accurate as of December 09, 2019 11:10 AM. If you have any questions, ask your nurse or doctor.        acetaminophen 325 MG tablet Commonly known as: TYLENOL Take 650 mg by mouth in the morning and at bedtime. What changed: Another medication with the same name was  removed. Continue taking this medication, and follow the directions you see here. Changed by: Naveyah Iacovelli X Rylee Huestis, NP   aspirin 81 MG chewable tablet Chew by mouth daily. What changed: Another medication with the same name was removed. Continue taking this medication, and follow the directions you see here. Changed by: Roniqua Kintz X Jasmina Gendron, NP   atorvastatin 10 MG tablet Commonly known as: LIPITOR Take 10 mg by mouth daily. 7pm-10pm   carbidopa-levodopa 25-100 MG tablet Commonly known as: SINEMET IR Take 2 tablets by mouth 4 (four) times daily. 7am,12pm,4pm,9:30pm   donepezil 5 MG tablet Commonly known as: ARICEPT Take 5 mg by mouth at  bedtime. For parkinson's disease   HM Lidocaine Patch 4 % Ptch Generic drug: Lidocaine Apply topically. Once a day   Metoprolol Succinate 25 MG Cs24 Take 25 mg by mouth daily. 7am-10am   rOPINIRole 0.25 MG tablet Commonly known as: REQUIP Take 0.25 mg by mouth at bedtime. 7pm-10pm.   sennosides-docusate sodium 8.6-50 MG tablet Commonly known as: SENOKOT-S Take 1 tablet by mouth every other day. 8pm.   sertraline 25 MG tablet Commonly known as: ZOLOFT Take 25 mg by mouth daily.       Review of Systems  Constitutional: Negative for activity change, appetite change, fatigue, fever and unexpected weight change.  HENT: Negative for congestion and voice change.   Eyes: Negative for visual disturbance.  Respiratory: Positive for cough. Negative for shortness of breath.        Chronic cough with eating.   Cardiovascular: Negative for chest pain and leg swelling.  Gastrointestinal: Positive for constipation. Negative for abdominal distention and abdominal pain.  Genitourinary: Negative for difficulty urinating, dysuria and urgency.  Musculoskeletal: Positive for gait problem. Negative for neck pain.  Skin: Negative for color change.  Neurological: Positive for tremors. Negative for syncope, facial asymmetry, speech difficulty, light-headedness, numbness and headaches.       Memory lapses. Mild muscle rigidity in all limbs.   Psychiatric/Behavioral: Negative for agitation, behavioral problems and sleep disturbance. The patient is not nervous/anxious.     Immunization History  Administered Date(s) Administered  . Influenza Whole 06/20/2018  . Influenza, High Dose Seasonal PF 06/20/2019  . Influenza-Unspecified 07/08/2017  . Moderna SARS-COVID-2 Vaccination 09/19/2019, 10/17/2019  . PFIZER SARS-COV-2 Vaccination 09/19/2019, 10/17/2019  . PPD Test 01/15/2017  . Pneumococcal Polysaccharide-23 08/05/2018  . Td 09/17/2016   Pertinent  Health Maintenance Due  Topic Date Due  .  URINE MICROALBUMIN  Never done  . COLONOSCOPY  Never done  . OPHTHALMOLOGY EXAM  10/08/2017  . FOOT EXAM  01/21/2018  . PNA vac Low Risk Adult (1 of 2 - PCV13) 08/06/2019  . HEMOGLOBIN A1C  04/12/2020  . INFLUENZA VACCINE  Completed   Fall Risk  01/21/2017  Falls in the past year? Yes  Number falls in past yr: 2 or more  Injury with Fall? Yes  Risk Factor Category  High Fall Risk  Risk for fall due to : History of fall(s);Impaired balance/gait;Impaired mobility  Follow up Falls evaluation completed   Functional Status Survey:    Vitals:   12/09/19 0914  BP: 120/68  Pulse: (!) 58  Resp: 18  Temp: (!) 97.4 F (36.3 C)  SpO2: 95%  Weight: 225 lb 1.6 oz (102.1 kg)  Height: '5\' 11"'$  (1.803 m)   Body mass index is 31.4 kg/m. Physical Exam Vitals and nursing note reviewed.  Constitutional:      General: He is not in acute distress.  Appearance: Normal appearance. He is not ill-appearing.  HENT:     Head: Normocephalic.     Nose: Nose normal.     Mouth/Throat:     Mouth: Mucous membranes are dry.  Eyes:     Extraocular Movements: Extraocular movements intact.     Conjunctiva/sclera: Conjunctivae normal.     Pupils: Pupils are equal, round, and reactive to light.     Comments: Downward gaze  Neck:     Comments: Pain with Leftward and forward motion Cardiovascular:     Rate and Rhythm: Normal rate and regular rhythm.     Heart sounds: No murmur.  Pulmonary:     Breath sounds: No wheezing, rhonchi or rales.  Abdominal:     General: Bowel sounds are normal. There is no distension.     Palpations: Abdomen is soft.     Tenderness: There is no abdominal tenderness. There is no right CVA tenderness, left CVA tenderness, guarding or rebound.  Musculoskeletal:        General: No tenderness.     Cervical back: Normal range of motion and neck supple. No rigidity or tenderness.     Right lower leg: No edema.     Left lower leg: No edema.  Skin:    General: Skin is warm and  dry.     Findings: No bruising.  Neurological:     General: No focal deficit present.     Mental Status: He is alert. Mental status is at baseline.     Motor: No weakness.     Coordination: Coordination abnormal.     Gait: Gait abnormal.     Comments: Oriented to person, place. Unsteady/gait disorder/frequent falling. Moves slow. Tremor in fingers.   Psychiatric:        Mood and Affect: Mood normal.        Behavior: Behavior normal.     Comments: Smiled, conversing.      Labs reviewed: Recent Labs    10/14/19 0000 11/10/19 0000 11/12/19 0000  NA 141 140 140  K 3.8 4.2 4.2  CL 106 106 105  CO2 24* 27* 29*  BUN '14 11 9  '$ CREATININE 0.7 0.8 0.7  CALCIUM 8.6* 8.7 8.9   Recent Labs    05/28/19 0000 05/28/19 0000 06/30/19 0000 11/10/19 0000 11/12/19 0000  AST 10*   < > 8* 11* 10*  ALT 4*   < > 4* 11 12  ALKPHOS 94   < > 116 95 106  PROT 5.9  --   --   --   --   ALBUMIN 3.7  --   --  3.7 4.1   < > = values in this interval not displayed.   Recent Labs    06/30/19 0000 06/30/19 0000 10/14/19 0000 11/10/19 0000 11/12/19 0000  WBC 6.4   < > 6.6 6.2 6.8  NEUTROABS 3,514  --   --  3,615 4,522  HGB 12.9*   < > 12.5* 13.4* 14.2  HCT 40*   < > 38* 42 44  PLT 209   < > 219 225 252   < > = values in this interval not displayed.   Lab Results  Component Value Date   TSH 2.51 03/27/2019   Lab Results  Component Value Date   HGBA1C 5.8 10/14/2019   Lab Results  Component Value Date   CHOL 27 10/14/2019   HDL 27 (A) 10/14/2019   LDLCALC 43 10/14/2019   TRIG 134 10/14/2019  CHOLHDL 3.1 08/04/2018    Significant Diagnostic Results in last 30 days:  No results found.  Assessment/Plan Dysphagia Started pudding thick liquid 12/03/19, decreased oral fluid intake, dry mouth and sunken eyes noted, reported constipation as well, will encourage oral fluid intake, update CBC/diff, CMP/eGFR, risk for dehydration.   Constipation Will increased Senokot S to I qd/qod,  encourage oral fluid intake will be helpful, observe.   Cognitive impairment Continue SN[facility] for safety, care assistance, continue Donepezil.   Anxiety His mood is stabilizing, continue Sertraline.   Progressive supranuclear palsy (HCC) Progressing, frequent falling, continue Sinemet, Requip.   Diabetes mellitus type 2, controlled ([place]) 11/11/19 HPOA declined colonoscopy, eye exam, foot exam, no heroic measures.    Hypertension Blood pressure is controlled, continue Metoprolol.   Neck pain Improved, continue Tylenol.      Family/ staff Communication: plan of care reviewed with the patient and charge nurse.   Labs/tests ordered:  CBC/diff, CMP/eGFR  Time spend 25 minutes.

## 2019-12-09 NOTE — Assessment & Plan Note (Signed)
Blood pressure is controlled, continue Metoprolol. 

## 2019-12-10 LAB — BASIC METABOLIC PANEL
BUN: 11 (ref 4–21)
CO2: 29 — AB (ref 13–22)
Chloride: 104 (ref 99–108)
Creatinine: 0.8 (ref 0.6–1.3)
Glucose: 109
Potassium: 4.1 (ref 3.4–5.3)
Sodium: 141 (ref 137–147)

## 2019-12-10 LAB — COMPREHENSIVE METABOLIC PANEL
Albumin: 3.9 (ref 3.5–5.0)
Calcium: 9.1 (ref 8.7–10.7)
Globulin: 2.6

## 2019-12-10 LAB — CBC AND DIFFERENTIAL
HCT: 45 (ref 41–53)
Hemoglobin: 14.3 (ref 13.5–17.5)
Neutrophils Absolute: 3636
Platelets: 243 (ref 150–399)
WBC: 6.1

## 2019-12-10 LAB — HEPATIC FUNCTION PANEL
ALT: 16 (ref 10–40)
AST: 15 (ref 14–40)
Alkaline Phosphatase: 89 (ref 25–125)
Bilirubin, Total: 0.8

## 2019-12-10 LAB — CBC: RBC: 4.97 (ref 3.87–5.11)

## 2019-12-11 ENCOUNTER — Non-Acute Institutional Stay (SKILLED_NURSING_FACILITY): Payer: Medicare Other | Admitting: Nurse Practitioner

## 2019-12-11 ENCOUNTER — Encounter: Payer: Self-pay | Admitting: Nurse Practitioner

## 2019-12-11 DIAGNOSIS — I10 Essential (primary) hypertension: Secondary | ICD-10-CM

## 2019-12-11 DIAGNOSIS — G231 Progressive supranuclear ophthalmoplegia [Steele-Richardson-Olszewski]: Secondary | ICD-10-CM | POA: Diagnosis not present

## 2019-12-11 DIAGNOSIS — R296 Repeated falls: Secondary | ICD-10-CM | POA: Diagnosis not present

## 2019-12-11 DIAGNOSIS — F419 Anxiety disorder, unspecified: Secondary | ICD-10-CM | POA: Diagnosis not present

## 2019-12-11 DIAGNOSIS — K5900 Constipation, unspecified: Secondary | ICD-10-CM

## 2019-12-11 DIAGNOSIS — M542 Cervicalgia: Secondary | ICD-10-CM

## 2019-12-11 DIAGNOSIS — R4189 Other symptoms and signs involving cognitive functions and awareness: Secondary | ICD-10-CM

## 2019-12-11 NOTE — Progress Notes (Signed)
Location:   [facility] Room Number: 4 Place of Service:  SNF (31) Provider:  Jahlisa Rossitto NP  [name], MD  Patient Care Team: [name], MD as PCP - General (Internal Medicine) Emy Angevine X, NP as Nurse Practitioner (Internal Medicine)  Extended Emergency Contact Information Primary Emergency Contact: Bringhurst,Frank Address: [place], [place] [address] [address] of [place] Phone: [phone] Mobile Phone: [phone] Relation: Brother  Code Status:  DNR Goals of care: Advanced Directive information Advanced Directives 12/11/2019  Does Patient Have a Medical Advance Directive? Yes  Type of Advance Directive [place]  Does patient want to make changes to medical advance directive? No - Patient declined  Copy of [place] in Chart? Yes - validated most recent copy scanned in chart (See row information)  Would patient like information on creating a medical advance directive? -  Pre-existing out of facility DNR order (yellow form or pink MOST form) Yellow form placed in chart (order not valid for inpatient use)     Chief Complaint  Patient presents with  . Acute Visit    Fall    HPI:  Pt is a 64 y.o. male seen today for an acute visit for fall, reported fall 12/10/19 when the patient was sitting on bottom, leaning to the right side, the patient stated he attempted to go to bathroom, c/o R hip pain. The patient denied pain in the right hip. Hx of frequent falling, contributory factors are unsteady gait, poor safety awareness. Hx of dementia, resides in SNF [facility] for safety, care assistance, needs assistance for transfer, on Donepezil for memory, his mood is stable, on Sertraline '25mg'$  qd. PSP, progressing, on Sinemet, Requip, OA pain, in neck, stable, on Tylenol. HTN, loose controlled blood pressure, on Metoprolol '25mg'$  qd. Constipation, stable, on Senokot S qod.     Past Medical History:    Diagnosis Date  . Adult onset fluency disorder   . Coronary artery disease    a. s/p stent left anterior descending artery remotely (per 2017 note, "about 4 years" prior). b. Normal nuc 05/2015.  . Diabetes mellitus type 2, controlled ([place]) 01/18/2016  . Dysphagia 2018  . Gait instability 2018  . History of falling   . Hypercholesterolemia   . Hypertension   . Memory change 2018  . Parkinson's disease ([place]) 2018  . Weight loss 2017   intentional   Past Surgical History:  Procedure Laterality Date  . CAROTID STENT  2012   in left anterior descending artery past EF 55%  . nuclear stress test  06/17/2015   Gated SPECT images reveal normal LV systolic function. EF 55%. Comparing the rest & stress SPECT images, no ischemia. Gated images are normal. Dr. [name], MD [name], [name]     Allergies  Allergen Reactions  . Codeine     Unknown     Allergies as of 12/11/2019      Reactions   Codeine    Unknown       Medication List       Accurate as of December 11, 2019  4:29 PM. If you have any questions, ask your nurse or doctor.        acetaminophen 325 MG tablet Commonly known as: TYLENOL Take 650 mg by mouth in the morning and at bedtime.   aspirin 81 MG chewable tablet Chew by mouth daily.   atorvastatin  10 MG tablet Commonly known as: LIPITOR Take 10 mg by mouth daily. 7pm-10pm   carbidopa-levodopa 25-100 MG tablet Commonly known as: SINEMET IR Take 2 tablets by mouth 4 (four) times daily. 7am,12pm,4pm,9:30pm   donepezil 5 MG tablet Commonly known as: ARICEPT Take 5 mg by mouth at bedtime. For parkinson's disease   HM Lidocaine Patch 4 % Ptch Generic drug: Lidocaine Apply topically. Once a day   Metoprolol Succinate 25 MG Cs24 Take 25 mg by mouth daily. 7am-10am   rOPINIRole 0.25 MG tablet Commonly known as: REQUIP Take 0.25 mg by mouth at bedtime. 7pm-10pm.   sennosides-docusate sodium 8.6-50 MG tablet Commonly known as: SENOKOT-S Take 1 tablet by  mouth every other day. 8pm.   sertraline 25 MG tablet Commonly known as: ZOLOFT Take 25 mg by mouth daily.       Review of Systems  Constitutional: Negative for activity change, appetite change, fatigue and fever.  HENT: Negative for congestion and voice change.   Eyes: Negative for visual disturbance.  Respiratory: Positive for cough. Negative for shortness of breath.        Chronic cough with eating.   Cardiovascular: Negative for chest pain and leg swelling.  Gastrointestinal: Negative for abdominal distention, abdominal pain and constipation.  Genitourinary: Negative for difficulty urinating, dysuria and urgency.  Musculoskeletal: Positive for gait problem. Negative for neck pain.  Skin: Negative for color change.  Neurological: Positive for tremors. Negative for syncope, facial asymmetry, light-headedness, numbness and headaches.       Memory lapses. Mild muscle rigidity in all limbs.   Psychiatric/Behavioral: Negative for agitation, behavioral problems and sleep disturbance. The patient is not nervous/anxious.     Immunization History  Administered Date(s) Administered  . Influenza Whole 06/20/2018  . Influenza, High Dose Seasonal PF 06/20/2019  . Influenza-Unspecified 07/08/2017  . Moderna SARS-COVID-2 Vaccination 09/19/2019, 10/17/2019  . PFIZER SARS-COV-2 Vaccination 09/19/2019, 10/17/2019  . PPD Test 01/15/2017  . Pneumococcal Polysaccharide-23 08/05/2018  . Td 09/17/2016   Pertinent  Health Maintenance Due  Topic Date Due  . URINE MICROALBUMIN  Never done  . COLONOSCOPY  Never done  . OPHTHALMOLOGY EXAM  10/08/2017  . FOOT EXAM  01/21/2018  . PNA vac Low Risk Adult (1 of 2 - PCV13) 08/06/2019  . HEMOGLOBIN A1C  04/12/2020  . INFLUENZA VACCINE  Completed   Fall Risk  01/21/2017  Falls in the past year? Yes  Number falls in past yr: 2 or more  Injury with Fall? Yes  Risk Factor Category  High Fall Risk  Risk for fall due to : History of fall(s);Impaired  balance/gait;Impaired mobility  Follow up Falls evaluation completed   Functional Status Survey:    Vitals:   12/11/19 1236  BP: 132/70  Pulse: (!) 58  Resp: 18  Temp: 97.6 F (36.4 C)  SpO2: 94%  Weight: 225 lb 1.6 oz (102.1 kg)  Height: '5\' 11"'$  (1.803 m)   Body mass index is 31.4 kg/m. Physical Exam Vitals and nursing note reviewed.  Constitutional:      General: He is not in acute distress.    Appearance: Normal appearance. He is not ill-appearing.  HENT:     Head: Normocephalic.     Nose: Nose normal.     Mouth/Throat:     Mouth: Mucous membranes are moist.  Eyes:     Extraocular Movements: Extraocular movements intact.     Conjunctiva/sclera: Conjunctivae normal.     Pupils: Pupils are equal, round, and reactive to light.  Comments: Downward gaze  Neck:     Comments: Pain with Leftward and forward motion Cardiovascular:     Rate and Rhythm: Normal rate and regular rhythm.     Heart sounds: No murmur.  Pulmonary:     Breath sounds: No wheezing, rhonchi or rales.  Abdominal:     General: Bowel sounds are normal. There is no distension.     Palpations: Abdomen is soft.     Tenderness: There is no abdominal tenderness. There is no right CVA tenderness or left CVA tenderness.  Musculoskeletal:     Cervical back: Normal range of motion and neck supple. No rigidity or tenderness.     Right lower leg: No edema.     Left lower leg: No edema.  Skin:    General: Skin is warm and dry.  Neurological:     General: No focal deficit present.     Mental Status: He is alert. Mental status is at baseline.     Motor: No weakness.     Coordination: Coordination abnormal.     Gait: Gait abnormal.     Comments: Oriented to person, place. Unsteady/gait disorder/frequent falling. Moves slow. Tremor in fingers.   Psychiatric:        Mood and Affect: Mood normal.        Behavior: Behavior normal.     Comments: Smiled, conversing.      Labs reviewed: Recent Labs     11/10/19 0000 11/12/19 0000 12/10/19 0000  NA 140 140 141  K 4.2 4.2 4.1  CL 106 105 104  CO2 27* 29* 29*  BUN '11 9 11  '$ CREATININE 0.8 0.7 0.8  CALCIUM 8.7 8.9 9.1   Recent Labs    05/28/19 0000 06/30/19 0000 11/10/19 0000 11/12/19 0000 12/10/19 0000  AST 10*   < > 11* 10* 15  ALT 4*   < > '11 12 16  '$ ALKPHOS 94   < > 95 106 89  PROT 5.9  --   --   --   --   ALBUMIN 3.7  --  3.7 4.1 3.9   < > = values in this interval not displayed.   Recent Labs    11/10/19 0000 11/12/19 0000 12/10/19 0000  WBC 6.2 6.8 6.1  NEUTROABS 3,615 4,522 3,636  HGB 13.4* 14.2 14.3  HCT 42 44 45  PLT 225 252 243   Lab Results  Component Value Date   TSH 2.51 03/27/2019   Lab Results  Component Value Date   HGBA1C 5.8 10/14/2019   Lab Results  Component Value Date   CHOL 27 10/14/2019   HDL 27 (A) 10/14/2019   LDLCALC 43 10/14/2019   TRIG 134 10/14/2019   CHOLHDL 3.1 08/04/2018    Significant Diagnostic Results in last 30 days:  No results found.  Assessment/Plan Recurrent falls Reported fall 12/10/19 when the patient was sitting on bottom, leaning to the right side, the patient stated he attempted to go to bathroom, c/o R hip pain, no pain upon my examination, observe Contributory factors of falling: poor safety awareness, unsteady gait, close supervision/assistance needed.  12/10/19 wbc 6.1, Hgb 14.3, plt 243, neutrophils 59.6, Na 141, K 4.1, Bun 11, creat 0.75, eGFR 96  Hypertension Blood pressure is controlled, continue Metoprolol   Progressive supranuclear palsy (HCC) Progressing, continue Sinemet, Requip  Anxiety His mood is stable, continue Sertraline.   Cognitive impairment Continue Donepezil, continue SNF [facility] for safety, care assistance.   Constipation Stable, continue Senokot S  Neck pain Controlled, continue Tylenol.     Family/ staff Communication: plan of care reviewed with the patient and charge nurse.   Labs/tests ordered:  none  Time spend 25  minutes.

## 2019-12-11 NOTE — Assessment & Plan Note (Signed)
Stable, continue Senokot S 

## 2019-12-11 NOTE — Assessment & Plan Note (Signed)
Blood pressure is controlled, continue Metoprolol. 

## 2019-12-11 NOTE — Assessment & Plan Note (Signed)
Progressing, continue Sinemet, Requip

## 2019-12-11 NOTE — Assessment & Plan Note (Signed)
Continue Donepezil, continue SNF [facility] for safety, care assistance.

## 2019-12-11 NOTE — Assessment & Plan Note (Signed)
His mood is stable, continue Sertraline.  

## 2019-12-11 NOTE — Assessment & Plan Note (Addendum)
Reported fall 12/10/19 when the patient was sitting on bottom, leaning to the right side, the patient stated he attempted to go to bathroom, c/o R hip pain, no pain upon my examination, observe Contributory factors of falling: poor safety awareness, unsteady gait, close supervision/assistance needed.  12/10/19 wbc 6.1, Hgb 14.3, plt 243, neutrophils 59.6, Na 141, K 4.1, Bun 11, creat 0.75, eGFR 96

## 2019-12-11 NOTE — Assessment & Plan Note (Signed)
Controlled, continue Tylenol.

## 2020-01-16 DEATH — deceased

## 2022-01-18 IMAGING — DX DG LUMBAR SPINE COMPLETE 4+V
5 series · 5 of 5 positions shown · non-contrast
Comparison: None.

CLINICAL DATA: Recent fall with low back pain, initial encounter

EXAM:
LUMBAR SPINE - COMPLETE 4+ VIEW

[l-spine ap]
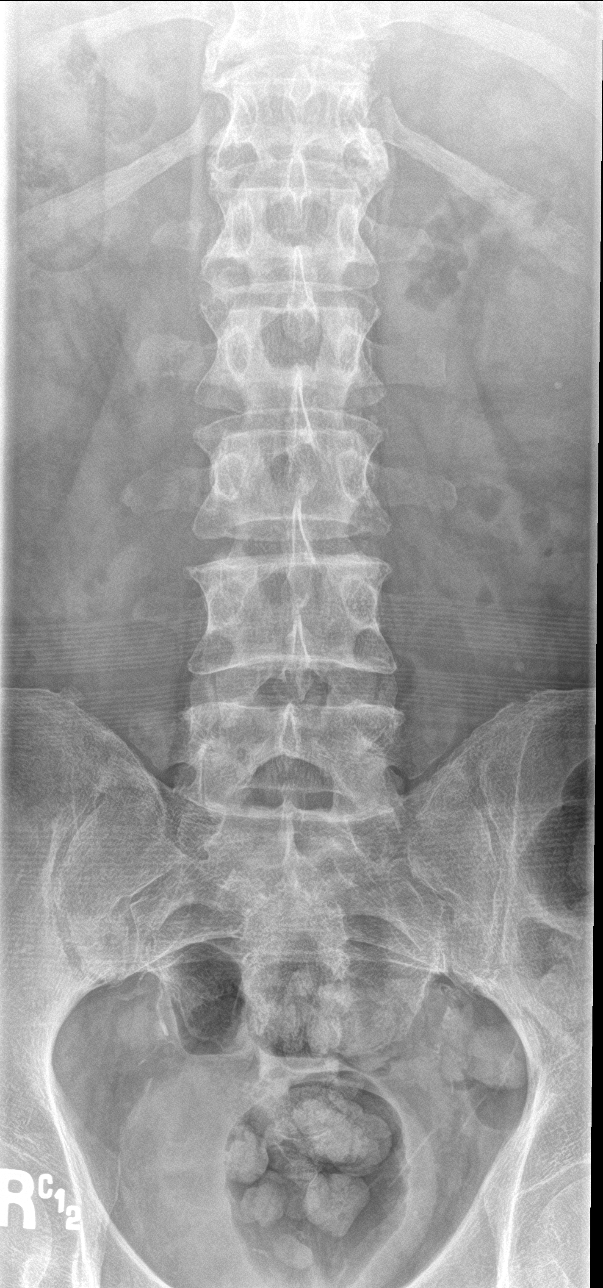

[l-spine obl (1 of 2)]
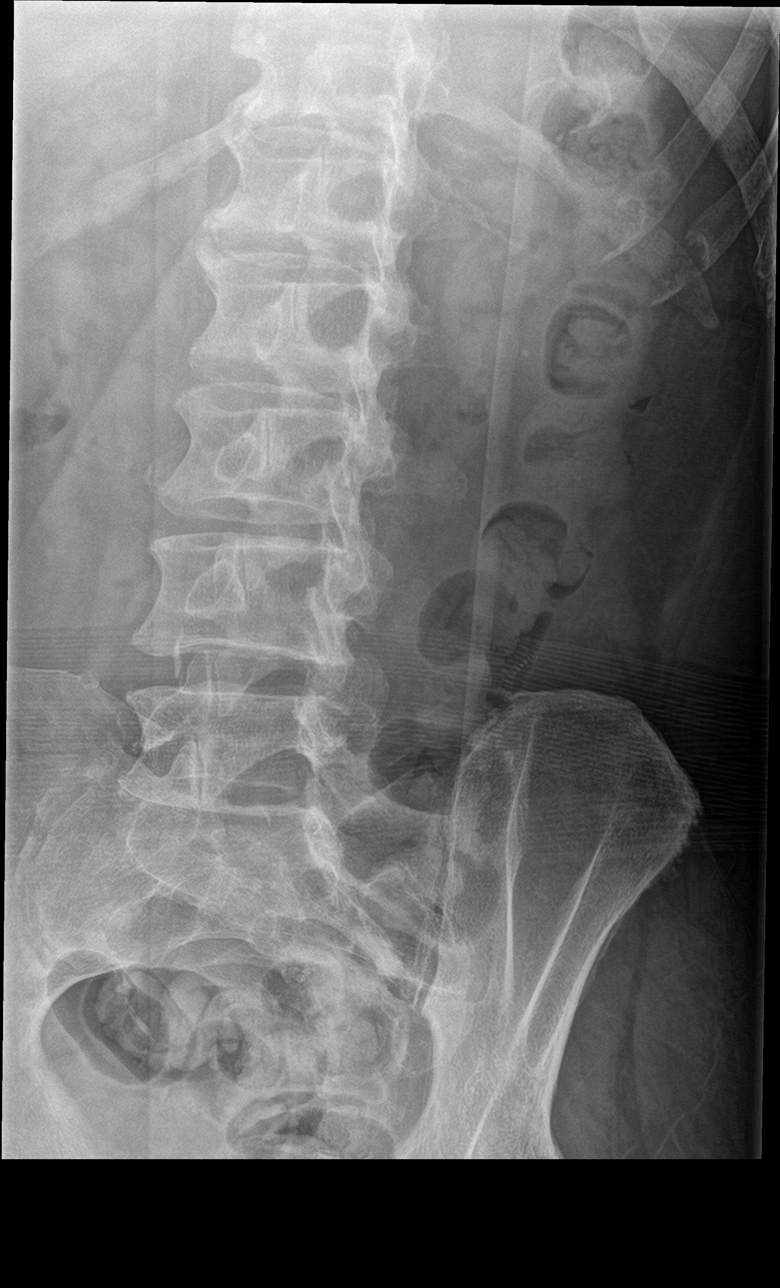

[l-spine obl (2 of 2)]
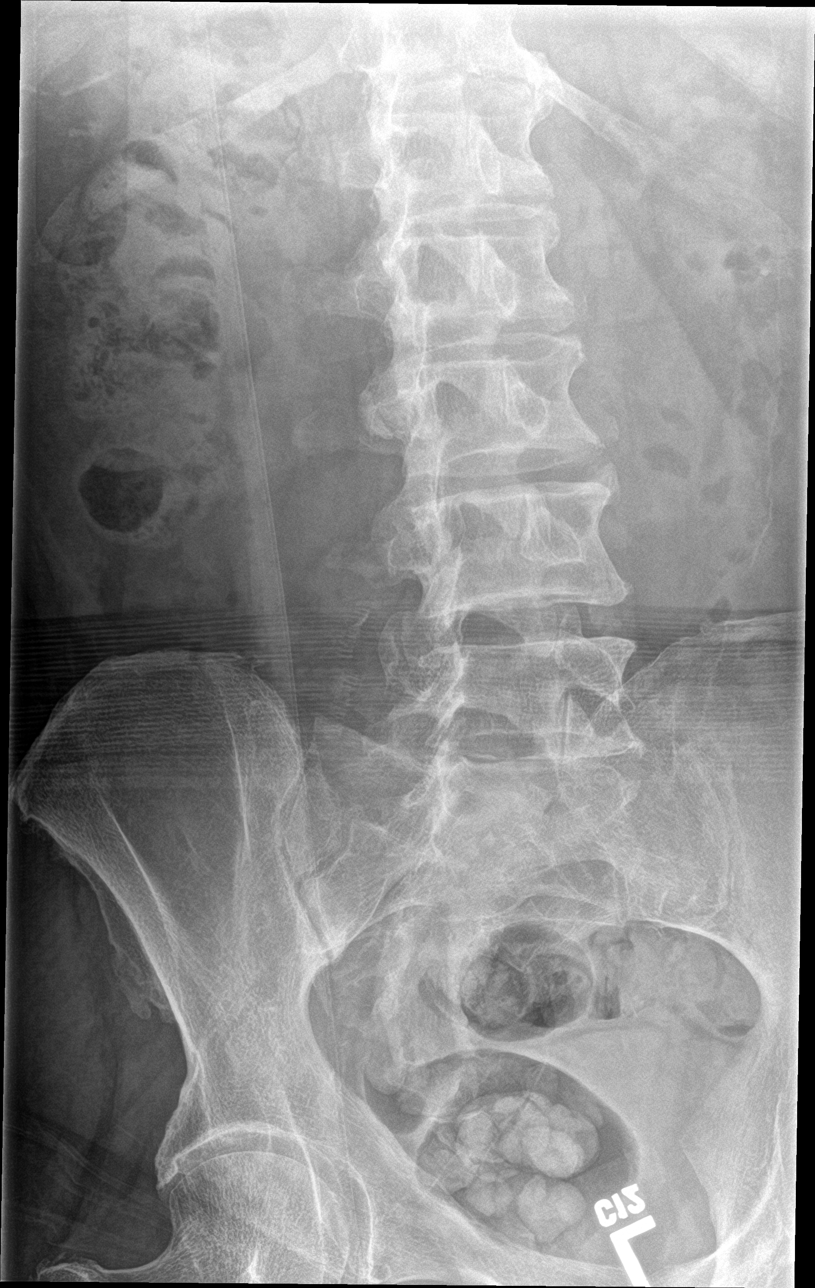

[l-spine lat]
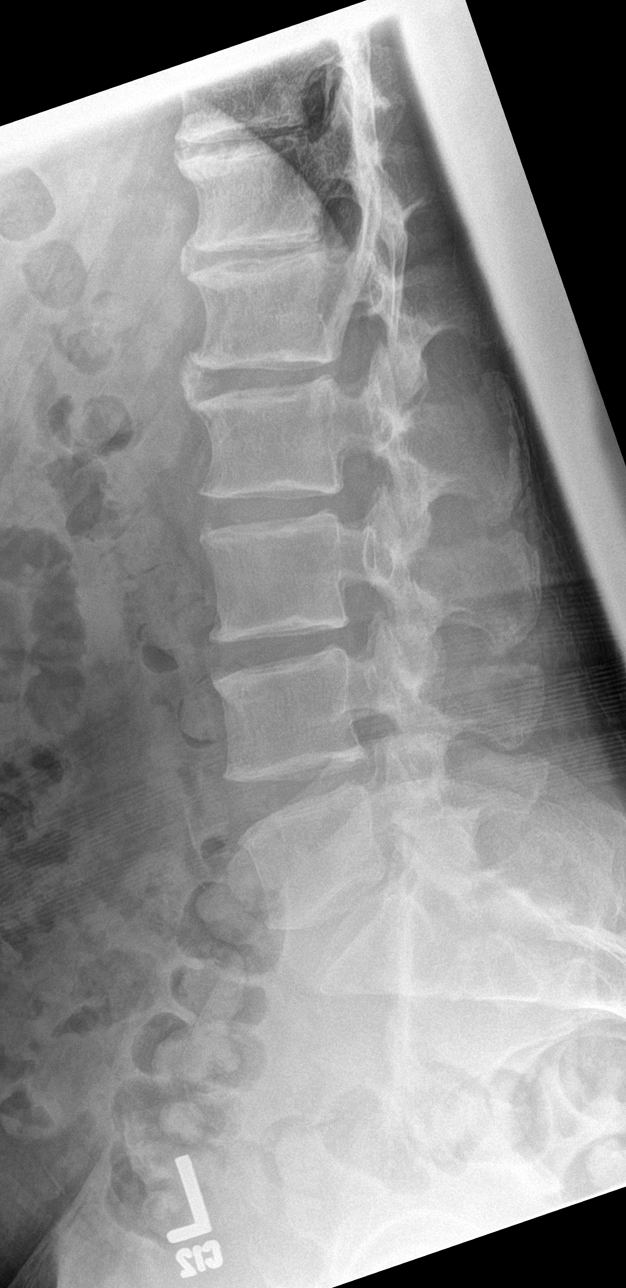

[l-spine spot]
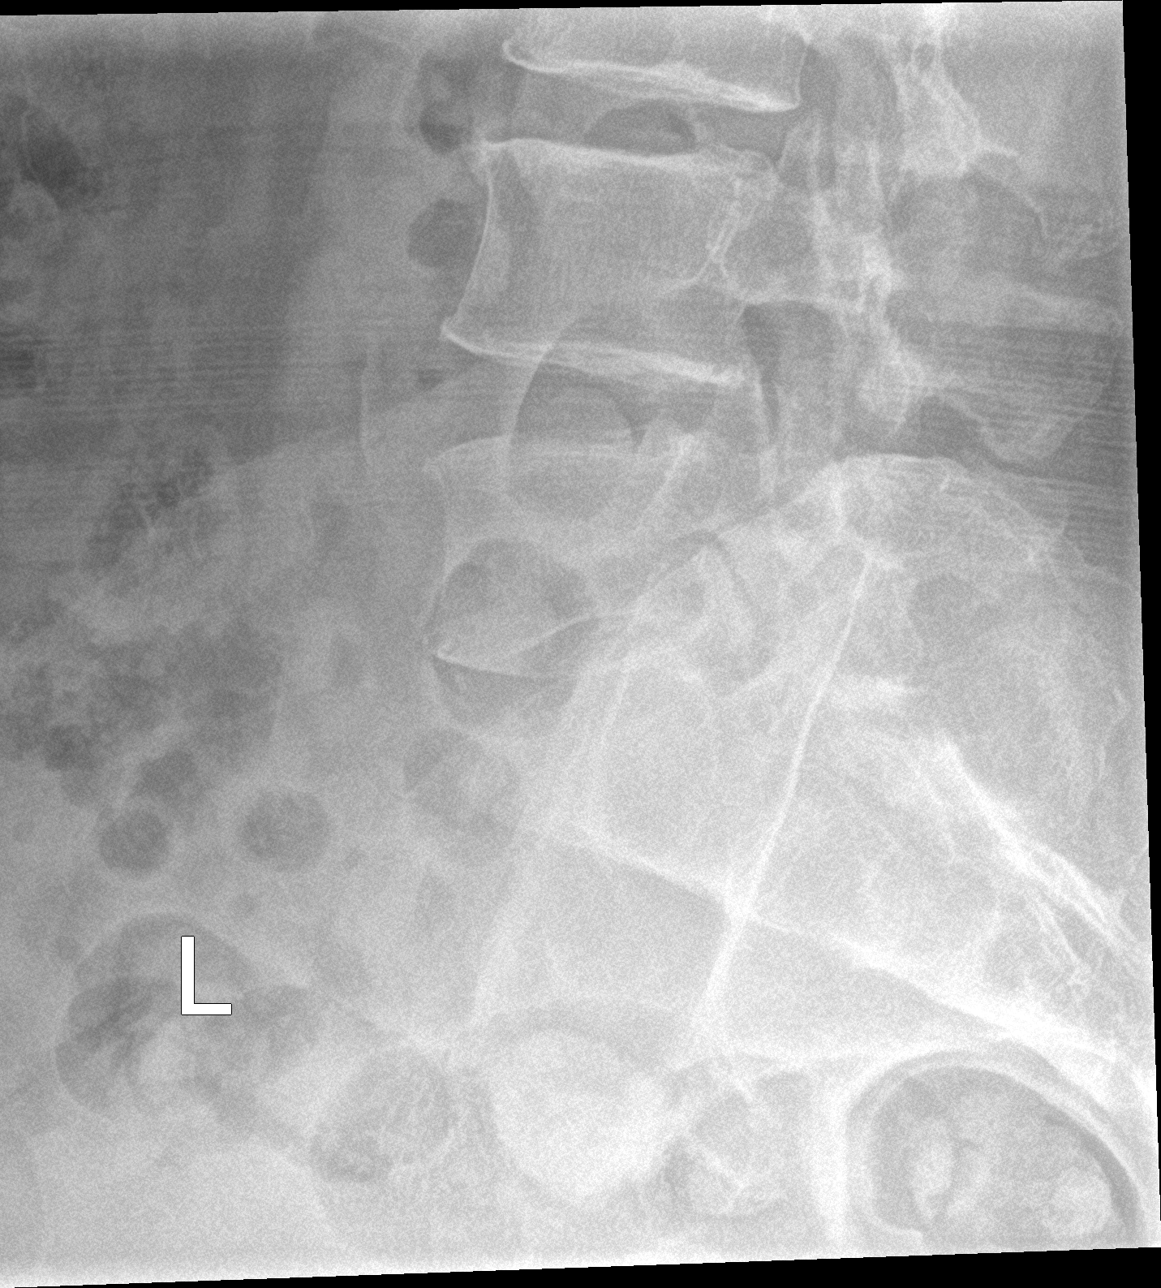

[5 of 5 positions shown; findings below may reference images not displayed]

FINDINGS: Five lumbar type vertebral bodies are well visualized. Vertebral
body height is well maintained. There is suspicion of fractures
involving the second and third transverse processes on right best
noted on the frontal film. Mild facet hypertrophic changes are seen.
Osteophytic changes are noted in the upper lumbar spine. No acute
soft tissue abnormality is seen. Nonobstructing left renal stone is
noted.
IMPRESSION: Changes suspicious for L2 and L3 transverse process fractures.

Tiny lower pole left renal stone.
